# Patient Record
Sex: Female | Born: 1945 | Race: Black or African American | Hispanic: No | Marital: Single | Smoking: Never smoker
Health system: Southern US, Community
[De-identification: ages and names within clinical notes are randomized; demographics above are authoritative.]

## PROBLEM LIST (undated history)

## (undated) DIAGNOSIS — D649 Anemia, unspecified: Secondary | ICD-10-CM

## (undated) DIAGNOSIS — E785 Hyperlipidemia, unspecified: Secondary | ICD-10-CM

## (undated) DIAGNOSIS — G5600 Carpal tunnel syndrome, unspecified upper limb: Secondary | ICD-10-CM

## (undated) DIAGNOSIS — K3184 Gastroparesis: Secondary | ICD-10-CM

## (undated) DIAGNOSIS — N189 Chronic kidney disease, unspecified: Secondary | ICD-10-CM

## (undated) DIAGNOSIS — K8020 Calculus of gallbladder without cholecystitis without obstruction: Secondary | ICD-10-CM

## (undated) DIAGNOSIS — I10 Essential (primary) hypertension: Secondary | ICD-10-CM

## (undated) DIAGNOSIS — I739 Peripheral vascular disease, unspecified: Secondary | ICD-10-CM

## (undated) DIAGNOSIS — M719 Bursopathy, unspecified: Secondary | ICD-10-CM

## (undated) DIAGNOSIS — K56609 Unspecified intestinal obstruction, unspecified as to partial versus complete obstruction: Secondary | ICD-10-CM

## (undated) DIAGNOSIS — M6740 Ganglion, unspecified site: Secondary | ICD-10-CM

## (undated) DIAGNOSIS — I2510 Atherosclerotic heart disease of native coronary artery without angina pectoris: Secondary | ICD-10-CM

## (undated) DIAGNOSIS — F419 Anxiety disorder, unspecified: Secondary | ICD-10-CM

## (undated) DIAGNOSIS — K219 Gastro-esophageal reflux disease without esophagitis: Secondary | ICD-10-CM

## (undated) DIAGNOSIS — G4730 Sleep apnea, unspecified: Secondary | ICD-10-CM

## (undated) DIAGNOSIS — E039 Hypothyroidism, unspecified: Secondary | ICD-10-CM

## (undated) DIAGNOSIS — K222 Esophageal obstruction: Secondary | ICD-10-CM

## (undated) DIAGNOSIS — M1990 Unspecified osteoarthritis, unspecified site: Secondary | ICD-10-CM

## (undated) HISTORY — PX: COLON RESECTION: SHX5231

## (undated) HISTORY — DX: Atherosclerotic heart disease of native coronary artery without angina pectoris: I25.10

## (undated) HISTORY — PX: UPPER GASTROINTESTINAL ENDOSCOPY: SHX188

## (undated) HISTORY — DX: Hyperlipidemia, unspecified: E78.5

## (undated) HISTORY — DX: Hypothyroidism, unspecified: E03.9

## (undated) HISTORY — PX: DILATION AND CURETTAGE OF UTERUS: SHX78

## (undated) HISTORY — PX: COLON SURGERY: SHX602

## (undated) HISTORY — DX: Chronic kidney disease, unspecified: N18.9

## (undated) HISTORY — DX: Essential (primary) hypertension: I10

## (undated) HISTORY — DX: Morbid (severe) obesity due to excess calories: E66.01

## (undated) HISTORY — DX: Bursopathy, unspecified: M71.9

## (undated) HISTORY — DX: Gastroparesis: K31.84

## (undated) HISTORY — DX: Gastro-esophageal reflux disease without esophagitis: K21.9

## (undated) HISTORY — DX: Calculus of gallbladder without cholecystitis without obstruction: K80.20

## (undated) HISTORY — DX: Anemia, unspecified: D64.9

## (undated) HISTORY — DX: Unspecified intestinal obstruction, unspecified as to partial versus complete obstruction: K56.609

## (undated) HISTORY — DX: Ganglion, unspecified site: M67.40

## (undated) HISTORY — PX: NISSEN FUNDOPLICATION: SHX2091

## (undated) HISTORY — PX: ABDOMINAL HYSTERECTOMY: SHX81

## (undated) HISTORY — DX: Esophageal obstruction: K22.2

## (undated) HISTORY — DX: Unspecified osteoarthritis, unspecified site: M19.90

## (undated) HISTORY — PX: HAND SURGERY: SHX662

## (undated) HISTORY — DX: Carpal tunnel syndrome, unspecified upper limb: G56.00

## (undated) HISTORY — DX: Peripheral vascular disease, unspecified: I73.9

## (undated) HISTORY — DX: Anxiety disorder, unspecified: F41.9

## (undated) HISTORY — DX: Sleep apnea, unspecified: G47.30

## (undated) HISTORY — PX: GASTROSTOMY W/ FEEDING TUBE: SUR642

## (undated) HISTORY — PX: OTHER SURGICAL HISTORY: SHX169

---

## 1995-05-02 DIAGNOSIS — K56609 Unspecified intestinal obstruction, unspecified as to partial versus complete obstruction: Secondary | ICD-10-CM

## 1995-05-02 HISTORY — DX: Unspecified intestinal obstruction, unspecified as to partial versus complete obstruction: K56.609

## 2002-01-07 ENCOUNTER — Ambulatory Visit (HOSPITAL_COMMUNITY): Admission: RE | Admit: 2002-01-07 | Discharge: 2002-01-07 | Payer: Self-pay | Admitting: Internal Medicine

## 2003-05-02 HISTORY — PX: COLON RESECTION: SHX5231

## 2010-12-13 DIAGNOSIS — R079 Chest pain, unspecified: Secondary | ICD-10-CM

## 2010-12-16 ENCOUNTER — Inpatient Hospital Stay (HOSPITAL_COMMUNITY)
Admission: AD | Admit: 2010-12-16 | Discharge: 2010-12-22 | DRG: 287 | Disposition: A | Discharge status: Home / Self Care | Payer: Medicare PPO | Source: Other Acute Inpatient Hospital | Attending: Cardiovascular Disease | Admitting: Cardiovascular Disease

## 2010-12-16 DIAGNOSIS — E039 Hypothyroidism, unspecified: Secondary | ICD-10-CM | POA: Diagnosis present

## 2010-12-16 DIAGNOSIS — Z794 Long term (current) use of insulin: Secondary | ICD-10-CM

## 2010-12-16 DIAGNOSIS — D649 Anemia, unspecified: Secondary | ICD-10-CM | POA: Diagnosis present

## 2010-12-16 DIAGNOSIS — R0789 Other chest pain: Principal | ICD-10-CM | POA: Diagnosis present

## 2010-12-16 DIAGNOSIS — E669 Obesity, unspecified: Secondary | ICD-10-CM | POA: Diagnosis present

## 2010-12-16 DIAGNOSIS — R9430 Abnormal result of cardiovascular function study, unspecified: Secondary | ICD-10-CM

## 2010-12-16 DIAGNOSIS — I2510 Atherosclerotic heart disease of native coronary artery without angina pectoris: Secondary | ICD-10-CM | POA: Diagnosis present

## 2010-12-16 DIAGNOSIS — Z933 Colostomy status: Secondary | ICD-10-CM

## 2010-12-16 DIAGNOSIS — E119 Type 2 diabetes mellitus without complications: Secondary | ICD-10-CM | POA: Diagnosis present

## 2010-12-16 DIAGNOSIS — E785 Hyperlipidemia, unspecified: Secondary | ICD-10-CM | POA: Diagnosis present

## 2010-12-16 DIAGNOSIS — R079 Chest pain, unspecified: Secondary | ICD-10-CM

## 2010-12-16 DIAGNOSIS — K259 Gastric ulcer, unspecified as acute or chronic, without hemorrhage or perforation: Secondary | ICD-10-CM | POA: Diagnosis present

## 2010-12-16 DIAGNOSIS — Z7982 Long term (current) use of aspirin: Secondary | ICD-10-CM

## 2010-12-16 DIAGNOSIS — Z79899 Other long term (current) drug therapy: Secondary | ICD-10-CM

## 2010-12-16 DIAGNOSIS — I10 Essential (primary) hypertension: Secondary | ICD-10-CM | POA: Diagnosis present

## 2010-12-16 LAB — CARDIAC PANEL(CRET KIN+CKTOT+MB+TROPI)
Relative Index: INVALID (ref 0.0–2.5)
Total CK: 78 U/L (ref 7–177)
Troponin I: <0.3 ng/mL (ref <0.30)

## 2010-12-16 LAB — GLUCOSE, CAPILLARY: Glucose-Capillary: 218 mg/dL — ABNORMAL HIGH (ref 70–99)

## 2010-12-17 DIAGNOSIS — I200 Unstable angina: Secondary | ICD-10-CM

## 2010-12-17 LAB — TSH: TSH: 0.17 u[IU]/mL — ABNORMAL LOW (ref 0.350–4.500)

## 2010-12-17 LAB — GLUCOSE, CAPILLARY: Glucose-Capillary: 158 mg/dL — ABNORMAL HIGH (ref 70–99)

## 2010-12-18 LAB — CBC
HCT: 25.5 % — ABNORMAL LOW (ref 36.0–46.0)
MCHC: 32.2 g/dL (ref 30.0–36.0)
Platelets: 183 10*3/uL (ref 150–400)
RDW: 13.6 % (ref 11.5–15.5)
WBC: 8.9 10*3/uL (ref 4.0–10.5)

## 2010-12-18 LAB — IRON AND TIBC
Iron: 64 ug/dL (ref 42–135)
Saturation Ratios: 15 % — ABNORMAL LOW (ref 20–55)
TIBC: 413 ug/dL (ref 250–470)

## 2010-12-18 LAB — PROTIME-INR
INR: 0.98 (ref 0.00–1.49)
Prothrombin Time: 13.2 s (ref 11.6–15.2)

## 2010-12-18 LAB — BASIC METABOLIC PANEL
CO2: 27 mEq/L (ref 19–32)
Calcium: 9.9 mg/dL (ref 8.4–10.5)
Creatinine, Ser: 1.16 mg/dL — ABNORMAL HIGH (ref 0.50–1.10)
GFR calc non Af Amer: 47 mL/min — ABNORMAL LOW (ref >60)
Sodium: 141 mEq/L (ref 135–145)

## 2010-12-18 LAB — VITAMIN B12: Vitamin B-12: 219 pg/mL (ref 211–911)

## 2010-12-18 LAB — GLUCOSE, CAPILLARY
Glucose-Capillary: 175 mg/dL — ABNORMAL HIGH (ref 70–99)
Glucose-Capillary: 135 mg/dL — ABNORMAL HIGH (ref 70–99)

## 2010-12-18 LAB — OCCULT BLOOD X 1 CARD TO LAB, STOOL: Fecal Occult Bld: POSITIVE

## 2010-12-19 DIAGNOSIS — I2510 Atherosclerotic heart disease of native coronary artery without angina pectoris: Secondary | ICD-10-CM

## 2010-12-19 DIAGNOSIS — R195 Other fecal abnormalities: Secondary | ICD-10-CM

## 2010-12-19 DIAGNOSIS — D509 Iron deficiency anemia, unspecified: Secondary | ICD-10-CM

## 2010-12-19 DIAGNOSIS — R1012 Left upper quadrant pain: Secondary | ICD-10-CM

## 2010-12-19 LAB — CBC
Hemoglobin: 8 g/dL — ABNORMAL LOW (ref 12.0–15.0)
MCH: 28.7 pg (ref 26.0–34.0)
MCHC: 31 g/dL (ref 30.0–36.0)
MCV: 92.5 fL (ref 78.0–100.0)
Platelets: 189 10*3/uL (ref 150–400)
RBC: 2.79 MIL/uL — ABNORMAL LOW (ref 3.87–5.11)

## 2010-12-19 LAB — GLUCOSE, CAPILLARY
Glucose-Capillary: 105 mg/dL — ABNORMAL HIGH (ref 70–99)
Glucose-Capillary: 109 mg/dL — ABNORMAL HIGH (ref 70–99)

## 2010-12-19 LAB — TYPE AND SCREEN: ABO/RH(D): O POS

## 2010-12-19 LAB — ABO/RH: ABO/RH(D): O POS

## 2010-12-20 DIAGNOSIS — K250 Acute gastric ulcer with hemorrhage: Secondary | ICD-10-CM

## 2010-12-20 LAB — GLUCOSE, CAPILLARY
Glucose-Capillary: 150 mg/dL — ABNORMAL HIGH (ref 70–99)
Glucose-Capillary: 155 mg/dL — ABNORMAL HIGH (ref 70–99)
Glucose-Capillary: 175 mg/dL — ABNORMAL HIGH (ref 70–99)
Glucose-Capillary: 143 mg/dL — ABNORMAL HIGH (ref 70–99)
Glucose-Capillary: 176 mg/dL — ABNORMAL HIGH (ref 70–99)

## 2010-12-20 LAB — CBC
MCH: 29.5 pg (ref 26.0–34.0)
MCHC: 32 g/dL (ref 30.0–36.0)
Platelets: 195 10*3/uL (ref 150–400)

## 2010-12-20 LAB — BASIC METABOLIC PANEL
CO2: 27 mEq/L (ref 19–32)
Calcium: 9.5 mg/dL (ref 8.4–10.5)
Chloride: 107 mEq/L (ref 96–112)
Potassium: 3.8 mEq/L (ref 3.5–5.1)
Sodium: 142 mEq/L (ref 135–145)

## 2010-12-20 NOTE — Cardiovascular Report (Signed)
  NAMEILSE, BILLMAN NO.:  [id number]  MEDICAL RECORD NO.:  [id number]  LOCATION:                                 FACILITY:  PHYSICIAN:  Rollene Rotunda, MD, FACCDATE OF BIRTH:  Jan 15, 1946  DATE OF PROCEDURE: DATE OF DISCHARGE:                           CARDIAC CATHETERIZATION   PRIMARY:  Kirstie Peri, MD  CARDIOLOGIST:  Lorine Bears, MD  REASON FOR PRESENTATION:  Evaluate the patient with chest discomfort. She did have an stress perfusion study that suggested possible anterior ischemia versus breast attenuation.  PROCEDURAL NOTE:  Left heart catheterization was performed via the right radial artery, the artery was cannulated using the wall puncture.  A 5- French arterial sheath was inserted via the Seldinger technique. Preformed Judkins pigtail catheter was utilized.  The patient tolerated the procedure and left the lab in stable condition.  RESULTS:  Hemodynamics:  LV 144/10, AO 141/64.  Coronaries:  Left main was normal.  The LAD had 60% proximal stenosis. There was apical diffuse disease.  First diagonal was moderate sized and normal.  The circumflex in the AV groove had mid to distal luminal irregularities.  The distal vessel then had high-grade disease following a second obtuse marginal.  First obtuse marginal was large and normal. Second obtuse marginal had ostial 25% stenosis.  The right coronary artery is a very large dominant vessel.  There are proximal 50% stenosis and luminal irregularities.  The PDA was large and normal.  Left ventriculogram:  Left ventriculogram was obtained in the RAO projection.  The EF was approximately 60% with normal wall motion.  CONCLUSION:  Moderate three-vessel coronary artery disease.  Normal left ventricular function.  PLAN:  The patient's pain is somewhat atypical.  There has been no objective evidence of ischemia.  Stress perfusion study suggested possible ischemia, but could not exclude attenuation.  She  is quite anemic with a drop in hemoglobin.  Guaiac of colostomy contents has been positive.  There were iron studies that are only minimally low. I will get GI to evaluate her as it is likely the pain could be GI etiology.  She is going to need aggressive risk reduction of her coronary artery disease and then we can reconsider reevaluation particularly of her LAD lesion in the future.     Rollene Rotunda, MD, [facility]     JH/MEDQ  D:  12/19/2010  T:  12/19/2010  Job:  [id number]  cc:   Kirstie Peri, MD Lorine Bears, MD  Electronically Signed by Rollene Rotunda MD [facility] on 12/20/2010 02:23:40 PM

## 2010-12-21 ENCOUNTER — Encounter: Payer: Self-pay | Admitting: Internal Medicine

## 2010-12-21 LAB — CBC
MCH: 29.2 pg (ref 26.0–34.0)
MCHC: 31.6 g/dL (ref 30.0–36.0)
MCV: 92.4 fL (ref 78.0–100.0)
Platelets: 236 10*3/uL (ref 150–400)
RBC: 2.91 MIL/uL — ABNORMAL LOW (ref 3.87–5.11)

## 2010-12-21 LAB — BASIC METABOLIC PANEL
BUN: 22 mg/dL (ref 6–23)
CO2: 27 mEq/L (ref 19–32)
Chloride: 103 mEq/L (ref 96–112)
Creatinine, Ser: 1.46 mg/dL — ABNORMAL HIGH (ref 0.50–1.10)
GFR calc Af Amer: 44 mL/min — ABNORMAL LOW (ref >60)

## 2010-12-21 LAB — GLUCOSE, CAPILLARY

## 2010-12-22 DIAGNOSIS — R079 Chest pain, unspecified: Secondary | ICD-10-CM

## 2010-12-22 LAB — CBC
HCT: 25.1 % — ABNORMAL LOW (ref 36.0–46.0)
Hemoglobin: 8 g/dL — ABNORMAL LOW (ref 12.0–15.0)
WBC: 7.4 10*3/uL (ref 4.0–10.5)

## 2010-12-22 LAB — GLUCOSE, CAPILLARY: Glucose-Capillary: 174 mg/dL — ABNORMAL HIGH (ref 70–99)

## 2011-01-11 ENCOUNTER — Other Ambulatory Visit: Payer: Self-pay | Admitting: *Deleted

## 2011-01-13 ENCOUNTER — Telehealth: Payer: Self-pay | Admitting: Internal Medicine

## 2011-01-24 ENCOUNTER — Ambulatory Visit (INDEPENDENT_AMBULATORY_CARE_PROVIDER_SITE_OTHER): Payer: Medicare PPO | Admitting: Internal Medicine

## 2011-01-24 ENCOUNTER — Encounter: Payer: Self-pay | Admitting: Internal Medicine

## 2011-01-24 VITALS — BP 124/62 | HR 60 | Ht 67.0 in | Wt 270.2 lb

## 2011-01-24 DIAGNOSIS — E119 Type 2 diabetes mellitus without complications: Secondary | ICD-10-CM

## 2011-01-24 DIAGNOSIS — D649 Anemia, unspecified: Secondary | ICD-10-CM

## 2011-01-24 DIAGNOSIS — K253 Acute gastric ulcer without hemorrhage or perforation: Secondary | ICD-10-CM

## 2011-01-24 NOTE — Progress Notes (Signed)
HISTORY OF PRESENT ILLNESS:  Jenna Diaz is a 65 y.o. female with multiple significant medical problems including morbid obesity, coronary artery disease, renal insufficiency, hyperlipidemia, hypothyroidism, hypertension, diabetes mellitus, and anemia. She presents today for post hospital followup. She was hospitalized between 12/16/2010 and 12/22/2010 regarding multifactorial chest pain. See the discharge summary. She is status post total abdominal colectomy elsewhere for unclear reasons. During her hospital stay, she complained of left upper quadrant abdominal pain. In addition to anemia, she had Hemoccult-positive stool. Ileoscopy was unremarkable. Upper endoscopy revealed a large antral ulcer (2 cm). Testing for Helicobacter pylori was negative. Her risk factor was chronic NSAID use. She was treated with pantoprazole 40 mg twice a day and avoidance of all NSAIDs, except baby aspirin for cardiac protection purposes. She has been compliant. Her abdominal pain has resolved. No active GI complaints. Multiple non-GI complaints. She saw her family position the other day. Review of outside laboratories from September 7 5 the patient to be anemic with a hemoglobin of 8.9. She was placed on iron supplement.  REVIEW OF SYSTEMS:  All non-GI ROS negative except for fatigue, exertional shortness of breath, sinus and allergy trouble, arthritis, back pain, headaches, itching, voice change, urinary frequency, ankle edema.  Past Medical History  Diagnosis Date  . Diabetes mellitus   . Essential hypertension   . Hypothyroidism   . Anemia   . GERD (gastroesophageal reflux disease)   . Hyperlipemia   . Potassium deficiency   . Bowel obstruction 1997  . Osteoarthritis   . Chronic kidney disease     questionable  . Gastric ulcer   . CAD (coronary artery disease)     Past Surgical History  Procedure Date  . Colon resection   . Abdominal hysterectomy   . Hand surgery     cyst on left hand  . Nissen  fundoplication     questionable - pt unsure of surgical name  . Gastrostomy w/ feeding tube     Social History Jenna Diaz  reports that she has never smoked. She has never used smokeless tobacco. She reports that she does not drink alcohol or use illicit drugs.  family history includes Diabetes in her sister; Esophageal cancer in her father; Heart disease in her brother, maternal grandmother, mother, and paternal grandmother; and Ovarian cancer in her sister.  No Known Allergies     PHYSICAL EXAMINATION:  Vital signs: BP 124/62  Pulse 60  Ht 5\' 7"  (1.702 m)  Wt 270 lb 3.2 oz (122.562 kg)  BMI 42.32 kg/m2 General: Obese, Well-developed, well-nourished, no acute distress HEENT: Sclerae are anicteric, conjunctiva pink. Oral mucosa intact Lungs: Clear Heart: Regular Abdomen: soft, obese, nontender, nondistended, no obvious ascites, no peritoneal signs, normal bowel sounds. No organomegaly. Ostomy unremarkable Extremities: No edema Psychiatric: alert and oriented x3. Cooperative    ASSESSMENT:  #1. Large gastric ulcer. Abdominal pain resolved on twice a day PPI. Still on low dose aspirin but no additional NSAIDs. H. pylori testing negative #2. Status post total abdominal colectomy, elsewhere. Reasons unclear. #3. Multiple significant medical problems including coronary artery disease, diabetes, and obesity #4. Anemia with associated symptoms   PLAN:  #1. Continue twice a day PPI. Nexium samples provided until she can pick up her recent pantoprazole prescription #2. Continue to avoid all NSAIDs except for baby aspirin #3. Schedule upper endoscopy to assess for ulcer healing in about 4 weeks. The patient is high-risk given her body habitus and comorbidities.The nature of the procedure, as  well as the risks, benefits, and alternatives were carefully and thoroughly reviewed with the patient. Ample time for discussion and questions allowed. The patient understood, was  satisfied, and agreed to proceed.  #4. Hold diabetic medications the morning of the exam until resuming by mouth intake #5. Sedation with propofol and CRNA supervision given her body habitus and comorbidities. She understands #6. Continue to work with your PCP on non-GI complaints #7. If anemia persists despite iron, consider hematology opinion. I will leave this to the patient's PCP, Dr. Sherryll Burger

## 2011-01-24 NOTE — Patient Instructions (Addendum)
EGD [facility] Propoful 02/21/11  9:30 am arrive at 8:30 am Hold Insulin the morning of procedure. Nexium samples given in office but continue Protonix when prescription is available.

## 2011-01-27 ENCOUNTER — Encounter: Payer: Self-pay | Admitting: Cardiovascular Disease

## 2011-01-27 ENCOUNTER — Ambulatory Visit (INDEPENDENT_AMBULATORY_CARE_PROVIDER_SITE_OTHER): Payer: Medicare PPO | Admitting: Physician Assistant

## 2011-01-27 VITALS — BP 144/76 | HR 56 | Ht 67.0 in | Wt 270.0 lb

## 2011-01-27 DIAGNOSIS — E039 Hypothyroidism, unspecified: Secondary | ICD-10-CM | POA: Insufficient documentation

## 2011-01-27 DIAGNOSIS — E785 Hyperlipidemia, unspecified: Secondary | ICD-10-CM | POA: Insufficient documentation

## 2011-01-27 DIAGNOSIS — I10 Essential (primary) hypertension: Secondary | ICD-10-CM | POA: Insufficient documentation

## 2011-01-27 DIAGNOSIS — E119 Type 2 diabetes mellitus without complications: Secondary | ICD-10-CM

## 2011-01-27 DIAGNOSIS — D649 Anemia, unspecified: Secondary | ICD-10-CM | POA: Insufficient documentation

## 2011-01-27 DIAGNOSIS — Z79899 Other long term (current) drug therapy: Secondary | ICD-10-CM

## 2011-01-27 DIAGNOSIS — I2510 Atherosclerotic heart disease of native coronary artery without angina pectoris: Secondary | ICD-10-CM

## 2011-01-27 DIAGNOSIS — E1159 Type 2 diabetes mellitus with other circulatory complications: Secondary | ICD-10-CM | POA: Insufficient documentation

## 2011-01-27 MED ORDER — AMLODIPINE BESYLATE 5 MG PO TABS: 5.0000 mg | ORAL_TABLET | Freq: Every day | ORAL | Status: DC

## 2011-01-27 MED ORDER — NITROGLYCERIN 0.4 MG SL SUBL: 0.4000 mg | SUBLINGUAL_TABLET | SUBLINGUAL | Status: DC | PRN

## 2011-01-27 NOTE — Assessment & Plan Note (Addendum)
Patient is still experiencing exertional angina, but less often than with recent presentation. Therefore, will add amlodipine 5 mg daily for anti-anginal, and antihypertensive, benefit. Will also provide prn nitroglycerin. Will schedule early return visit with Dr. Kirke Corin within a few weeks, with plans to repeat coronary angiography within the next few months, this time utilizing FFR for reassessment of LAD lesion. This is in reference to noted anterior wall ischemia on nuclear imaging.  Patient seen and examined in conjunction with Dr. Kirke Corin.

## 2011-01-27 NOTE — Assessment & Plan Note (Signed)
Followup with Dr. Yancey Flemings, as scheduled, including recommended repeat EGD for noted gastric ulcer. Recent followup CBC notable for hemoglobin 8.9, down from 9.9. Patient notes no BR BPR, but occasional dark stools. She is tolerating low-dose aspirin, which was approved by the GI team.

## 2011-01-27 NOTE — Patient Instructions (Signed)
Follow up as scheduled. Start Norvasc (amlodipine) 5 mg daily. Use Nitroglycerin as needed/as directed for chest pain. Your physician recommends that you go to the [facility] for lab work: FLP/LFT/TSH. Do not eat or drink after midnight. If the results of your test are normal or stable, you will receive a letter. If they are abnormal, the nurse will contact you by phone.

## 2011-01-27 NOTE — Assessment & Plan Note (Signed)
We'll order fasting lipid profile for reassessment of lipid status. Patient is currently on a statin. Recommended aggressive management with target LDL 70 or less, if feasible.

## 2011-01-27 NOTE — Assessment & Plan Note (Signed)
Patient had a TSH level of 0.17 at [facility]. Will repeat this level, then forward result to Dr. Sherryll Burger, for ongoing monitoring and management.

## 2011-01-27 NOTE — Progress Notes (Signed)
HPI: Patient presents for post hospital followup, and to establish here in our [place] clinic.  She was initially seen in consultation here at [facility], by Dr Kirke Corin, following presentation with CP. She R/O for MI, but had had a recent OP Cardiolite, reviewed by Dr Diona Browner, suggestive of anterior ischemia with NL LVF. She was referred for a catheterization, which yielded moderate 3v CAD; EF 60%, NL wall motion. Specifically, there was a 60% pLAD lesion, and a 50% pRCA stenosis.  She also was found to have guaic positive anemia, and was referred to GI. They proceeded with EGD/Colonoscopy, revealing an antral ulcer, but no overt bleeding. She was cleared to be treated with ASA 81 mg daily. Hemoglobin 8.0, at [facility], with f/u 8.9, here at [facility], per Dr Sherryll Burger.  Clinically, she continues to have exertional CP, but less often and less severe. She also continues to have "dark" stools, but no definite melena. She denies BRBPR. She also continues to experience DOE, which is not new, but no worsening of her chronic LE edema.  No Known Allergies  Current Outpatient Prescriptions on File Prior to Visit  Medication Sig Dispense Refill  . aspirin EC 81 MG tablet Take 81 mg by mouth daily.        . fluticasone-salmeterol (ADVAIR HFA) 115-21 MCG/ACT inhaler Inhale 2 puffs into the lungs 2 (two) times daily.        . furosemide (LASIX) 40 MG tablet Take by mouth. Take 1/2 tablet by mouth once daily as needed       . gabapentin (NEURONTIN) 100 MG capsule Take 100 mg by mouth 3 (three) times daily.        . insulin aspart (NOVOLOG FLEXPEN) 100 UNIT/ML injection Inject 12 Units into the skin 2 (two) times daily.        . insulin glargine (LANTUS) 100 UNIT/ML injection Inject into the skin at bedtime. Take 45 units every morning and 40 units every evening       . iron polysaccharides (NU-IRON) 150 MG capsule Take 150 mg by mouth daily.        Marland Kitchen levothyroxine (SYNTHROID, LEVOTHROID) 150 MCG tablet Take 150 mcg by mouth  daily.        Marland Kitchen lisinopril (PRINIVIL,ZESTRIL) 20 MG tablet Take 20 mg by mouth daily.        . pantoprazole (PROTONIX) 40 MG tablet Take 40 mg by mouth 2 (two) times daily.       . potassium chloride (KLOR-CON) 10 MEQ CR tablet Take 10 mEq by mouth daily.        . pravastatin (PRAVACHOL) 20 MG tablet Take 20 mg by mouth daily.        . traMADol (ULTRAM) 50 MG tablet Take 50 mg by mouth 3 (three) times daily.          Past Medical History  Diagnosis Date  . Diabetes mellitus   . Essential hypertension   . Hypothyroidism   . Anemia   . GERD (gastroesophageal reflux disease)   . Hyperlipemia   . Potassium deficiency   . Bowel obstruction 1997  . Osteoarthritis   . Chronic kidney disease     questionable  . Gastric ulcer   . CAD (coronary artery disease)     Past Surgical History  Procedure Date  . Colon resection   . Abdominal hysterectomy   . Hand surgery     cyst on left hand  . Nissen fundoplication     questionable - pt unsure  of surgical name  . Gastrostomy w/ feeding tube     History   Social History  . Marital Status: Single    Spouse Name: N/A    Number of Children: 3  . Years of Education: N/A   Occupational History  . disability    Social History Main Topics  . Smoking status: Never Smoker   . Smokeless tobacco: Never Used  . Alcohol Use: No  . Drug Use: No  . Sexually Active: Not on file   Other Topics Concern  . Not on file   Social History Narrative  . No narrative on file    Family History  Problem Relation Age of Onset  . Heart disease Mother     MI  . Heart disease Maternal Grandmother   . Heart disease Paternal Grandmother   . Ovarian cancer Sister   . Diabetes Sister     x3  . Heart disease Brother   . Esophageal cancer Father     ROS: Negative for orthopnea, PND, palpitations, presyncope/syncope, claudication, reflux, hematuria, hematochezia, or melena. Remaining systems reviewed, and are negative.   PHYSICAL EXAM:  BP  144/76  Pulse 56  Ht 5\' 7"  (1.702 m)  Wt 270 lb (122.471 kg)  BMI 42.29 kg/m2  GENERAL: well-nourished, well-developed; NAD HEENT: NCAT, PERRLA, EOMI; sclera clear; no xanthelasma NECK: palpable bilateral carotid pulses, no bruits; no JVD; no TM LUNGS: CTA bilaterally CARDIAC: RRR (S1, S2); no significant murmurs; no rubs or gallops ABDOMEN: soft, non-tender; intact BS EXTREMETIES: intact distal pulses; no significant peripheral edema SKIN: warm/dry; no obvious rash/lesions MUSCULOSKELETAL: no joint deformity NEURO: no focal deficit; NL affect   EKG:    ASSESSMENT & PLAN:

## 2011-01-27 NOTE — Assessment & Plan Note (Signed)
Will add amlodipine 5 mg daily.

## 2011-02-09 NOTE — Discharge Summary (Signed)
Jenna Diaz, Jenna Diaz NO.:  [id number]  MEDICAL RECORD NO.:  [id number]  LOCATION:  3712                         FACILITY:  [facility]  PHYSICIAN:  Rollene Rotunda, MD, FACCDATE OF BIRTH:  [birth year]  DATE OF ADMISSION:  12/16/2010 DATE OF DISCHARGE:  12/22/2010                              DISCHARGE SUMMARY   PRIMARY CARDIOLOGIST:  Lorine Bears, MD  PRIMARY CARE DOCTOR:  Kirstie Peri, MD.  CONSULTING GASTROENTEROLOGIST:  Wilhemina Bonito. Marina Goodell, MD  DISCHARGE DIAGNOSIS:  Chest pain - multifactorial.  SECONDARY DIAGNOSES: 1. Gastric ulcer found on EGD this admission. 2. Moderate multivessel and small vessel coronary artery disease - by     catheterization this admission. 3. Normocytic anemia, guaiac-positive stool. 4. Hypertension. 5. Hyperlipidemia. 6. Type 2 diabetes mellitus. 7. Obesity. 8. Hypothyroidism. 9. History of small-bowel obstruction status post colectomy and     colostomy. 10.Status post hysterectomy.  ALLERGIES:  No known drug allergies.  PROCEDURES: 1. Left heart rate catheterization performed on December 19, 2010,     revealing a: Left main:  Normal LAD 60% proximal with diffuse apical disease. Left circumflex:  Mild irregularities proximally and 91% stenosis more distally.  At which point, it was a small vessel. Right coronary artery was large and dominant with a proximal 50% stenosis.  PDA was normal.  EF was 60%. 1. EGD performed on December 20, 2010, revealing a 2 cm diameter deep     clean-based ulcer in the antrum, but otherwise normal stomach.  CLO     biopsy was negative. 2. Colonoscopy performed on December 20, 2010, showing normal stoma with     no residual colon.  The terminal ileum appeared normal x 25 cm.  HISTORY OF PRESENT ILLNESS:  A 65 year old female with the above problem list who was admitted to [facility] on December 16, 2010, secondary to complaints of chest and abdominal pain.  The patient had negative cardiac enzymes  and underwent stress testing which showed partially reversible anterior wall defect with normal EF.  She was transferred to [facility] for evaluation.  HOSPITAL COURSE:  The patient underwent diagnostic catheterization August 20 showing moderate diffuse multivessel disease without specific culprit for intervention.  She had normal LV function and medical therapy was recommended.  This patient had been anemic with hemoglobin and hematocrits remain in the 8 and 25 range with guaiac-positive stool, Gastroenterology consult was obtained and decision was made to pursue EGD and colonoscopy.  The patient underwent EGD and colonoscopy on August 21 revealing a 2 cm diameter deep clean-based ulcer found in the antrum of the stomach.  CLO biopsy was taken and has returned negative.  The patient has been placed on b.i.d. PPI and also has been warned against using NSAIDs.  It was felt safe to use aspirin 81 mg daily.  The patient's colonoscopy showed no evidence of bleeding as outlined above.  The patient has had no recurrence of chest pain.  Following a colonoscopy, she did have some abdominal discomfort, however, this is resolving and her anemia has been stable.  She will be discharged home today in good condition.  DISCHARGE LABS:  Hemoglobin 8.0, hematocrit 25.1, WBC 7.4 , platelets 220, INR 0.98.  Sodium 136, potassium 4.2, chloride 103, CO2 27, BUN 22, creatinine 1.46, glucose 176.  TSH 0.170.  Iron 64, TIBC 413, B12 219, folate 10.2, ferritin 19.  Fecal occult blood was positive.  CLO screen was urease negative.  DISPOSITION:  The patient will be discharged home today in good condition.  FOLLOWUP PLANS AND APPOINTMENTS:  The patient will follow up with Dr. Yancey Flemings on September 25 at 10:15 a.m. The patient will follow up with Dr. Kirke Corin in our unit office on September 28 at 2:30 p.m.  She will follow up with Dr. Sherryll Burger as previously scheduled and will have a CBC checked in 1  week.  DISCHARGE MEDICATIONS: 1. Acetaminophen 500 mg 1-2 tablets q.6 h p.r.n. 2. Protonix 40 mg b.i.d. 3. Aspirin 81 mg daily. 4. Lantus 40 units b.i.d. 5. Levothyroxine 150 mcg daily. 6. Lisinopril 20 mg daily. 7. Neurontin 100 mg t.i.d. 8. NovoLog 70/30 12 units b.i.d. 9. Potassium chloride 10 mEq daily. 10.Pravachol 20 mg daily.  OUTSTANDING LABS AND STUDIES:  Follow up CPT in 1 week.  DURATION DISCHARGE ENCOUNTER:  45 minutes including physician time.     Nicolasa Ducking, ANP   ______________________________ Rollene Rotunda, MD, [facility]    CB/MEDQ  D:  12/22/2010  T:  12/22/2010  Job:  [id number]  cc:   Wilhemina Bonito. Marina Goodell, MD Kirstie Peri, MD  Electronically Signed by Nicolasa Ducking ANP on 12/22/2010 03:04:57 PM Electronically Signed by Rollene Rotunda MD [facility] on 02/09/2011 01:29:36 PM

## 2011-02-17 ENCOUNTER — Telehealth: Payer: Self-pay | Admitting: *Deleted

## 2011-02-17 MED ORDER — PRAVASTATIN SODIUM 40 MG PO TABS: 40.0000 mg | ORAL_TABLET | Freq: Every evening | ORAL | Status: DC

## 2011-02-17 NOTE — Telephone Encounter (Signed)
Pt notified of results and verbalized understanding  

## 2011-02-17 NOTE — Telephone Encounter (Signed)
Notes Recorded by Gene Serpe, PA on 01/31/2011 at 11:24 AM LDL 99. Increase Pravachol to 40 daily, then fasting lipid panel/LFTs in 12 weeks

## 2011-02-21 ENCOUNTER — Other Ambulatory Visit: Payer: Self-pay | Admitting: Internal Medicine

## 2011-02-21 ENCOUNTER — Ambulatory Visit (AMBULATORY_SURGERY_CENTER): Payer: Medicare PPO | Admitting: Internal Medicine

## 2011-02-21 ENCOUNTER — Encounter: Payer: Self-pay | Admitting: Internal Medicine

## 2011-02-21 VITALS — BP 142/75 | HR 60 | Temp 96.4°F | Resp 23 | Ht 67.0 in | Wt 270.0 lb

## 2011-02-21 DIAGNOSIS — K259 Gastric ulcer, unspecified as acute or chronic, without hemorrhage or perforation: Secondary | ICD-10-CM

## 2011-02-21 DIAGNOSIS — K253 Acute gastric ulcer without hemorrhage or perforation: Secondary | ICD-10-CM

## 2011-02-21 DIAGNOSIS — K2940 Chronic atrophic gastritis without bleeding: Secondary | ICD-10-CM

## 2011-02-21 MED ORDER — SODIUM CHLORIDE 0.9 % IV SOLN: 500.0000 mL | INTRAVENOUS | Status: DC

## 2011-02-21 NOTE — Progress Notes (Signed)
Patient blood glucose recheck 126. Alert oriented. Skin warm and dry. No signs or symptoms of hypoglycemia.

## 2011-02-22 ENCOUNTER — Telehealth: Payer: Self-pay | Admitting: *Deleted

## 2011-02-22 NOTE — Telephone Encounter (Signed)
No answer, no identifier on answering machine, no message left for the patient.

## 2011-02-27 ENCOUNTER — Ambulatory Visit (INDEPENDENT_AMBULATORY_CARE_PROVIDER_SITE_OTHER): Payer: Medicare PPO | Admitting: Cardiovascular Disease

## 2011-02-27 ENCOUNTER — Encounter: Payer: Self-pay | Admitting: Cardiovascular Disease

## 2011-02-27 VITALS — BP 117/70 | HR 69 | Resp 18 | Ht 67.0 in | Wt 267.0 lb

## 2011-02-27 DIAGNOSIS — I2510 Atherosclerotic heart disease of native coronary artery without angina pectoris: Secondary | ICD-10-CM

## 2011-02-27 NOTE — Patient Instructions (Signed)
Follow up as scheduled. Your physician recommends that you continue on your current medications as directed. Please refer to the Current Medication list given to you today. 

## 2011-03-02 ENCOUNTER — Encounter: Payer: Self-pay | Admitting: Cardiovascular Disease

## 2011-03-02 NOTE — Assessment & Plan Note (Signed)
The patient continues to have chest pain suggestive of mild angina in spite of aggressive medical therapy. Unfortunately, her gastric ulcer has not resolved completely. She is going to have a followup EGD in December. Once her ulcer is healed, my plan is to proceed with FFR guided revascularization of both right coronary artery and left anterior descending artery. In the meanwhile, I will continue the same cardiac medications.

## 2011-03-02 NOTE — Progress Notes (Signed)
HPI  This is a 65 year old female who is here today for followup visit. She has known history of coronary artery disease with chest pain which is currently being treated medically due to gastric ulcer. She had a recent EGD which showed that the ulcer is healing but has not resolved completely. She is going to have a followup EGD in 2 months. She continues to have mild exertional chest pain which has not worsened. She has been taking her medications regularly. Her anemia improved from before. She also has known history of gallstones on ultrasound. She denies any discomfort in the right upper quadrant.  No Known Allergies   Current Outpatient Prescriptions on File Prior to Visit  Medication Sig Dispense Refill  . amLODipine (NORVASC) 5 MG tablet Take 1 tablet (5 mg total) by mouth daily.  30 tablet  6  . aspirin EC 81 MG tablet Take 81 mg by mouth daily.        . fluticasone-salmeterol (ADVAIR HFA) 115-21 MCG/ACT inhaler Inhale 2 puffs into the lungs 2 (two) times daily.        . furosemide (LASIX) 40 MG tablet Take by mouth. Take 1/2 tablet by mouth once daily as needed       . gabapentin (NEURONTIN) 100 MG capsule Take 100 mg by mouth 3 (three) times daily.        . insulin aspart (NOVOLOG FLEXPEN) 100 UNIT/ML injection Inject 12 Units into the skin 2 (two) times daily.        . insulin glargine (LANTUS) 100 UNIT/ML injection Inject into the skin at bedtime. Take 45 units every morning and 40 units every evening       . iron polysaccharides (NU-IRON) 150 MG capsule Take 150 mg by mouth daily.        Marland Kitchen levothyroxine (SYNTHROID, LEVOTHROID) 150 MCG tablet Take 150 mcg by mouth daily.        Marland Kitchen lisinopril (PRINIVIL,ZESTRIL) 20 MG tablet Take 20 mg by mouth daily.        . nitroGLYCERIN (NITROSTAT) 0.4 MG SL tablet Place 1 tablet (0.4 mg total) under the tongue every 5 (five) minutes as needed for chest pain.  25 tablet  3  . pantoprazole (PROTONIX) 40 MG tablet Take 40 mg by mouth 2 (two) times  daily.       . potassium chloride (KLOR-CON) 10 MEQ CR tablet Take 20 mEq by mouth daily.       . pravastatin (PRAVACHOL) 40 MG tablet Take 1 tablet (40 mg total) by mouth every evening.  30 tablet  6  . traMADol (ULTRAM) 50 MG tablet Take 50 mg by mouth 3 (three) times daily.           Past Medical History  Diagnosis Date  . Diabetes mellitus   . Essential hypertension   . Hypothyroidism   . Anemia   . GERD (gastroesophageal reflux disease)   . Hyperlipemia   . Potassium deficiency   . Bowel obstruction 1997  . Osteoarthritis   . Chronic kidney disease     questionable  . Gastric ulcer   . CAD (coronary artery disease)   . Blood transfusion      Past Surgical History  Procedure Date  . Colon resection   . Abdominal hysterectomy   . Hand surgery     cyst on left hand  . Nissen fundoplication     questionable - pt unsure of surgical name  . Gastrostomy w/ feeding tube   .  Cardiac catheterization 11/2010    moderate 2 vessel CAD. LAD 60%, RCA 50%. Diffuse obstructive disease in distal LCX.      Family History  Problem Relation Age of Onset  . Heart disease Mother     MI  . Heart disease Maternal Grandmother   . Heart disease Paternal Grandmother   . Ovarian cancer Sister   . Diabetes Sister     x3  . Heart disease Brother   . Esophageal cancer Father      History   Social History  . Marital Status: Single    Spouse Name: N/A    Number of Children: 3  . Years of Education: N/A   Occupational History  . disability    Social History Main Topics  . Smoking status: Never Smoker   . Smokeless tobacco: Never Used  . Alcohol Use: No  . Drug Use: No  . Sexually Active: Not on file   Other Topics Concern  . Not on file   Social History Narrative  . No narrative on file     PHYSICAL EXAM   BP 117/70  Pulse 69  Resp 18  Ht 5\' 7"  (1.702 m)  Wt 267 lb (121.11 kg)  BMI 41.82 kg/m2  Constitutional: She is oriented to person, place, and time. She  appears well-developed and well-nourished. No distress.  HENT: No nasal discharge.  Head: Normocephalic and atraumatic.  Eyes: Pupils are equal, round, and reactive to light. Right eye exhibits no discharge. Left eye exhibits no discharge.  Neck: Normal range of motion. Neck supple. No JVD present. No thyromegaly present.  Cardiovascular: Normal rate, regular rhythm, normal heart sounds and intact distal pulses. Exam reveals no gallop and no friction rub.  No murmur heard.  Pulmonary/Chest: Effort normal and breath sounds normal. No stridor. No respiratory distress. She has no wheezes. She has no rales. She exhibits no tenderness.  Abdominal: Soft. Bowel sounds are normal. She exhibits no distension. There is no tenderness. There is no rebound and no guarding.  Musculoskeletal: Normal range of motion. She exhibits no edema and no tenderness.  Neurological: She is alert and oriented to person, place, and time. Coordination normal.  Skin: Skin is warm and dry. No rash noted. She is not diaphoretic. No erythema. No pallor.  Psychiatric: She has a normal mood and affect. Her behavior is normal. Judgment and thought content normal.      ASSESSMENT AND PLAN

## 2011-03-06 ENCOUNTER — Telehealth: Payer: Self-pay | Admitting: Cardiovascular Disease

## 2011-03-06 NOTE — Telephone Encounter (Signed)
Dr. Sherryll Burger called requesting to speak with Dr. Kirke Corin in reference to [facility]. Dr. Sherryll Burger is wanting to find out if Mrs. Peifer has Pulmonary Hypertension. Please call his office at ext. [phone].

## 2011-03-06 NOTE — Telephone Encounter (Signed)
Dr. Sherryll Burger is concerned about dyspnea. PFTs showed decreased diffusion capacity. The patient is not known to have pulmonary hypertension but has not had an echo or right heart cath as far as I can tell. Dr Sherryll Burger will order an echo and CT chest without contrast to look for interstitial lung disease.

## 2011-04-11 ENCOUNTER — Ambulatory Visit (INDEPENDENT_AMBULATORY_CARE_PROVIDER_SITE_OTHER): Payer: Medicare PPO | Admitting: Pulmonary Disease

## 2011-04-11 ENCOUNTER — Other Ambulatory Visit (INDEPENDENT_AMBULATORY_CARE_PROVIDER_SITE_OTHER): Payer: Medicare PPO

## 2011-04-11 ENCOUNTER — Encounter: Payer: Self-pay | Admitting: Pulmonary Disease

## 2011-04-11 VITALS — BP 130/78 | HR 64 | Temp 98.1°F | Ht 67.0 in | Wt 260.4 lb

## 2011-04-11 DIAGNOSIS — Z23 Encounter for immunization: Secondary | ICD-10-CM

## 2011-04-11 DIAGNOSIS — R0600 Dyspnea, unspecified: Secondary | ICD-10-CM

## 2011-04-11 DIAGNOSIS — R0989 Other specified symptoms and signs involving the circulatory and respiratory systems: Secondary | ICD-10-CM

## 2011-04-11 DIAGNOSIS — R0609 Other forms of dyspnea: Secondary | ICD-10-CM

## 2011-04-11 LAB — CBC
HCT: 29.2 % — ABNORMAL LOW (ref 36.0–46.0)
MCV: 79.1 fl (ref 78.0–100.0)
RBC: 3.7 Mil/uL — ABNORMAL LOW (ref 3.87–5.11)
RDW: 19.4 % — ABNORMAL HIGH (ref 11.5–14.6)

## 2011-04-11 NOTE — Patient Instructions (Signed)
Lab test today>>will call with results Follow up in 2 months with chest xray

## 2011-04-11 NOTE — Progress Notes (Deleted)
  Subjective:    Patient ID: Jenna Diaz, female    DOB: 10-May-1945, 65 y.o.   MRN: [id number]  HPI    Review of Systems  Constitutional: Negative.   HENT: Positive for congestion and sore throat.   Eyes: Negative.   Respiratory: Positive for cough and shortness of breath.   Cardiovascular: Positive for chest pain.  Gastrointestinal: Positive for abdominal pain.  Genitourinary: Negative.   Musculoskeletal: Positive for back pain, joint swelling and arthralgias.  Skin: Negative.   Neurological: Positive for headaches.  Hematological: Negative.   Psychiatric/Behavioral: Negative.        Objective:   Physical Exam        Assessment & Plan:

## 2011-04-11 NOTE — Assessment & Plan Note (Addendum)
Likely multifactorial.  She has history of anemia secondary to stomach ulcer.  Will repeat her CBC to determine if she remains anemic.  She has chest imaging findings as detailed above likely related to infection.  She was treated with antibiotics, and has clinical improvement.  She likely developed reactive airway disease after her respiratory infection.  She can continue advair and nebulizer therapy as needed for now.  Will re-assess at her next visit whether she needs further assessment for asthma, and continuing on maintenance therapy for asthma.  However, her recent PFT was not suggestive of obstructive lung disease.  She did have decrease in diffusion capacity on PFT, but this could be related to her anemia and obesity.  She will also need repeat chest xray at next visit, and then determine if/when she needs repeat CT chest.  She has history of coronary artery disease, and this is being monitored by cardiology.  She is obesity, and likely has a component of deconditioning.  There is nothing in her history of exam to suggest thrombo-embolic disease.  If her symptoms persist, she may also need cardio-pulmonary stress testing.

## 2011-04-11 NOTE — Progress Notes (Signed)
Chief Complaint  Patient presents with  . Pulmonary consult    Pt c/o increased sob with exertion x 4 months. Occasional dry cough and wheezing.    History of Present Illness: Jenna Diaz is a 65 y.o. female for evaluation of dyspnea.  She noticed a problem with her breathing start about 4 months ago.  Since then she was found to be anemic due to a stomach ulcer.  She also had evaluation with cardiology including a stress test and told she has coronary artery disease.  She did not have any trouble with her breathing prior to this.  She was treated for a bronchitis in November with cipro and was given a nebulizer also.  She uses her nebulizer twice per day, and feels this helps moisturize her throat.  She is not sure it helps with her breathing.  She has occasional cough, but usually no sputum.  She was wheezing when she had her bronchitis, but this has since resolved.  She has an advair inhaler that she uses as needed if she is traveling, and can't take her nebulizer.  She denies sinus congestion, gland swelling, joint swelling, or skin rash.  She as not had recent fever.  She denies any prior history of asthma or allergies.  She never smoked cigarettes.  She is not aware of having pneumonia or tuberculosis before.  There is no history of thrombo-embolic disease.  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  Past Medical History  Diagnosis Date  . Diabetes mellitus   . Essential hypertension   . Hypothyroidism   . Anemia   . GERD (gastroesophageal reflux disease)   . Hyperlipemia   . Potassium deficiency   . Bowel obstruction 1997  . Osteoarthritis   . Chronic kidney disease     questionable  . Gastric ulcer   . CAD (coronary artery disease)   . Blood transfusion     Past Surgical History  Procedure Date  . Colon resection   . Abdominal hysterectomy   . Hand surgery     cyst on left hand  . Nissen  fundoplication     questionable - pt unsure of surgical name  . Gastrostomy w/ feeding tube   . Cardiac catheterization 11/2010    moderate 2 vessel CAD. LAD 60%, RCA 50%. Diffuse obstructive disease in distal LCX.     Current Outpatient Prescriptions on File Prior to Visit  Medication Sig Dispense Refill  . amLODipine (NORVASC) 5 MG tablet Take 1 tablet (5 mg total) by mouth daily.  30 tablet  6  . aspirin EC 81 MG tablet Take 81 mg by mouth daily.        . fluticasone-salmeterol (ADVAIR HFA) 115-21 MCG/ACT inhaler Inhale 2 puffs into the lungs 2 (two) times daily.        . furosemide (LASIX) 40 MG tablet Take by mouth. Take 1/2 tablet by mouth once daily as needed       . gabapentin (NEURONTIN) 100 MG capsule Take 100 mg by mouth 3 (three) times daily.        . insulin aspart (NOVOLOG FLEXPEN) 100 UNIT/ML injection Inject 12 Units into the skin 2 (two) times daily.        . insulin glargine (LANTUS) 100 UNIT/ML injection Inject into the skin at bedtime. Take 45 units every morning and 40 units every evening       . iron polysaccharides (  NU-IRON) 150 MG capsule Take 150 mg by mouth daily.        Marland Kitchen levothyroxine (SYNTHROID, LEVOTHROID) 150 MCG tablet Take 150 mcg by mouth daily.        Marland Kitchen lisinopril (PRINIVIL,ZESTRIL) 20 MG tablet Take 20 mg by mouth daily.        . nitroGLYCERIN (NITROSTAT) 0.4 MG SL tablet Place 1 tablet (0.4 mg total) under the tongue every 5 (five) minutes as needed for chest pain.  25 tablet  3  . pantoprazole (PROTONIX) 40 MG tablet Take 40 mg by mouth 2 (two) times daily.       . potassium chloride (KLOR-CON) 10 MEQ CR tablet Take 20 mEq by mouth daily.       . pravastatin (PRAVACHOL) 40 MG tablet Take 1 tablet (40 mg total) by mouth every evening.  30 tablet  6  . traMADol (ULTRAM) 50 MG tablet Take 50 mg by mouth 3 (three) times daily.          No Known Allergies  family history includes Diabetes in her sister; Esophageal cancer in her father; Heart disease in her  brother, maternal grandmother, mother, and paternal grandmother; and Ovarian cancer in her sister.   reports that she has never smoked. She has never used smokeless tobacco. She reports that she does not drink alcohol or use illicit drugs.  Review of Systems  Constitutional: Negative.   HENT: Positive for congestion and sore throat.   Eyes: Negative.   Respiratory: Positive for cough and shortness of breath.   Cardiovascular: Positive for chest pain.  Gastrointestinal: Positive for abdominal pain.  Genitourinary: Negative.   Musculoskeletal: Positive for back pain, and arthralgias.  Skin: Negative.   Neurological: Positive for headaches.  Hematological: Negative.   Psychiatric/Behavioral: Negative.     Blood pressure 130/78, pulse 64, temperature 98.1 F (36.7 C), temperature source Oral, height 5\' 7"  (1.702 m), weight 260 lb 6.4 oz (118.117 kg), SpO2 100.00%. Body mass index is 40.78 kg/(m^2).  Physical Exam:  General - Obese HEENT - PERRLA, EOMI, no sinus tenderness, no oral exudate, no LAN, no thyromegaly Cardiac - s1s2 regular, no murmur Chest - no wheeze/rales/dullness Abdomen - soft, colostomy RLQ, no organomegaly Extremities - 1+ edema, no cyanosis/clubbing Neurologic - normal strength, CN intact Skin - no rashes Psychiatric - normal mood, behavior  Lab Results  Component Value Date   WBC 7.4 12/22/2010   HGB 8.0* 12/22/2010   HCT 25.1* 12/22/2010   MCV 92.3 12/22/2010   PLT 220 12/22/2010   Lab Results  Component Value Date   CREATININE 1.46* 12/21/2010   BUN 22 12/21/2010   NA 136 12/21/2010   K 4.2 12/21/2010   CL 103 12/21/2010   CO2 27 12/21/2010   CXR 12/15/10>>borderline cardiomegaly.  CT chest 03/16/11>>RML GGO, LLL GGO, ascending thoracic aorta 4.4 cm, mild CM  PFT 01/26/11>>FEV1 2.35(101%), FEV1% 86, TLC 2.41(111%), DLCO 33%, no BD   Assessment/Plan:  No problem-specific assessment & plan notes found for this encounter.   Outpatient Encounter  Prescriptions as of 04/11/2011  Medication Sig Dispense Refill  . amLODipine (NORVASC) 5 MG tablet Take 1 tablet (5 mg total) by mouth daily.  30 tablet  6  . aspirin EC 81 MG tablet Take 81 mg by mouth daily.        . fluticasone-salmeterol (ADVAIR HFA) 115-21 MCG/ACT inhaler Inhale 2 puffs into the lungs 2 (two) times daily.        . furosemide (LASIX) 40 MG  tablet Take by mouth. Take 1/2 tablet by mouth once daily as needed       . gabapentin (NEURONTIN) 100 MG capsule Take 100 mg by mouth 3 (three) times daily.        . insulin aspart (NOVOLOG FLEXPEN) 100 UNIT/ML injection Inject 12 Units into the skin 2 (two) times daily.        . insulin glargine (LANTUS) 100 UNIT/ML injection Inject into the skin at bedtime. Take 45 units every morning and 40 units every evening       . iron polysaccharides (NU-IRON) 150 MG capsule Take 150 mg by mouth daily.        Marland Kitchen levothyroxine (SYNTHROID, LEVOTHROID) 150 MCG tablet Take 150 mcg by mouth daily.        Marland Kitchen lisinopril (PRINIVIL,ZESTRIL) 20 MG tablet Take 20 mg by mouth daily.        . nitroGLYCERIN (NITROSTAT) 0.4 MG SL tablet Place 1 tablet (0.4 mg total) under the tongue every 5 (five) minutes as needed for chest pain.  25 tablet  3  . pantoprazole (PROTONIX) 40 MG tablet Take 40 mg by mouth 2 (two) times daily.       . potassium chloride (KLOR-CON) 10 MEQ CR tablet Take 20 mEq by mouth daily.       . pravastatin (PRAVACHOL) 40 MG tablet Take 1 tablet (40 mg total) by mouth every evening.  30 tablet  6  . traMADol (ULTRAM) 50 MG tablet Take 50 mg by mouth 3 (three) times daily.          Annebelle Bostic Pager:  [phone] 04/11/2011, 3:10 PM

## 2011-04-12 ENCOUNTER — Telehealth: Payer: Self-pay | Admitting: Pulmonary Disease

## 2011-04-13 ENCOUNTER — Encounter: Payer: Self-pay | Admitting: Pulmonary Disease

## 2011-04-17 ENCOUNTER — Ambulatory Visit (AMBULATORY_SURGERY_CENTER): Payer: Medicare PPO | Admitting: *Deleted

## 2011-04-17 VITALS — Ht 67.0 in | Wt 263.9 lb

## 2011-04-17 DIAGNOSIS — K253 Acute gastric ulcer without hemorrhage or perforation: Secondary | ICD-10-CM

## 2011-04-19 ENCOUNTER — Telehealth: Payer: Self-pay | Admitting: Pulmonary Disease

## 2011-04-19 NOTE — Telephone Encounter (Signed)
Will have my nurse inform patient that anemia has improved.  Will discuss further at next visit, but no change to current treatment plan.  Lab Results  Component Value Date   WBC 9.2 04/11/2011   HGB 9.6* 04/11/2011   HCT 29.2* 04/11/2011   MCV 79.1 04/11/2011   PLT 234.0 04/11/2011

## 2011-04-19 NOTE — Telephone Encounter (Signed)
ATC NA WCB unable to leave VM 

## 2011-04-20 ENCOUNTER — Encounter: Payer: Self-pay | Admitting: Internal Medicine

## 2011-04-20 ENCOUNTER — Ambulatory Visit (AMBULATORY_SURGERY_CENTER): Payer: Medicare PPO | Admitting: Internal Medicine

## 2011-04-20 VITALS — BP 122/66 | HR 53 | Temp 97.8°F | Resp 16 | Ht 67.0 in | Wt 263.0 lb

## 2011-04-20 DIAGNOSIS — K253 Acute gastric ulcer without hemorrhage or perforation: Secondary | ICD-10-CM

## 2011-04-20 DIAGNOSIS — K259 Gastric ulcer, unspecified as acute or chronic, without hemorrhage or perforation: Secondary | ICD-10-CM

## 2011-04-20 LAB — GLUCOSE, CAPILLARY
Glucose-Capillary: 81 mg/dL (ref 70–99)
Glucose-Capillary: 149 mg/dL — ABNORMAL HIGH (ref 70–99)

## 2011-04-20 MED ORDER — SODIUM CHLORIDE 0.9 % IV SOLN: 500.0000 mL | INTRAVENOUS | Status: DC

## 2011-04-20 MED ORDER — DEXTROSE 5 % IV SOLN: INTRAVENOUS | Status: DC

## 2011-04-20 NOTE — Progress Notes (Signed)
Patient did not experience any of the following events: a burn prior to discharge; a fall within the facility; wrong site/side/patient/procedure/implant event; or a hospital transfer or hospital admission upon discharge from the facility. (G8907) Patient did not have preoperative order for IV antibiotic SSI prophylaxis. (G8918)  

## 2011-04-20 NOTE — Op Note (Signed)
[facility] [address] [place], [place]  [address]  ENDOSCOPY PROCEDURE REPORT  PATIENT:  Jenna Diaz, Jenna Diaz  MR#:  [id number] BIRTHDATE:  10-Jan-1946, 65 yrs. old  GENDER:  female  ENDOSCOPIST:  Wilhemina Bonito. Eda Keys, MD Referred by:  .Direct  PROCEDURE DATE:  04/20/2011 PROCEDURE:  EGD with biopsy, 43239 ASA CLASS:  Class II INDICATIONS:  follow-up of gastric ulcer - last exam 02-21-11  MEDICATIONS:   MAC sedation, administered by CRNA, propofol (Diprivan) 120 mg IV TOPICAL ANESTHETIC:  none  DESCRIPTION OF PROCEDURE:   After the risks benefits and alternatives of the procedure were thoroughly explained, informed consent was obtained.  The LB [id number] endoscope was introduced through the mouth and advanced to the second portion of the duodenum, without limitations.  The instrument was slowly withdrawn as the mucosa was fully examined. <<PROCEDUREIMAGES>>  An persisting but improved 4mm x 8mm ulcer was found in the antrum. Bx taken.  Otherwise the examination was normal. Retroflexed views revealed no abnormalities.    The scope was then withdrawn from the patient and the procedure completed.  COMPLICATIONS:  None  ENDOSCOPIC IMPRESSION: 1) Ulcer in the antrum, improving 2) Otherwise normal examination 3) Not clear if left sided pain is related to ulcer  RECOMMENDATIONS: 1) Continue to Avoid NSAIDS 2) Continue Pantoprazole for ulcer healing 3) Repeat endoscopy in 8 weeks  ______________________________ Wilhemina Bonito. Eda Keys, MD  CC:  Kirstie Peri, MD; The Patient  n. eSIGNED:   Wilhemina Bonito. Eda Keys at 04/20/2011 02:24 PM  [name], [phone]

## 2011-04-20 NOTE — Patient Instructions (Signed)
Discharge instructions given with verbal understanding. Repeat endoscopy in 8 weeks call office to schedule and previsit too. Resume previous medications. Continue  To avoid NSAIDS.

## 2011-04-20 NOTE — Progress Notes (Signed)
Pt's accucheck 81, D5 hung when IV started

## 2011-04-21 ENCOUNTER — Encounter: Payer: Medicare PPO | Admitting: Internal Medicine

## 2011-04-21 ENCOUNTER — Telehealth: Payer: Self-pay | Admitting: *Deleted

## 2011-04-21 NOTE — Telephone Encounter (Signed)

## 2011-04-24 NOTE — Telephone Encounter (Signed)
I spoke with patient about results and she verbalized understanding and had no questions 

## 2011-04-27 ENCOUNTER — Encounter: Payer: Self-pay | Admitting: Cardiovascular Disease

## 2011-04-27 ENCOUNTER — Ambulatory Visit (INDEPENDENT_AMBULATORY_CARE_PROVIDER_SITE_OTHER): Payer: Medicare PPO | Admitting: Cardiovascular Disease

## 2011-04-27 DIAGNOSIS — D649 Anemia, unspecified: Secondary | ICD-10-CM

## 2011-04-27 DIAGNOSIS — I10 Essential (primary) hypertension: Secondary | ICD-10-CM

## 2011-04-27 DIAGNOSIS — R079 Chest pain, unspecified: Secondary | ICD-10-CM | POA: Insufficient documentation

## 2011-04-27 NOTE — Assessment & Plan Note (Signed)
This seems to be stable. Recent EGD showed that the gastric ulcer was improving.

## 2011-04-27 NOTE — Assessment & Plan Note (Addendum)
The patient is currently having chest pain at rest associated with significant dyspnea. Her ECG showed sinus bradycardia with a heart rate of 50 beats per minute. No significant ST or T wave changes. I recommend hospital admission to rule out myocardial infarction. She will be admitted to [facility] telemetry with serial cardiac enzymes. The patient has known history of coronary artery disease based on cardiac catheterization in August of this year. She had borderline significant stenosis in the left anterior descending artery. She will most likely require repeat cardiac catheterization with pressure wire interrogation of the LAD as well as RCA. Given that associated dyspnea, she would also need a right heart catheterization to evaluate for possible pulmonary hypertension and to look for other etiology for her significant dyspnea. I discussed the case with Dr. Sherryll Burger her primary care physician.

## 2011-04-27 NOTE — Progress Notes (Signed)
Addended by: Arlyss Gandy on: 04/27/2011 03:28 PM   Modules accepted: Orders

## 2011-04-27 NOTE — Progress Notes (Signed)
HPI  This is a 65 year old female who is here today for a followup visit. She has a known history of coronary artery disease. Cardiac catheterization in August of this year showed a 60% LAD stenosis and 50% RCA stenosis. The patient had anemia at that time that was due to gastric ulcer which was not malignant. She had a recent endoscopy which showed that the ulcer was healing but did not resolve. Her anemia improved. She is basically feeling much worse than her last followup. She is now having dyspnea with minimal activities associated with chest discomfort and tightness. This is now to the point where she is really not able to perform her activities of daily living. She was seen by pulmonary but has not finished the workup yet. While I was talking with the patient in the exam room today, she started having dyspnea and chest discomfort. This is associated with dizziness.  No Known Allergies   Current Outpatient Prescriptions on File Prior to Visit  Medication Sig Dispense Refill  . amLODipine (NORVASC) 5 MG tablet Take 1 tablet (5 mg total) by mouth daily.  30 tablet  6  . aspirin EC 81 MG tablet Take 81 mg by mouth daily.        . fluticasone-salmeterol (ADVAIR HFA) 115-21 MCG/ACT inhaler Inhale 2 puffs into the lungs 2 (two) times daily.        . furosemide (LASIX) 40 MG tablet Take by mouth. Take 1/2 tablet by mouth once daily as needed       . gabapentin (NEURONTIN) 100 MG capsule Take 100 mg by mouth 3 (three) times daily.        . insulin aspart (NOVOLOG FLEXPEN) 100 UNIT/ML injection Inject 12 Units into the skin 2 (two) times daily.        . insulin glargine (LANTUS) 100 UNIT/ML injection Inject into the skin at bedtime. Take 45 units every morning and 40 units every evening       . iron polysaccharides (NU-IRON) 150 MG capsule Take 150 mg by mouth daily.        Marland Kitchen levothyroxine (SYNTHROID, LEVOTHROID) 150 MCG tablet Take 150 mcg by mouth daily.        Marland Kitchen lisinopril (PRINIVIL,ZESTRIL) 20 MG  tablet Take 20 mg by mouth daily.        . nitroGLYCERIN (NITROSTAT) 0.4 MG SL tablet Place 1 tablet (0.4 mg total) under the tongue every 5 (five) minutes as needed for chest pain.  25 tablet  3  . pantoprazole (PROTONIX) 40 MG tablet Take 40 mg by mouth 2 (two) times daily.       . potassium chloride (KLOR-CON) 10 MEQ CR tablet Take 20 mEq by mouth daily.       . pravastatin (PRAVACHOL) 40 MG tablet Take 1 tablet (40 mg total) by mouth every evening.  30 tablet  6  . traMADol (ULTRAM) 50 MG tablet Take 50 mg by mouth 3 (three) times daily.         Current Facility-Administered Medications on File Prior to Visit  Medication Dose Route Frequency Provider Last Rate Last Dose  . DISCONTD: 0.9 %  sodium chloride infusion  500 mL Intravenous Continuous Yancey Flemings, MD      . DISCONTD: dextrose 5 % solution   Intravenous Continuous Yancey Flemings, MD         Past Medical History  Diagnosis Date  . Diabetes mellitus   . Essential hypertension   . Hypothyroidism   . Anemia   .  GERD (gastroesophageal reflux disease)   . Hyperlipemia   . Potassium deficiency   . Bowel obstruction 1997  . Osteoarthritis   . Chronic kidney disease     questionable  . Gastric ulcer   . CAD (coronary artery disease)   . Blood transfusion      Past Surgical History  Procedure Date  . Colon resection   . Abdominal hysterectomy   . Hand surgery     cyst on left hand  . Nissen fundoplication     questionable - pt unsure of surgical name  . Gastrostomy w/ feeding tube   . Cardiac catheterization 11/2010    moderate 2 vessel CAD. LAD 60%, RCA 50%. Diffuse obstructive disease in distal LCX.      Family History  Problem Relation Age of Onset  . Heart disease Mother     MI  . Heart disease Maternal Grandmother   . Heart disease Paternal Grandmother   . Ovarian cancer Sister   . Diabetes Sister     x3  . Heart disease Brother   . Esophageal cancer Father   . Colon cancer Neg Hx   . Stomach cancer Neg  Hx      History   Social History  . Marital Status: Single    Spouse Name: N/A    Number of Children: 3  . Years of Education: N/A   Occupational History  . disability    Social History Main Topics  . Smoking status: Never Smoker   . Smokeless tobacco: Never Used  . Alcohol Use: No  . Drug Use: No  . Sexually Active: Not on file   Other Topics Concern  . Not on file   Social History Narrative  . No narrative on file     ROS Constitutional: Negative for fever, chills, diaphoresis. HENT: Negative for hearing loss, nosebleeds, congestion, sore throat, facial swelling, drooling, trouble swallowing, neck pain, voice change, sinus pressure and tinnitus.  Eyes: Negative for photophobia, pain, discharge and visual disturbance.  Respiratory: Negative for apnea, cough. Cardiovascular: Negative for  leg swelling.  Gastrointestinal: Negative for nausea, vomiting, abdominal pain, diarrhea, constipation, blood in stool and abdominal distention.  Genitourinary: Negative for dysuria, urgency, frequency, hematuria and decreased urine volume.  Musculoskeletal: Negative for myalgias, back pain, joint swelling, arthralgias and gait problem.  Skin: Negative for color change, pallor, rash and wound.  Neurological: Negative for dizziness, tremors, seizures, syncope, speech difficulty, light-headedness, numbness and headaches.  Psychiatric/Behavioral: Negative for suicidal ideas, hallucinations, behavioral problems and agitation. The patient is not nervous/anxious.     PHYSICAL EXAM   BP 144/79  Pulse 51  Ht 5\' 7"  (1.702 m)  Wt 267 lb 12.8 oz (121.473 kg)  BMI 41.94 kg/m2  SpO2 99%  Constitutional: She is oriented to person, place, and time. She appears well-developed and well-nourished. mild distress.  HENT: No nasal discharge.  Head: Normocephalic and atraumatic.  Eyes: Pupils are equal, round, and reactive to light. Right eye exhibits no discharge. Left eye exhibits no discharge.   Neck: Normal range of motion. Neck supple. No JVD present. No thyromegaly present.  Cardiovascular: Normal rate, regular rhythm, normal heart sounds and intact distal pulses. Exam reveals no gallop and no friction rub.  No murmur heard.  Pulmonary/Chest: Effort normal and breath sounds normal. No stridor. No respiratory distress. She has no wheezes. She has no rales. She exhibits no tenderness.  Abdominal: Soft. Bowel sounds are normal. She exhibits no distension. There is no tenderness.  There is no rebound and no guarding.  Musculoskeletal: Normal range of motion. She exhibits no edema and no tenderness.  Neurological: She is alert and oriented to person, place, and time. Coordination normal.  Skin: Skin is warm and dry. No rash noted. She is not diaphoretic. No erythema. No pallor.  Psychiatric: She has a normal mood and affect. Her behavior is normal. Judgment and thought content normal.     EKG:   ASSESSMENT AND PLAN

## 2011-04-27 NOTE — Assessment & Plan Note (Signed)
Her blood pressure is mildly elevated. It will be monitored.

## 2011-04-28 ENCOUNTER — Encounter (HOSPITAL_COMMUNITY)
Admission: AD | Disposition: A | Discharge status: Home / Self Care | Payer: Self-pay | Source: Other Acute Inpatient Hospital | Attending: Cardiovascular Disease

## 2011-04-28 ENCOUNTER — Other Ambulatory Visit: Payer: Self-pay

## 2011-04-28 ENCOUNTER — Encounter (HOSPITAL_COMMUNITY): Payer: Self-pay | Admitting: Nurse Practitioner

## 2011-04-28 ENCOUNTER — Ambulatory Visit (HOSPITAL_COMMUNITY): Admit: 2011-04-28 | Payer: Self-pay | Admitting: Cardiology

## 2011-04-28 ENCOUNTER — Inpatient Hospital Stay (HOSPITAL_COMMUNITY)
Admission: AD | Admit: 2011-04-28 | Discharge: 2011-04-29 | DRG: 247 | Disposition: A | Discharge status: Home / Self Care | Payer: Medicare PPO | Source: Other Acute Inpatient Hospital | Attending: Cardiovascular Disease | Admitting: Cardiovascular Disease

## 2011-04-28 DIAGNOSIS — E039 Hypothyroidism, unspecified: Secondary | ICD-10-CM | POA: Insufficient documentation

## 2011-04-28 DIAGNOSIS — I10 Essential (primary) hypertension: Secondary | ICD-10-CM | POA: Insufficient documentation

## 2011-04-28 DIAGNOSIS — K219 Gastro-esophageal reflux disease without esophagitis: Secondary | ICD-10-CM | POA: Diagnosis present

## 2011-04-28 DIAGNOSIS — I200 Unstable angina: Secondary | ICD-10-CM

## 2011-04-28 DIAGNOSIS — E1159 Type 2 diabetes mellitus with other circulatory complications: Secondary | ICD-10-CM | POA: Insufficient documentation

## 2011-04-28 DIAGNOSIS — E785 Hyperlipidemia, unspecified: Secondary | ICD-10-CM | POA: Insufficient documentation

## 2011-04-28 DIAGNOSIS — Z794 Long term (current) use of insulin: Secondary | ICD-10-CM

## 2011-04-28 DIAGNOSIS — K279 Peptic ulcer, site unspecified, unspecified as acute or chronic, without hemorrhage or perforation: Secondary | ICD-10-CM | POA: Diagnosis present

## 2011-04-28 DIAGNOSIS — M1990 Unspecified osteoarthritis, unspecified site: Secondary | ICD-10-CM | POA: Diagnosis present

## 2011-04-28 DIAGNOSIS — I2510 Atherosclerotic heart disease of native coronary artery without angina pectoris: Secondary | ICD-10-CM

## 2011-04-28 DIAGNOSIS — Z7982 Long term (current) use of aspirin: Secondary | ICD-10-CM

## 2011-04-28 DIAGNOSIS — Z6841 Body Mass Index (BMI) 40.0 and over, adult: Secondary | ICD-10-CM

## 2011-04-28 DIAGNOSIS — D649 Anemia, unspecified: Secondary | ICD-10-CM | POA: Insufficient documentation

## 2011-04-28 DIAGNOSIS — E119 Type 2 diabetes mellitus without complications: Secondary | ICD-10-CM | POA: Diagnosis present

## 2011-04-28 DIAGNOSIS — Z79899 Other long term (current) drug therapy: Secondary | ICD-10-CM

## 2011-04-28 HISTORY — PX: LEFT AND RIGHT HEART CATHETERIZATION WITH CORONARY ANGIOGRAM: SHX5449

## 2011-04-28 LAB — POCT I-STAT 3, ART BLOOD GAS (G3+)
Acid-Base Excess: 2 mmol/L (ref 0.0–2.0)
Bicarbonate: 27.3 mEq/L — ABNORMAL HIGH (ref 20.0–24.0)
O2 Saturation: 96 %
TCO2: 29 mmol/L (ref 0–100)
pO2, Arterial: 88 mmHg (ref 80.0–100.0)

## 2011-04-28 LAB — GLUCOSE, CAPILLARY: Glucose-Capillary: 136 mg/dL — ABNORMAL HIGH (ref 70–99)

## 2011-04-28 SURGERY — LEFT AND RIGHT HEART CATHETERIZATION WITH CORONARY ANGIOGRAM

## 2011-04-28 MED ORDER — INSULIN ASPART 100 UNIT/ML ~~LOC~~ SOLN
0.0000 [IU] | Freq: Three times a day (TID) | SUBCUTANEOUS | Status: DC
  Administered 2011-04-29: 2 [IU] via SUBCUTANEOUS
  Filled 2011-04-28: qty 3

## 2011-04-28 MED ORDER — ADENOSINE (DIAGNOSTIC) 3 MG/ML IV SOLN
20.0000 mL | Freq: Once | INTRAVENOUS | Status: DC
  Filled 2011-04-28: qty 20

## 2011-04-28 MED ORDER — AMLODIPINE BESYLATE 5 MG PO TABS
5.0000 mg | ORAL_TABLET | Freq: Every day | ORAL | Status: DC
  Administered 2011-04-28 – 2011-04-29 (×2): 5 mg via ORAL
  Filled 2011-04-28 (×2): qty 1

## 2011-04-28 MED ORDER — SODIUM CHLORIDE 0.9 % IJ SOLN: 3.0000 mL | INTRAMUSCULAR | Status: DC | PRN

## 2011-04-28 MED ORDER — ONDANSETRON HCL 4 MG/2ML IJ SOLN: 4.0000 mg | Freq: Four times a day (QID) | INTRAMUSCULAR | Status: DC | PRN

## 2011-04-28 MED ORDER — NITROGLYCERIN 0.4 MG SL SUBL: 0.4000 mg | SUBLINGUAL_TABLET | SUBLINGUAL | Status: DC | PRN

## 2011-04-28 MED ORDER — ACETAMINOPHEN 325 MG PO TABS: 650.0000 mg | ORAL_TABLET | ORAL | Status: DC | PRN

## 2011-04-28 MED ORDER — SODIUM CHLORIDE 0.9 % IV SOLN: 250.0000 mL | INTRAVENOUS | Status: DC | PRN

## 2011-04-28 MED ORDER — HEPARIN SODIUM (PORCINE) 1000 UNIT/ML IJ SOLN
INTRAMUSCULAR | Status: AC
  Filled 2011-04-28: qty 1

## 2011-04-28 MED ORDER — TICAGRELOR 90 MG PO TABS
ORAL_TABLET | ORAL | Status: AC
  Administered 2011-04-28: 90 mg via ORAL
  Filled 2011-04-28: qty 2

## 2011-04-28 MED ORDER — SODIUM CHLORIDE 0.9 % IJ SOLN: 3.0000 mL | Freq: Two times a day (BID) | INTRAMUSCULAR | Status: DC

## 2011-04-28 MED ORDER — HEPARIN (PORCINE) IN NACL 2-0.9 UNIT/ML-% IJ SOLN
INTRAMUSCULAR | Status: AC
  Filled 2011-04-28: qty 2000

## 2011-04-28 MED ORDER — LEVOTHYROXINE SODIUM 150 MCG PO TABS
150.0000 ug | ORAL_TABLET | Freq: Every day | ORAL | Status: DC
  Administered 2011-04-29: 150 ug via ORAL
  Filled 2011-04-28 (×2): qty 1

## 2011-04-28 MED ORDER — ALPRAZOLAM 0.25 MG PO TABS: 0.2500 mg | ORAL_TABLET | Freq: Two times a day (BID) | ORAL | Status: DC | PRN

## 2011-04-28 MED ORDER — PANTOPRAZOLE SODIUM 40 MG PO TBEC
40.0000 mg | DELAYED_RELEASE_TABLET | Freq: Two times a day (BID) | ORAL | Status: DC
  Administered 2011-04-28 – 2011-04-29 (×3): 40 mg via ORAL
  Filled 2011-04-28 (×3): qty 1

## 2011-04-28 MED ORDER — NITROGLYCERIN 0.2 MG/ML ON CALL CATH LAB
INTRAVENOUS | Status: AC
  Filled 2011-04-28: qty 1

## 2011-04-28 MED ORDER — GABAPENTIN 100 MG PO CAPS
100.0000 mg | ORAL_CAPSULE | Freq: Three times a day (TID) | ORAL | Status: DC
  Administered 2011-04-28 – 2011-04-29 (×3): 100 mg via ORAL
  Filled 2011-04-28 (×6): qty 1

## 2011-04-28 MED ORDER — DIAZEPAM 5 MG PO TABS: 5.0000 mg | ORAL_TABLET | ORAL | Status: DC | PRN

## 2011-04-28 MED ORDER — SODIUM CHLORIDE 0.9 % IV SOLN
INTRAVENOUS | Status: DC
  Administered 2011-04-28 (×2): via INTRAVENOUS

## 2011-04-28 MED ORDER — TRAMADOL HCL 50 MG PO TABS
50.0000 mg | ORAL_TABLET | Freq: Three times a day (TID) | ORAL | Status: DC
  Administered 2011-04-28 – 2011-04-29 (×3): 50 mg via ORAL
  Filled 2011-04-28 (×5): qty 1

## 2011-04-28 MED ORDER — FENTANYL CITRATE 0.05 MG/ML IJ SOLN
INTRAMUSCULAR | Status: AC
  Filled 2011-04-28: qty 2

## 2011-04-28 MED ORDER — LIDOCAINE HCL (PF) 1 % IJ SOLN
INTRAMUSCULAR | Status: AC
  Filled 2011-04-28: qty 30

## 2011-04-28 MED ORDER — MIDAZOLAM HCL 2 MG/2ML IJ SOLN
INTRAMUSCULAR | Status: AC
  Filled 2011-04-28: qty 2

## 2011-04-28 MED ORDER — FLUTICASONE-SALMETEROL 250-50 MCG/DOSE IN AEPB
1.0000 | INHALATION_SPRAY | Freq: Two times a day (BID) | RESPIRATORY_TRACT | Status: DC
  Administered 2011-04-28 – 2011-04-29 (×3): 1 via RESPIRATORY_TRACT
  Filled 2011-04-28 (×2): qty 14

## 2011-04-28 MED ORDER — POLYSACCHARIDE IRON 150 MG PO CAPS
150.0000 mg | ORAL_CAPSULE | Freq: Every day | ORAL | Status: DC
  Administered 2011-04-28 – 2011-04-29 (×2): 150 mg via ORAL
  Filled 2011-04-28 (×2): qty 1

## 2011-04-28 MED ORDER — MORPHINE SULFATE 2 MG/ML IJ SOLN
2.0000 mg | INTRAMUSCULAR | Status: DC | PRN
  Administered 2011-04-28: 2 mg via INTRAVENOUS
  Filled 2011-04-28: qty 1

## 2011-04-28 MED ORDER — INSULIN GLARGINE 100 UNIT/ML ~~LOC~~ SOLN
40.0000 [IU] | Freq: Every day | SUBCUTANEOUS | Status: DC
  Administered 2011-04-28: 40 [IU] via SUBCUTANEOUS
  Filled 2011-04-28 (×2): qty 3

## 2011-04-28 MED ORDER — ASPIRIN EC 81 MG PO TBEC
81.0000 mg | DELAYED_RELEASE_TABLET | Freq: Every day | ORAL | Status: DC
  Administered 2011-04-28 – 2011-04-29 (×2): 81 mg via ORAL
  Filled 2011-04-28 (×2): qty 1

## 2011-04-28 MED ORDER — LISINOPRIL 20 MG PO TABS
20.0000 mg | ORAL_TABLET | Freq: Every day | ORAL | Status: DC
  Administered 2011-04-28 – 2011-04-29 (×2): 20 mg via ORAL
  Filled 2011-04-28 (×2): qty 1

## 2011-04-28 MED ORDER — SIMVASTATIN 40 MG PO TABS
40.0000 mg | ORAL_TABLET | Freq: Every day | ORAL | Status: DC
  Administered 2011-04-28: 40 mg via ORAL
  Filled 2011-04-28 (×3): qty 1

## 2011-04-28 MED ORDER — TICAGRELOR 90 MG PO TABS
90.0000 mg | ORAL_TABLET | Freq: Two times a day (BID) | ORAL | Status: DC
  Administered 2011-04-28 – 2011-04-29 (×2): 90 mg via ORAL
  Filled 2011-04-28 (×3): qty 1

## 2011-04-28 MED ORDER — ZOLPIDEM TARTRATE 5 MG PO TABS: 10.0000 mg | ORAL_TABLET | Freq: Every evening | ORAL | Status: DC | PRN

## 2011-04-28 MED ORDER — FLUTICASONE-SALMETEROL 115-21 MCG/ACT IN AERO: 2.0000 | INHALATION_SPRAY | Freq: Two times a day (BID) | RESPIRATORY_TRACT | Status: DC

## 2011-04-28 MED ORDER — INSULIN ASPART 100 UNIT/ML ~~LOC~~ SOLN
12.0000 [IU] | Freq: Two times a day (BID) | SUBCUTANEOUS | Status: DC
  Administered 2011-04-29: 12 [IU] via SUBCUTANEOUS
  Filled 2011-04-28 (×2): qty 3

## 2011-04-28 MED ORDER — SODIUM CHLORIDE 0.9 % IV SOLN: 1.0000 mL/kg/h | INTRAVENOUS | Status: AC

## 2011-04-28 NOTE — Procedures (Signed)
Cardiac Catheterization Procedure Note  Name: Jenna Diaz MRN: [id number] DOB: 17-Mar-1946  Procedure: Left Heart Cath, Selective Coronary Angiography, FFR of the LAD, FFR of the RCA, and Stenting of the mid-LAD  Indication: Unstable angina, resting dyspnea.   Diagnostic Procedure Details: The right groin was prepped, draped, and anesthetized with 1% lidocaine. Using the modified Seldinger technique, a 5 French sheath was introduced into the right femoral artery. A 7 Fr sheath was introduced into the right femoral vein. Both vessels were accessed with a front-wall puncture. The right heart cath was done with a Swan-Ganz catheter following usual protocol. Standard Judkins catheters were used for selective coronary angiography. Left ventriculography was deferred because of renal insufficiency. Catheter exchanges were performed over a wire.  The diagnostic procedure was well-tolerated without immediate complications.  PROCEDURAL FINDINGS Hemodynamics: RA: 3 RV: 24/3 PA: 20/5 mean 11 PCWP: 6 AO: 116/63 LV: 120/3  O2 sats: PA 65, Ao 96  Fick CO 7 l/m, CI 3 l/m/square meter  Coronary angiography: Coronary dominance: right  Left mainstem: Patent with no obstructive disease  Left anterior descending (LAD): Reaches LV apex. 70% mid-stenosis, otherwise patent vessel. Diagonal branches moderate caliber and without significant disease.  Left circumflex (LCx): Patent throughout with 2 large OM's without significant disease. Distal LCx severely diseased into a tiny PL branch.  Right coronary artery (RCA): Eccentric 50% proximal stenosis present. Dominant vessel with no other areas of significant disease.  PCI Procedure Note:  Following the diagnostic procedure, the decision was made to proceed with pressure wire analysis of the LAD and RCA. The sheath was upsized to a 6 [place]. Weight-based heparin was given for anticoagulation. Once a therapeutic ACT was achieved, a 6 [place] XB-LAD guide  catheter was inserted.  A primewire was used to cross the lesion. IV adenosine was administered. The FFR was 0.79 and the wire pullback showed this gradient to be entirely across a focal portion of the mid-LAD.  The lesion was primarily stented with a 2.75x26mm Promus Element drug-eluting stent.  The stent was postdilated with a 3.0 noncompliant balloon.  Following PCI, there was 0% residual stenosis and TIMI-3 flow. Final angiography confirmed an excellent result. Attention was then turned to the RCA. A JR-4 guide was used. The primewire was advanced across the lesion after zeroing at the end of the guide catheter. Adenosine was again administered and the FFR was 0.93. Femoral hemostasis will be achieved with manual hemostasis as the patient has a high femoral bifurcation.  The patient tolerated the PCI procedure well. There were no immediate procedural complications.  The patient was transferred to the post catheterization recovery area for further monitoring.  Final Conclusions:   1. Severe mid-LAD stenosis with successful PCI under FFR guidance 2. Negative FFR of the RCA  3. Patent left circumflex  Recommendations: ASA/[place] greater than 12 months without interruption.  Tonny Bollman 04/28/2011, 5:03 PM

## 2011-04-28 NOTE — H&P (View-Only) (Signed)
HPI  This is a 65-year-old female who is here today for a followup visit. She has a known history of coronary artery disease. Cardiac catheterization in August of this year showed a 60% LAD stenosis and 50% RCA stenosis. The patient had anemia at that time that was due to gastric ulcer which was not malignant. She had a recent endoscopy which showed that the ulcer was healing but did not resolve. Her anemia improved. She is basically feeling much worse than her last followup. She is now having dyspnea with minimal activities associated with chest discomfort and tightness. This is now to the point where she is really not able to perform her activities of daily living. She was seen by pulmonary but has not finished the workup yet. While I was talking with the patient in the exam room today, she started having dyspnea and chest discomfort. This is associated with dizziness.  No Known Allergies   Current Outpatient Prescriptions on File Prior to Visit  Medication Sig Dispense Refill  . amLODipine (NORVASC) 5 MG tablet Take 1 tablet (5 mg total) by mouth daily.  30 tablet  6  . aspirin EC 81 MG tablet Take 81 mg by mouth daily.        . fluticasone-salmeterol (ADVAIR HFA) 115-21 MCG/ACT inhaler Inhale 2 puffs into the lungs 2 (two) times daily.        . furosemide (LASIX) 40 MG tablet Take by mouth. Take 1/2 tablet by mouth once daily as needed       . gabapentin (NEURONTIN) 100 MG capsule Take 100 mg by mouth 3 (three) times daily.        . insulin aspart (NOVOLOG FLEXPEN) 100 UNIT/ML injection Inject 12 Units into the skin 2 (two) times daily.        . insulin glargine (LANTUS) 100 UNIT/ML injection Inject into the skin at bedtime. Take 45 units every morning and 40 units every evening       . iron polysaccharides (NU-IRON) 150 MG capsule Take 150 mg by mouth daily.        . levothyroxine (SYNTHROID, LEVOTHROID) 150 MCG tablet Take 150 mcg by mouth daily.        . lisinopril (PRINIVIL,ZESTRIL) 20 MG  tablet Take 20 mg by mouth daily.        . nitroGLYCERIN (NITROSTAT) 0.4 MG SL tablet Place 1 tablet (0.4 mg total) under the tongue every 5 (five) minutes as needed for chest pain.  25 tablet  3  . pantoprazole (PROTONIX) 40 MG tablet Take 40 mg by mouth 2 (two) times daily.       . potassium chloride (KLOR-CON) 10 MEQ CR tablet Take 20 mEq by mouth daily.       . pravastatin (PRAVACHOL) 40 MG tablet Take 1 tablet (40 mg total) by mouth every evening.  30 tablet  6  . traMADol (ULTRAM) 50 MG tablet Take 50 mg by mouth 3 (three) times daily.         Current Facility-Administered Medications on File Prior to Visit  Medication Dose Route Frequency Provider Last Rate Last Dose  . DISCONTD: 0.9 %  sodium chloride infusion  500 mL Intravenous Continuous John Perry, MD      . DISCONTD: dextrose 5 % solution   Intravenous Continuous John Perry, MD         Past Medical History  Diagnosis Date  . Diabetes mellitus   . Essential hypertension   . Hypothyroidism   . Anemia   .   GERD (gastroesophageal reflux disease)   . Hyperlipemia   . Potassium deficiency   . Bowel obstruction 1997  . Osteoarthritis   . Chronic kidney disease     questionable  . Gastric ulcer   . CAD (coronary artery disease)   . Blood transfusion      Past Surgical History  Procedure Date  . Colon resection   . Abdominal hysterectomy   . Hand surgery     cyst on left hand  . Nissen fundoplication     questionable - pt unsure of surgical name  . Gastrostomy w/ feeding tube   . Cardiac catheterization 11/2010    moderate 2 vessel CAD. LAD 60%, RCA 50%. Diffuse obstructive disease in distal LCX.      Family History  Problem Relation Age of Onset  . Heart disease Mother     MI  . Heart disease Maternal Grandmother   . Heart disease Paternal Grandmother   . Ovarian cancer Sister   . Diabetes Sister     x3  . Heart disease Brother   . Esophageal cancer Father   . Colon cancer Neg Hx   . Stomach cancer Neg  Hx      History   Social History  . Marital Status: Single    Spouse Name: N/A    Number of Children: 3  . Years of Education: N/A   Occupational History  . disability    Social History Main Topics  . Smoking status: Never Smoker   . Smokeless tobacco: Never Used  . Alcohol Use: No  . Drug Use: No  . Sexually Active: Not on file   Other Topics Concern  . Not on file   Social History Narrative  . No narrative on file     ROS Constitutional: Negative for fever, chills, diaphoresis. HENT: Negative for hearing loss, nosebleeds, congestion, sore throat, facial swelling, drooling, trouble swallowing, neck pain, voice change, sinus pressure and tinnitus.  Eyes: Negative for photophobia, pain, discharge and visual disturbance.  Respiratory: Negative for apnea, cough. Cardiovascular: Negative for  leg swelling.  Gastrointestinal: Negative for nausea, vomiting, abdominal pain, diarrhea, constipation, blood in stool and abdominal distention.  Genitourinary: Negative for dysuria, urgency, frequency, hematuria and decreased urine volume.  Musculoskeletal: Negative for myalgias, back pain, joint swelling, arthralgias and gait problem.  Skin: Negative for color change, pallor, rash and wound.  Neurological: Negative for dizziness, tremors, seizures, syncope, speech difficulty, light-headedness, numbness and headaches.  Psychiatric/Behavioral: Negative for suicidal ideas, hallucinations, behavioral problems and agitation. The patient is not nervous/anxious.     PHYSICAL EXAM   BP 144/79  Pulse 51  Ht 5' 7" (1.702 m)  Wt 267 lb 12.8 oz (121.473 kg)  BMI 41.94 kg/m2  SpO2 99%  Constitutional: She is oriented to person, place, and time. She appears well-developed and well-nourished. mild distress.  HENT: No nasal discharge.  Head: Normocephalic and atraumatic.  Eyes: Pupils are equal, round, and reactive to light. Right eye exhibits no discharge. Left eye exhibits no discharge.   Neck: Normal range of motion. Neck supple. No JVD present. No thyromegaly present.  Cardiovascular: Normal rate, regular rhythm, normal heart sounds and intact distal pulses. Exam reveals no gallop and no friction rub.  No murmur heard.  Pulmonary/Chest: Effort normal and breath sounds normal. No stridor. No respiratory distress. She has no wheezes. She has no rales. She exhibits no tenderness.  Abdominal: Soft. Bowel sounds are normal. She exhibits no distension. There is no tenderness.   There is no rebound and no guarding.  Musculoskeletal: Normal range of motion. She exhibits no edema and no tenderness.  Neurological: She is alert and oriented to person, place, and time. Coordination normal.  Skin: Skin is warm and dry. No rash noted. She is not diaphoretic. No erythema. No pallor.  Psychiatric: She has a normal mood and affect. Her behavior is normal. Judgment and thought content normal.     EKG:   ASSESSMENT AND PLAN   

## 2011-04-28 NOTE — Interval H&P Note (Signed)
History and Physical Interval Note:  04/28/2011 4:07 PM  Jenna Diaz  has presented today for surgery, with the diagnosis of chest pain  The various methods of treatment have been discussed with the patient and family. After consideration of risks, benefits and other options for treatment, the patient has consented to  Procedure(s): LEFT AND RIGHT HEART CATHETERIZATION WITH CORONARY ANGIOGRAM as a surgical intervention .  The patients' history has been reviewed, patient examined, no change in status, stable for surgery.  I have reviewed the patients' chart and labs.  Questions were answered to the patient's satisfaction.     Tonny Bollman

## 2011-04-29 ENCOUNTER — Other Ambulatory Visit: Payer: Self-pay

## 2011-04-29 ENCOUNTER — Encounter (HOSPITAL_COMMUNITY): Payer: Self-pay | Admitting: *Deleted

## 2011-04-29 DIAGNOSIS — I200 Unstable angina: Secondary | ICD-10-CM

## 2011-04-29 LAB — GLUCOSE, CAPILLARY: Glucose-Capillary: 130 mg/dL — ABNORMAL HIGH (ref 70–99)

## 2011-04-29 LAB — CBC
HCT: 31 % — ABNORMAL LOW (ref 36.0–46.0)
Hemoglobin: 9.6 g/dL — ABNORMAL LOW (ref 12.0–15.0)
MCV: 81.8 fL (ref 78.0–100.0)
WBC: 8.3 10*3/uL (ref 4.0–10.5)

## 2011-04-29 LAB — BASIC METABOLIC PANEL
BUN: 14 mg/dL (ref 6–23)
Chloride: 105 mEq/L (ref 96–112)
Creatinine, Ser: 1.44 mg/dL — ABNORMAL HIGH (ref 0.50–1.10)
Glucose, Bld: 177 mg/dL — ABNORMAL HIGH (ref 70–99)
Potassium: 3.8 mEq/L (ref 3.5–5.1)

## 2011-04-29 MED ORDER — TICAGRELOR 90 MG PO TABS: 90.0000 mg | ORAL_TABLET | Freq: Two times a day (BID) | ORAL | Status: DC

## 2011-04-29 NOTE — Discharge Summary (Signed)
Discharge Summary   Patient ID: Jenna Diaz MRN: [id number], DOB/AGE: [birth year] 65 y.o. Admit date: 04/28/2011 D/C date:     04/29/2011    Primary Cardiologist:  Dr. Lorine Bears  Primary Electrophysiologist:  None   Reason for Admission:  Chest Pain  Primary Discharge Diagnoses:   1. Unstable angina - s/p PCI to LAD this admission  Secondary Discharge Diagnoses:  Past Medical History  Diagnosis Date  . Diabetes mellitus   . Essential hypertension   . Hypothyroidism   . Anemia   . GERD (gastroesophageal reflux disease)   . Hyperlipemia   . Potassium deficiency   . Bowel obstruction 1997  . Osteoarthritis   . Chronic kidney disease     questionable  . Gastric ulcer   . CAD (coronary artery disease)     A. 11/2010 - Cath: 60% LAD, 50% RCA   . Blood transfusion   . Angina   . Shortness of breath   . Recurrent upper respiratory infection (URI)   . Headache   . Colostomy care   . Pneumonia 11/2010  . Bronchitis 11/2010     Procedures Performed This Admission:  Cardiac catheterization 04/28/11: Left mainstem: Patent with no obstructive disease  Left anterior descending (LAD): Reaches LV apex. 70% mid-stenosis, otherwise patent vessel. Diagonal branches moderate caliber and without significant disease.  Left circumflex (LCx): Patent throughout with 2 large OM's without significant disease. Distal LCx severely diseased into a tiny PL branch.  Right coronary artery (RCA): Eccentric 50% proximal stenosis present. Dominant vessel with no other areas of significant disease.  PCI Procedure Note: LAD FFR was 0.79 and the lesion was primarily stented with a 2.75x50mm Promus Element drug-eluting stent.  RCA FFR was 0.93.   Final Conclusions:  1. Severe mid-LAD stenosis with successful PCI under FFR guidance  2. Negative FFR of the RCA  3. Patent left circumflex   Recommendations: ASA/Brinlinta greater than 12 months without interruption.   Hospital Course:  Jenna Diaz is a 65 y.o. female who presented for followup 12/27 with noticeable exertional dyspnea and chest discomfort. She was having symptoms at rest and was admitted to [facility]. She ruled out for myocardial infarction. She was transferred to [facility] for cardiac catheterization. As noted above, this demonstrated significant LAD stenosis which was treated with PCI. Her RCA stenosis was not felt to be hemodynamically significant. The patient would need to remain on dual antiplatelet therapy for 12 months. Of note, she had an upper GI bleed secondary to gastric ulcer in 8/12. Followup EGD 04/20/11 demonstrated improved gastric ulcer without complete resolution. Proton pump inhibitor therapy has been continued. She will need followup with Dr. Lorine Bears in 2 weeks. We will also need to check a CBC at that time to followup on her hemoglobin.   Discharge Vitals: Blood pressure 130/60, pulse 62, temperature 97.7 F (36.5 C), temperature source Oral, resp. rate 18, height 5\' 7"  (1.702 m), weight 259 lb 4.2 oz (117.6 kg), SpO2 100.00%.  Labs: Lab Results  Component Value Date   WBC 8.3 04/29/2011   HGB 9.6* 04/29/2011   HCT 31.0* 04/29/2011   MCV 81.8 04/29/2011   PLT 205 04/29/2011    Lab 04/29/11 0600  NA 141  K 3.8  CL 105  CO2 28  BUN 14  CREATININE 1.44*  CALCIUM 9.0  PROT --  BILITOT --  ALKPHOS --  ALT --  AST --  GLUCOSE 177*   No results found  for this basename: CKTOTAL:4,CKMB:4,TROPONINI:4 in the last 72 hours No results found for this basename: CHOL, HDL, LDLCALC, TRIG   No results found for this basename: DDIMER   Lab Results  Component Value Date   TSH 0.170* 12/17/2010    Diagnostic Studies/Procedures:  No results found.  Discharge Medications   Current Discharge Medication List    START taking these medications   Details  Ticagrelor (BRILINTA) 90 MG TABS tablet Take 1 tablet (90 mg total) by mouth 2 (two) times daily. Qty: 60 tablet,  Refills: 11      CONTINUE these medications which have NOT CHANGED   Details  amLODipine (NORVASC) 5 MG tablet Take 1 tablet (5 mg total) by mouth daily. Qty: 30 tablet, Refills: 6    aspirin EC 81 MG tablet Take 81 mg by mouth daily.      fluticasone-salmeterol (ADVAIR HFA) 115-21 MCG/ACT inhaler Inhale 2 puffs into the lungs 2 (two) times daily.      furosemide (LASIX) 40 MG tablet Take 20 mg by mouth daily as needed. For fluid retention     gabapentin (NEURONTIN) 100 MG capsule Take 100 mg by mouth 3 (three) times daily.      insulin aspart (NOVOLOG FLEXPEN) 100 UNIT/ML injection Inject 12 Units into the skin 2 (two) times daily.      insulin glargine (LANTUS) 100 UNIT/ML injection Inject into the skin at bedtime. Take 45 units every morning and 40 units every evening     iron polysaccharides (NU-IRON) 150 MG capsule Take 150 mg by mouth daily.      levothyroxine (SYNTHROID, LEVOTHROID) 150 MCG tablet Take 150 mcg by mouth daily.      lisinopril (PRINIVIL,ZESTRIL) 20 MG tablet Take 20 mg by mouth daily.      pantoprazole (PROTONIX) 40 MG tablet Take 40 mg by mouth 2 (two) times daily.     potassium chloride (KLOR-CON) 10 MEQ CR tablet Take 20 mEq by mouth daily.     pravastatin (PRAVACHOL) 40 MG tablet Take 1 tablet (40 mg total) by mouth every evening. Qty: 30 tablet, Refills: 6    traMADol (ULTRAM) 50 MG tablet Take 50 mg by mouth 3 (three) times daily.      nitroGLYCERIN (NITROSTAT) 0.4 MG SL tablet Place 1 tablet (0.4 mg total) under the tongue every 5 (five) minutes as needed for chest pain. Qty: 25 tablet, Refills: 3        Disposition   The patient will be discharged in stable condition to home. Discharge Orders    Future Appointments: Provider: Department: Dept Phone: Center:   06/13/2011 1:30 PM Coralyn Helling, MD [facility] [phone] None     Future Orders Please Complete By Expires   Diet - low sodium heart healthy      Increase activity slowly        Driving Restrictions      Comments:   No driving for 3 days.   Lifting restrictions      Comments:   No lifting for 2 weeks.   Sexual Activity Restrictions      Comments:   No sexual activity for 2 weeks.     Follow-up Information    Follow up with Lorine Bears, MD in 2 weeks. (or Gene Serp, PA-C; the office will call you)    Contact information:   [address] [place] [place] [address] [phone]       Follow up with [facility] in 2 weeks. (  Lab visit for:  CBC; office will call)    Contact information:   [address] [place] [address]            Duration of Discharge Encounter: Greater than 30 minutes including physician and PA time.  Signed, Tereso Newcomer, PA-C  10:49 AM 04/29/2011        [address] [place], [place]  [address] Phone: [phone] Fax:  [phone]

## 2011-04-29 NOTE — Progress Notes (Signed)
Paged physician on call for Jenna Diaz to ask if ok to give QHS Lantus 40 units with a CBG of 108.  Physician said that it was ok to give.

## 2011-04-29 NOTE — Progress Notes (Signed)
Patient ID: Jenna Diaz, female   DOB: February 25, 1946, 65 y.o.   MRN: [id number]    SUBJECTIVE: No chest pain.  She walked halls this morning and is not dyspneic.  This is an improvement.      Marland Kitchen amLODipine  5 mg Oral Daily  . aspirin EC  81 mg Oral Daily  . fentaNYL      . Fluticasone-Salmeterol  1 puff Inhalation BID  . gabapentin  100 mg Oral TID  . heparin      . heparin      . insulin aspart  0-15 Units Subcutaneous TID WC  . insulin aspart  12 Units Subcutaneous BID WC  . insulin glargine  40 Units Subcutaneous QHS  . levothyroxine  150 mcg Oral Daily  . lidocaine      . lisinopril  20 mg Oral Daily  . midazolam      . nitroGLYCERIN      . pantoprazole  40 mg Oral BID  . polysaccharide iron  150 mg Oral Daily  . simvastatin  40 mg Oral q1800  . sodium chloride  3 mL Intravenous Q12H  . Ticagrelor  90 mg Oral BID  . traMADol  50 mg Oral TID  . DISCONTD: adenosine (diagnostic)  20 mL Intravenous Once  . DISCONTD: adenosine (diagnostic)  20 mL Intravenous Once  . DISCONTD: fluticasone-salmeterol  2 puff Inhalation BID  . DISCONTD: sodium chloride  3 mL Intravenous Q12H  . DISCONTD: sodium chloride  3 mL Intravenous Q12H      Filed Vitals:   04/29/11 0437 04/29/11 0445 04/29/11 0802 04/29/11 0822  BP:  130/58  130/60  Pulse:  66  62  Temp:  97.5 F (36.4 C)  97.7 F (36.5 C)  TempSrc:  Oral  Oral  Resp:  17  18  Height:      Weight: 117.6 kg (259 lb 4.2 oz)     SpO2:  99% 100% 100%    Intake/Output Summary (Last 24 hours) at 04/29/11 0902 Last data filed at 04/29/11 0400  Gross per 24 hour  Intake   2032 ml  Output   1025 ml  Net   1007 ml    LABS: Basic Metabolic Panel:  Basename 04/29/11 0600  NA 141  K 3.8  CL 105  CO2 28  GLUCOSE 177*  BUN 14  CREATININE 1.44*  CALCIUM 9.0  MG --  PHOS --   Liver Function Tests: No results found for this basename: AST:2,ALT:2,ALKPHOS:2,BILITOT:2,PROT:2,ALBUMIN:2 in the last 72 hours No results found  for this basename: LIPASE:2,AMYLASE:2 in the last 72 hours CBC:  Basename 04/29/11 0600  WBC 8.3  NEUTROABS --  HGB 9.6*  HCT 31.0*  MCV 81.8  PLT 205   RADIOLOGY: No results found.  PHYSICAL EXAM General: NAD Neck: No JVD, no thyromegaly or thyroid nodule.  Lungs: Clear to auscultation bilaterally with normal respiratory effort. CV: Nondisplaced PMI.  Heart regular S1/S2, no S3/S4, no murmur.  No peripheral edema.  Right groin cath site with small ecchymosis. Abdomen: Soft, nontender, no hepatosplenomegaly, no distention.  Neurologic: Alert and oriented x 3.  Psych: Normal affect. Extremities: No clubbing or cyanosis.   ASSESSMENT AND PLAN:  65 yo with history of CAD and PUD has had exertional dyspnea.  Left heart cath showed what appeared to be moderate RCA and LAD lesions.  The LAD lesion was flow-limiting by FFR, so patient had Promus DES to the LAD yesterday.  She says that her  exertional dyspnea has resolved.  - Continue home cardiac meds including ASA 81 mg daily.  Brilinta 90 mg bid for at least a year will be added.  - Cardiac rehab at [facility].  - Followup with Dr. Kirke Corin or Gene in 2 wks at [facility] office.   Marca Ancona 04/29/2011 9:05 AM

## 2011-04-29 NOTE — Progress Notes (Signed)
CARE MANAGEMENT NOTE 04/29/2011  Patient:  Jenna Diaz, Jenna Diaz   Account Number:  [id number]  Date Initiated:  04/29/2011  Documentation initiated by:  [facility]  Subjective/Objective Assessment:   CAD, PUD, cardiac cath with Promus DES to the LAD     Action/Plan:   Anticipated DC Date:  04/29/2011   Anticipated DC Plan:  HOME/SELF CARE      DC Planning Services  CM consult  Medication Assistance      Choice offered to / List presented to:             Status of service:  Completed, signed off Medicare Important Message given?   (If response is "NO", the following Medicare IM given date fields will be blank) Date Medicare IM given:   Date Additional Medicare IM given:    Discharge Disposition:  HOME/SELF CARE  Per UR Regulation:    Comments:  04/30/2011 0944 Contacted pt and states she goes to [facility]. Contacted pharmacy and they do have the Brilinta in stock. Provided pt with  Brilinta savings card that will supply 30 days for free and assist with copay if she qualifies. Provided pt with patient information guide for Brilinta and toll free number to Brilinta access line for any questions and support for medication. Educated pt on the importance of reviewing side effects and warnings for medication. Also discuss with her physician or pharmacist any side effects and call MD if she experience any side effects from medication. Isidoro Donning RN CCM Case Mgmt phone [phone]

## 2011-04-29 NOTE — Progress Notes (Signed)
CARDIAC REHAB PHASE I   PRE:  Rate/Rhythm: SR 64  BP:  Supine:   Sitting: 130/60  Standing:    SaO2: 100 RA  MODE:  Ambulation: 100 ft   POST:  Rate/Rhythem: sr 64-67  BP:  Supine:   Sitting: 127/57  Standing:    SaO2: 100 ra Jenna Diaz  Ambulated 100 feet in hallway with one assist and use of cane.  Pt ambulates slowly and had some earlier complaints of dizziness, this resolved during the ambulation.  Pt to side of bed for breakfast.  Education completed at bedside.

## 2011-04-29 NOTE — Progress Notes (Signed)
>  175 ACT in cath lab holding area.  Checked again at 2000 and the level was 171.  This was adequate for sheath removal.

## 2011-04-29 NOTE — Progress Notes (Signed)
Site area: right groin  Site Prior to Removal:  Level 0  Pressure Applied For 25 MINUTES (artery) at 10 MINUTES for vein  Minutes Beginning at 2107 (artery) and 2130 (vein)  Manual:   yes  Patient Status During Pull:  VSS. Pt had a feeling of warmth; 100 mL bolus was given.  Post Pull Groin Site:  Level 1  Post Pull Instructions Given:  yes  Post Pull Pulses Present:  yes  Dressing Applied:  yes  Comments:  Stable.  Some bruising and a 2cm sized hematoma at site.  Marked and will continue to assess.

## 2011-05-01 ENCOUNTER — Telehealth: Payer: Self-pay | Admitting: *Deleted

## 2011-05-01 DIAGNOSIS — D649 Anemia, unspecified: Secondary | ICD-10-CM

## 2011-05-01 DIAGNOSIS — Z7902 Long term (current) use of antithrombotics/antiplatelets: Secondary | ICD-10-CM

## 2011-05-01 LAB — POCT I-STAT 3, VENOUS BLOOD GAS (G3P V)
pCO2, Ven: 46.2 mmHg (ref 45.0–50.0)
pH, Ven: 7.353 — ABNORMAL HIGH (ref 7.250–7.300)
pO2, Ven: 36 mmHg (ref 30.0–45.0)

## 2011-05-01 NOTE — Telephone Encounter (Signed)
Pt aware to go to the [facility] for labs a few days before appt w/Dr. Kirke Corin in January per voicemail left by Tereso Newcomer, PA-C.

## 2011-05-03 ENCOUNTER — Encounter (HOSPITAL_COMMUNITY): Payer: Self-pay

## 2011-05-04 LAB — POCT ACTIVATED CLOTTING TIME: Activated Clotting Time: 171 seconds

## 2011-05-18 ENCOUNTER — Ambulatory Visit (INDEPENDENT_AMBULATORY_CARE_PROVIDER_SITE_OTHER): Payer: Medicare PPO | Admitting: Cardiovascular Disease

## 2011-05-18 ENCOUNTER — Telehealth: Payer: Self-pay | Admitting: *Deleted

## 2011-05-18 ENCOUNTER — Encounter: Payer: Self-pay | Admitting: Cardiovascular Disease

## 2011-05-18 DIAGNOSIS — I2510 Atherosclerotic heart disease of native coronary artery without angina pectoris: Secondary | ICD-10-CM

## 2011-05-18 DIAGNOSIS — E785 Hyperlipidemia, unspecified: Secondary | ICD-10-CM

## 2011-05-18 DIAGNOSIS — I10 Essential (primary) hypertension: Secondary | ICD-10-CM

## 2011-05-18 NOTE — Patient Instructions (Signed)
Your physician wants you to follow-up in: 4 months. You will receive a reminder letter in the mail one-two months in advance. If you don't receive a letter, please call our office to schedule the follow-up appointment. Your physician recommends that you continue on your current medications as directed. Please refer to the Current Medication list given to you today. 

## 2011-05-18 NOTE — Assessment & Plan Note (Signed)
The patient had recent cardiac evaluation that included a right and left cardiac catheterization. Pressure wire interrogation of the LAD showed that the lesion was significant. She underwent an angioplasty and drug-eluting stent placement without complications. She reports significant improvement in symptoms. I recommend continuing dual antiplatelet therapy for one year basically until the end of December of 2013. She seems to be tolerating this fine and her hemoglobin has actually improved. Continue lifestyle changes. She is going to start cardiac rehabilitation next week which hopefully would further improve her dyspnea.

## 2011-05-18 NOTE — Assessment & Plan Note (Signed)
Her most recent lipid profile showed an LDL of 99. Since then, the dose of Pravachol was increased to 40 mg. She will need a followup lipid profile in the near future.

## 2011-05-18 NOTE — Assessment & Plan Note (Signed)
Her blood pressure is well controlled. Continue current medications. 

## 2011-05-18 NOTE — Progress Notes (Signed)
HPI  This is a 66 year old female who is here today for a followup visit. During her last office visit she had significant dyspnea. I admitted her from clinic to [facility] where she ruled out for myocardial infarction. She was then transferred to [facility] for repeat cardiac catheterization and pressure wire interrogation of the LAD and RCA. She had a right and left cardiac catheterization. There was normal filling pressures and no evidence of pulmonary hypertension. There was a 60-70% mid LAD stenosis which was significant by FFR. The stenosis in the proximal RCA was not significant. She underwent an angioplasty and drug-eluting stent placement to the LAD without complications. The patient has previous history of anemia and stomach ulcer which has been monitored by EGD. She is tolerating aspirin as well as Brilinta without any reported bleeding complications. She had CBC performed which actually showed improvement in her hemoglobin to 11.3. She reports significant improvement in her dyspnea and feels much better.  No Known Allergies   Current Outpatient Prescriptions on File Prior to Visit  Medication Sig Dispense Refill  . amLODipine (NORVASC) 5 MG tablet Take 1 tablet (5 mg total) by mouth daily.  30 tablet  6  . aspirin EC 81 MG tablet Take 81 mg by mouth daily.        . fluticasone-salmeterol (ADVAIR HFA) 115-21 MCG/ACT inhaler Inhale 2 puffs into the lungs 2 (two) times daily.        . furosemide (LASIX) 40 MG tablet Take 20 mg by mouth daily as needed. For fluid retention       . gabapentin (NEURONTIN) 100 MG capsule Take 100 mg by mouth 3 (three) times daily.        . insulin aspart (NOVOLOG FLEXPEN) 100 UNIT/ML injection Inject 12 Units into the skin 2 (two) times daily.        . insulin glargine (LANTUS) 100 UNIT/ML injection Inject into the skin at bedtime. Take 45 units every morning and 40 units every evening       . levothyroxine (SYNTHROID, LEVOTHROID) 150 MCG tablet  Take 150 mcg by mouth daily.        Marland Kitchen lisinopril (PRINIVIL,ZESTRIL) 20 MG tablet Take 20 mg by mouth daily.        . nitroGLYCERIN (NITROSTAT) 0.4 MG SL tablet Place 1 tablet (0.4 mg total) under the tongue every 5 (five) minutes as needed for chest pain.  25 tablet  3  . pantoprazole (PROTONIX) 40 MG tablet Take 40 mg by mouth 2 (two) times daily.       . potassium chloride (KLOR-CON) 10 MEQ CR tablet Take 20 mEq by mouth daily.       . pravastatin (PRAVACHOL) 40 MG tablet Take 1 tablet (40 mg total) by mouth every evening.  30 tablet  6  . Ticagrelor (BRILINTA) 90 MG TABS tablet Take 1 tablet (90 mg total) by mouth 2 (two) times daily.  60 tablet  11  . traMADol (ULTRAM) 50 MG tablet Take 50 mg by mouth 3 (three) times daily.           Past Medical History  Diagnosis Date  . Diabetes mellitus   . Essential hypertension   . Hypothyroidism   . Anemia   . GERD (gastroesophageal reflux disease)     PUD with UGI bleed in 8/12 dx by EGD  . Hyperlipemia   . Bowel obstruction 1997  . Osteoarthritis   . Chronic kidney disease     questionable  .  Gastric ulcer   . CAD (coronary artery disease)     A. 11/2010 - Cath: 60% LAD, 50% RCA ;  B. s/p Promus DES to LAD (by FFR 12/12);  RCA ok by FFR 12/12  . Shortness of breath      Past Surgical History  Procedure Date  . Colon resection 1990's  . Hand surgery     cyst on left hand  . Nissen fundoplication     questionable - pt unsure of surgical name  . Gastrostomy w/ feeding tube 1990's  . Colon surgery     COLONECTOMY WEARS A BAG  . Abdominal hysterectomy 1970's  . Dilation and curettage of uterus     "before hysterectomy"  . Cardiac catheterization 11/2010    moderate 2 vessel CAD. LAD 60%, RCA 50%. Diffuse obstructive disease in distal LCX.   Marland Kitchen Coronary angioplasty with stent placement 04/28/11    Mid LAD: Promus DES (FFR: 0.79). No significant pulmonary hypertension with normal filling pressures.      Family History  Problem  Relation Age of Onset  . Heart disease Mother     MI  . Heart disease Maternal Grandmother   . Heart disease Paternal Grandmother   . Ovarian cancer Sister   . Diabetes Sister     x3  . Heart disease Brother   . Esophageal cancer Father   . Colon cancer Neg Hx   . Stomach cancer Neg Hx      History   Social History  . Marital Status: Single    Spouse Name: N/A    Number of Children: 3  . Years of Education: N/A   Occupational History  . disability    Social History Main Topics  . Smoking status: Never Smoker   . Smokeless tobacco: Never Used  . Alcohol Use: No  . Drug Use: No  . Sexually Active: No   Other Topics Concern  . Not on file   Social History Narrative  . No narrative on file     PHYSICAL EXAM   BP 101/68  Pulse 65  Ht 5\' 7"  (1.702 m)  Wt 263 lb (119.296 kg)  BMI 41.19 kg/m2  Constitutional: She is oriented to person, place, and time. She appears well-developed and well-nourished. No distress.  HENT: No nasal discharge.  Head: Normocephalic and atraumatic.  Eyes: Pupils are equal, round, and reactive to light. Right eye exhibits no discharge. Left eye exhibits no discharge.  Neck: Normal range of motion. Neck supple. No JVD present. No thyromegaly present.  Cardiovascular: Normal rate, regular rhythm, normal heart sounds. Exam reveals no gallop and no friction rub.  No murmur heard.  Pulmonary/Chest: Effort normal and breath sounds normal. No stridor. No respiratory distress. She has no wheezes. She has no rales. She exhibits no tenderness.  Abdominal: Soft. Bowel sounds are normal. She exhibits no distension. There is no tenderness. There is no rebound and no guarding.  Musculoskeletal: Normal range of motion. She exhibits no edema and no tenderness.  Neurological: She is alert and oriented to person, place, and time. Coordination normal.  Skin: Skin is warm and dry. No rash noted. She is not diaphoretic. No erythema. No pallor.  Psychiatric:  She has a normal mood and affect. Her behavior is normal. Judgment and thought content normal.  Right groin: No evidence of hematoma or bruit.   ASSESSMENT AND PLAN

## 2011-05-18 NOTE — Telephone Encounter (Addendum)
Called pt to confirm that she is taking Brilanta.  Pt says she began taking this med the end of December 2012 and had stent placement.  PV and colonoscopy scheduled for 2/5 cancelled and office visit with Dr. Marina Goodell scheduled for 2/7.  Ezra Sites

## 2011-06-06 ENCOUNTER — Encounter: Payer: Medicare PPO | Admitting: Internal Medicine

## 2011-06-08 ENCOUNTER — Encounter: Payer: Self-pay | Admitting: Internal Medicine

## 2011-06-08 ENCOUNTER — Ambulatory Visit (INDEPENDENT_AMBULATORY_CARE_PROVIDER_SITE_OTHER): Payer: Medicare PPO | Admitting: Internal Medicine

## 2011-06-08 DIAGNOSIS — I2510 Atherosclerotic heart disease of native coronary artery without angina pectoris: Secondary | ICD-10-CM

## 2011-06-08 DIAGNOSIS — K259 Gastric ulcer, unspecified as acute or chronic, without hemorrhage or perforation: Secondary | ICD-10-CM

## 2011-06-08 DIAGNOSIS — E119 Type 2 diabetes mellitus without complications: Secondary | ICD-10-CM

## 2011-06-08 NOTE — Patient Instructions (Signed)
You have been scheduled for an Endoscopy, instructions have been provided. You will stay on your Brilinta per Dr Marina Goodell. You will hold your insulin the morning of your procedure.

## 2011-06-08 NOTE — Progress Notes (Signed)
HISTORY OF PRESENT ILLNESS:  Jenna Diaz is a 66 y.o. 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 I last saw the patient the December 20th 2012 for followup of her gastric ulcer. The ulcer was not healed. Biopsies were negative for malignancy. No evidence of H. Pylori. She was continued on pantoprazole twice a day and to avoid NSAIDs other than cardiac aspirin. She was having problems with shortness of breath and was subsequently hospitalized and underwent cardiac catheterization with coronary artery stent placement 04/29/2011. She is now on Brilinta in addition to aspirin. She tells me that she is feeling quite well. She is involved with cardiac rehabilitation. Her abdominal pains have improved significantly, but she still describes short-lived sharp pains in the upper abdomen. Shortness of breath resolved.  REVIEW OF SYSTEMS:  All non-GI ROS negative except for arthritis pain  Past Medical History  Diagnosis Date  . Diabetes mellitus   . Essential hypertension   . Hypothyroidism   . Anemia   . GERD (gastroesophageal reflux disease)     PUD with UGI bleed in 8/12 dx by EGD  . Hyperlipemia   . Bowel obstruction 1997  . Osteoarthritis   . Chronic kidney disease     questionable  . Gastric ulcer   . CAD (coronary artery disease)     A. 11/2010 - Cath: 60% LAD, 50% RCA ;  B. s/p Promus DES to LAD (by FFR 12/12);  RCA ok by FFR 12/12  . Shortness of breath     Past Surgical History  Procedure Date  . Colon resection 1990's  . Hand surgery     cyst on left hand  . Nissen fundoplication     questionable - pt unsure of surgical name  . Gastrostomy w/ feeding tube 1990's  . Colon surgery     COLONECTOMY WEARS A  BAG  . Abdominal hysterectomy 1970's  . Dilation and curettage of uterus     "before hysterectomy"  . Cardiac catheterization 11/2010    moderate 2 vessel CAD. LAD 60%, RCA 50%. Diffuse obstructive disease in distal LCX.   Marland Kitchen Coronary angioplasty with stent placement 04/28/11    Mid LAD: Promus DES (FFR: 0.79). No significant pulmonary hypertension with normal filling pressures.     Social History Jenna Diaz  reports that she has never smoked. She has never used smokeless tobacco. She reports that she does not drink alcohol or use illicit drugs.  family history includes Diabetes in her sister; Esophageal cancer in her father; Heart disease in her brother, maternal grandmother, mother, and paternal grandmother; and Ovarian cancer in her sister.  There is no history of Colon cancer and Stomach cancer.  No Known Allergies     PHYSICAL EXAMINATION: Vital signs: BP 130/78  Pulse 68  Ht 5\' 7"  (1.702 m)  Wt 266 lb (120.657 kg)  BMI 41.66 kg/m2  SpO2 98% General:obese, Well-developed, well-nourished, no acute distress HEENT: Sclerae are anicteric, conjunctiva pink. Oral mucosa intact Lungs: Clear Heart: Regular Abdomen: soft,obese, nontender, nondistended, no obvious ascites, no peritoneal signs, normal bowel sounds. No organomegaly. Extremities: No edema Psychiatric: alert and oriented x3. Cooperative    ASSESSMENT:  #1. NSAID-induced gastric ulcer. Last upper endoscopy 04/20/2011. Ulcer healing has not been established #2. Multiple significant medical problems, including coronary artery disease, obesity, and diabetes #3.  Now on Brilinta after recent coronary artery stent placement  PLAN:  #1. I discussed with the patient prospects a relook endoscopy to assure ulcer healing. She is very interested. She is high-risk, but stable without contraindication.The nature of the procedure, as well as the risks, benefits, and alternatives were carefully and thoroughly reviewed with  the patient. Ample time for discussion and questions allowed. The patient understood, was satisfied, and agreed to proceed. She will hold her diabetic medications the day of the procedure until resuming oral intake. #2. Continue twice a day PPI and avoidance of NSAIDs other than baby aspirin

## 2011-06-13 ENCOUNTER — Ambulatory Visit (INDEPENDENT_AMBULATORY_CARE_PROVIDER_SITE_OTHER): Payer: Medicare PPO | Admitting: Pulmonary Disease

## 2011-06-13 ENCOUNTER — Encounter: Payer: Self-pay | Admitting: Pulmonary Disease

## 2011-06-13 ENCOUNTER — Ambulatory Visit (INDEPENDENT_AMBULATORY_CARE_PROVIDER_SITE_OTHER)
Admission: RE | Admit: 2011-06-13 | Discharge: 2011-06-13 | Disposition: A | Discharge status: Home / Self Care | Payer: Medicare PPO | Source: Ambulatory Visit | Attending: Pulmonary Disease | Admitting: Pulmonary Disease

## 2011-06-13 VITALS — BP 112/72 | HR 63 | Temp 97.9°F | Ht 67.0 in | Wt 266.0 lb

## 2011-06-13 DIAGNOSIS — R0600 Dyspnea, unspecified: Secondary | ICD-10-CM

## 2011-06-13 DIAGNOSIS — R0989 Other specified symptoms and signs involving the circulatory and respiratory systems: Secondary | ICD-10-CM

## 2011-06-13 MED ORDER — ALBUTEROL SULFATE HFA 108 (90 BASE) MCG/ACT IN AERS: 2.0000 | INHALATION_SPRAY | Freq: Four times a day (QID) | RESPIRATORY_TRACT | Status: DC | PRN

## 2011-06-13 NOTE — Progress Notes (Signed)
Chief Complaint  Patient presents with  . Follow-up    W/ CXR. Pt states her breathing is getting better but still gets SOB. c/o occasional wheezing, chest tightness. denies any cough    History of Present Illness: Jenna Diaz is a 66 y.o. female with dyspnea.  She has been using her nebulizer, and this helps.  She uses this about 3 or 4 times per week.  She gets occasional cough with thick, clear sputum.  She also has a dry throat, and occasionally gets chest tightness.  Past Medical History  Diagnosis Date  . Diabetes mellitus   . Essential hypertension   . Hypothyroidism   . Anemia   . GERD (gastroesophageal reflux disease)     PUD with UGI bleed in 8/12 dx by EGD  . Hyperlipemia   . Bowel obstruction 1997  . Osteoarthritis   . Chronic kidney disease     questionable  . Gastric ulcer   . CAD (coronary artery disease)     A. 11/2010 - Cath: 60% LAD, 50% RCA ;  B. s/p Promus DES to LAD (by FFR 12/12);  RCA ok by FFR 12/12  . Shortness of breath     Past Surgical History  Procedure Date  . Colon resection 1990's  . Hand surgery     cyst on left hand  . Nissen fundoplication     questionable - pt unsure of surgical name  . Gastrostomy w/ feeding tube 1990's  . Colon surgery     COLONECTOMY WEARS A BAG  . Abdominal hysterectomy 1970's  . Dilation and curettage of uterus     "before hysterectomy"  . Cardiac catheterization 11/2010    moderate 2 vessel CAD. LAD 60%, RCA 50%. Diffuse obstructive disease in distal LCX.   Marland Kitchen Coronary angioplasty with stent placement 04/28/11    Mid LAD: Promus DES (FFR: 0.79). No significant pulmonary hypertension with normal filling pressures.     Blood pressure 112/72, pulse 63, temperature 97.9 F (36.6 C), temperature source Oral, height 5\' 7"  (1.702 m), weight 266 lb (120.657 kg), SpO2 97.00%. Body mass index is 41.66 kg/(m^2).  Physical Exam:  General - Obese HEENT - PERRLA, EOMI, no sinus tenderness, no oral exudate, no  LAN, no thyromegaly Cardiac - s1s2 regular, no murmur Chest - no wheeze/rales/dullness Abdomen - soft, colostomy RLQ, no organomegaly Extremities - 1+ edema, no cyanosis/clubbing Neurologic - normal strength, CN intact Skin - no rashes Psychiatric - normal mood, behavior  CT chest 03/16/11>>RML GGO, LLL GGO, ascending thoracic aorta 4.4 cm, mild CM  PFT 01/26/11>>FEV1 2.35(101%), FEV1% 86, TLC 2.41(111%), DLCO 33%, no BD  Dg Chest 2 View  06/13/2011  *RADIOLOGY REPORT*   Clinical Data: Chest pain and shortness of breath.  CHEST - 2 VIEW  Comparison: None.  Findings: Lungs are clear.  Heart size is normal.  No pneumothorax or effusion.   IMPRESSION: No acute disease.   Original Report Authenticated By: Bernadene Bell. Maricela Curet, M.D.    Assessment/Plan:  Outpatient Encounter Prescriptions as of 06/13/2011  Medication Sig Dispense Refill  . amLODipine (NORVASC) 5 MG tablet Take 1 tablet (5 mg total) by mouth daily.  30 tablet  6  . aspirin EC 81 MG tablet Take 81 mg by mouth daily.        . Ferrous Sulfate Dried (FEOSOL) 200 (65 FE) MG TABS Take 2 tablets by mouth every morning.      . fluticasone-salmeterol (ADVAIR HFA) 115-21 MCG/ACT inhaler Inhale 2 puffs  into the lungs 2 (two) times daily.        . furosemide (LASIX) 40 MG tablet Take 20 mg by mouth daily as needed. For fluid retention       . gabapentin (NEURONTIN) 100 MG capsule Take 100 mg by mouth 3 (three) times daily.        . insulin aspart (NOVOLOG FLEXPEN) 100 UNIT/ML injection Inject 12 Units into the skin 2 (two) times daily.        . insulin glargine (LANTUS) 100 UNIT/ML injection Inject into the skin at bedtime. Take 45 units every morning and 40 units every evening       . levothyroxine (SYNTHROID, LEVOTHROID) 150 MCG tablet Take 150 mcg by mouth daily.        Marland Kitchen lisinopril (PRINIVIL,ZESTRIL) 20 MG tablet Take 20 mg by mouth daily.        . nitroGLYCERIN (NITROSTAT) 0.4 MG SL tablet Place 1 tablet (0.4 mg total) under the  tongue every 5 (five) minutes as needed for chest pain.  25 tablet  3  . pantoprazole (PROTONIX) 40 MG tablet Take 40 mg by mouth 2 (two) times daily.       . potassium chloride (KLOR-CON) 10 MEQ CR tablet Take 20 mEq by mouth daily.       . pravastatin (PRAVACHOL) 40 MG tablet Take 1 tablet (40 mg total) by mouth every evening.  30 tablet  6  . Ticagrelor (BRILINTA) 90 MG TABS tablet Take 1 tablet (90 mg total) by mouth 2 (two) times daily.  60 tablet  11  . traMADol (ULTRAM) 50 MG tablet Take 50 mg by mouth 3 (three) times daily.          Lakeith Careaga Pager:  [phone] 06/13/2011, 2:26 PM

## 2011-06-13 NOTE — Assessment & Plan Note (Addendum)
She can continue albuterol as needed.  Will have her stop advair for now.  Advised her to discuss with cardiology about whether ACE inhibitor use could be contributing to some of her upper airway issues.

## 2011-06-13 NOTE — Patient Instructions (Signed)
Proair two puffs as needed for cough, wheezing, or chest congestion Follow up in 6 months

## 2011-06-15 ENCOUNTER — Encounter: Payer: Self-pay | Admitting: Internal Medicine

## 2011-06-15 ENCOUNTER — Ambulatory Visit (AMBULATORY_SURGERY_CENTER): Payer: Medicare PPO | Admitting: Internal Medicine

## 2011-06-15 ENCOUNTER — Other Ambulatory Visit: Payer: Self-pay | Admitting: *Deleted

## 2011-06-15 VITALS — BP 149/82 | HR 57 | Temp 97.0°F | Resp 15 | Ht 67.0 in | Wt 266.0 lb

## 2011-06-15 DIAGNOSIS — K2960 Other gastritis without bleeding: Secondary | ICD-10-CM

## 2011-06-15 DIAGNOSIS — E785 Hyperlipidemia, unspecified: Secondary | ICD-10-CM

## 2011-06-15 DIAGNOSIS — K259 Gastric ulcer, unspecified as acute or chronic, without hemorrhage or perforation: Secondary | ICD-10-CM

## 2011-06-15 DIAGNOSIS — Z79899 Other long term (current) drug therapy: Secondary | ICD-10-CM

## 2011-06-15 LAB — GLUCOSE, CAPILLARY
Glucose-Capillary: 137 mg/dL — ABNORMAL HIGH (ref 70–99)
Glucose-Capillary: 127 mg/dL — ABNORMAL HIGH (ref 70–99)

## 2011-06-15 MED ORDER — SODIUM CHLORIDE 0.9 % IV SOLN: 500.0000 mL | INTRAVENOUS | Status: DC

## 2011-06-15 NOTE — Op Note (Signed)
[facility] [address] [place], [place]  [address]  ENDOSCOPY PROCEDURE REPORT  PATIENT:  Jenna Diaz, Jenna Diaz  MR#:  [id number] BIRTHDATE:  10-23-1945, 65 yrs. old  GENDER:  female  ENDOSCOPIST:  Wilhemina Bonito. Eda Keys, MD Referred by:  Office  PROCEDURE DATE:  06/15/2011 PROCEDURE:  EGD with biopsy, [id number] ASA CLASS:  Class III INDICATIONS:  follow-up of gastric ulcer  (last EGD 04-20-11)  MEDICATIONS:   MAC sedation, administered by CRNA, propofol (Diprivan) 150 mg IV TOPICAL ANESTHETIC:  none  DESCRIPTION OF PROCEDURE:   After the risks benefits and alternatives of the procedure were thoroughly explained, informed consent was obtained.  The LB [id number] endoscope was introduced through the mouth and advanced to the second portion of the duodenum, without limitations.  The instrument was slowly withdrawn as the mucosa was fully examined. <<PROCEDUREIMAGES>>  A healing ulcer was noted in the antrum, with residual erythema and irregular mucosa. Bx taken.  Otherwise the examination of the esophagus, stomach, and duodenum  was normal.    Retroflexed views revealed no abnormalities.    The scope was then withdrawn from the patient and the procedure completed.  COMPLICATIONS:  None  ENDOSCOPIC IMPRESSION: 1) Ulcer, healed in the antrum 2) Otherwise normal examination  RECOMMENDATIONS: 1) Avoid NSAIDS (other than Baby asa for heart) 2) Continue PANTOPRAZOLE (PROTONIX) FOREVER 3) GI FOLLOW UP PRN  ______________________________ Wilhemina Bonito. Eda Keys, MD  CC:  Kirstie Peri, MD;  The Patient  n. eSIGNED:   Wilhemina Bonito. Eda Keys at 06/15/2011 09:25 AM  [name], [phone]

## 2011-06-15 NOTE — Patient Instructions (Signed)
Discharge instructions given with verbal understanding. Handouts on a soft diet given. Resume previous medications. 

## 2011-06-15 NOTE — Progress Notes (Signed)
Patient did not experience any of the following events: a burn prior to discharge; a fall within the facility; wrong site/side/patient/procedure/implant event; or a hospital transfer or hospital admission upon discharge from the facility. (G8907) Patient did not have preoperative order for IV antibiotic SSI prophylaxis. (G8918)  

## 2011-06-16 ENCOUNTER — Telehealth: Payer: Self-pay | Admitting: *Deleted

## 2011-06-16 NOTE — Telephone Encounter (Signed)
Attempted number given in admitting yesterday 5 times each resulting in busy signal. No message able to be left. ewm

## 2011-06-21 ENCOUNTER — Encounter: Payer: Self-pay | Admitting: Internal Medicine

## 2011-06-22 NOTE — Telephone Encounter (Signed)
completed

## 2011-06-27 ENCOUNTER — Encounter: Payer: Self-pay | Admitting: Internal Medicine

## 2011-06-27 ENCOUNTER — Ambulatory Visit (INDEPENDENT_AMBULATORY_CARE_PROVIDER_SITE_OTHER): Payer: Medicare PPO | Admitting: Internal Medicine

## 2011-06-27 DIAGNOSIS — K219 Gastro-esophageal reflux disease without esophagitis: Secondary | ICD-10-CM

## 2011-06-27 DIAGNOSIS — K259 Gastric ulcer, unspecified as acute or chronic, without hemorrhage or perforation: Secondary | ICD-10-CM

## 2011-06-27 NOTE — Patient Instructions (Signed)
No charge for today's visit. GI followup as needed. Return to her primary provider.

## 2011-06-27 NOTE — Progress Notes (Signed)
HISTORY OF PRESENT ILLNESS:  Jenna Diaz is a 66 y.o. female with multiple medical problems as listed below. I have been following the patient for abdominal pain and nonhealing gastric ulcer. Last underwent upper endoscopy 06/15/2011. At that time, the antral ulcer was found to be healed with some surrounding inflammation. This was biopsied and returned negative. She continues on twice a day PPI and avoidance of all NSAIDs except for cardioprotective aspirin. Continue the cardiac rehabilitation. No GI complaints. Today's office visit was made inadvertently by the staff. I have reviewed her endoscopy and biopsy findings. She is accompanied by her daughter. She does have several non-GI complaints as listed below. She has been referred to her primary provider regarding these.  REVIEW OF SYSTEMS:  All non-GI ROS negative except for itching hair, less frequent urination, chest wall pain on the left  Past Medical History  Diagnosis Date  . Diabetes mellitus   . Essential hypertension   . Hypothyroidism   . Anemia   . GERD (gastroesophageal reflux disease)     PUD with UGI bleed in 8/12 dx by EGD  . Hyperlipemia   . Bowel obstruction 1997  . Osteoarthritis   . Chronic kidney disease     questionable  . Gastric ulcer   . CAD (coronary artery disease)     A. 11/2010 - Cath: 60% LAD, 50% RCA ;  B. s/p Promus DES to LAD (by FFR 12/12);  RCA ok by FFR 12/12  . Shortness of breath     Past Surgical History  Procedure Date  . Colon resection 1990's  . Hand surgery     cyst on left hand  . Nissen fundoplication     questionable - pt unsure of surgical name  . Gastrostomy w/ feeding tube 1990's  . Colon surgery     COLONECTOMY WEARS A BAG  . Abdominal hysterectomy 1970's  . Dilation and curettage of uterus     "before hysterectomy"  . Cardiac catheterization 11/2010    moderate 2 vessel CAD. LAD 60%, RCA 50%. Diffuse obstructive disease in distal LCX.   Marland Kitchen Coronary angioplasty with  stent placement 04/28/11    Mid LAD: Promus DES (FFR: 0.79). No significant pulmonary hypertension with normal filling pressures.     Social History ALLEYAH TWOMBLY  reports that she has never smoked. She has never used smokeless tobacco. She reports that she does not drink alcohol or use illicit drugs.  family history includes Diabetes in her sister; Esophageal cancer in her father; Heart disease in her brother, maternal grandmother, mother, and paternal grandmother; and Ovarian cancer in her sister.  There is no history of Colon cancer and Stomach cancer.  No Known Allergies     PHYSICAL EXAMINATION: Vital signs: BP 118/70  Pulse 60  Ht 5\' 6"  (1.676 m)  Wt 264 lb (119.75 kg)  BMI 42.61 kg/m2 General: Well-developed, well-nourished, no acute distress HEENT: Sclerae are anicteric, conjunctiva pink. Oral mucosa intact Lungs: Clear Heart: Regular. Tender chest wall in the left that reproduces her intermittent discomfort Abdomen: soft, obese, nontender, nondistended, no obvious ascites, no peritoneal signs, normal bowel sounds. No organomegaly. Ostomy unremarkable. Extremities: No edema Psychiatric: alert and oriented x3. Cooperative    ASSESSMENT:  #1. NSAID-induced gastric ulcer. Followed to healing #2. GERD #3. History of prior total colectomy with ileostomy, elsewhere   PLAN:  #1. Continue twice a day PPI #2. Continue to avoid all NSAIDs except for cardioprotective aspirin #3. Reflux precautions with attention to  weight loss #4. See PCP regarding non-GI complaints #5. GI followup when necessary  ADDENDUM:  As this office appointment was inadvertently scheduled by our staff, she will not be charged for this encounter

## 2011-07-04 ENCOUNTER — Telehealth: Payer: Self-pay | Admitting: *Deleted

## 2011-07-04 MED ORDER — PRAVASTATIN SODIUM 80 MG PO TABS: 80.0000 mg | ORAL_TABLET | Freq: Every evening | ORAL | Status: DC

## 2011-07-04 NOTE — Telephone Encounter (Signed)
Left message to call back with female at residence.  

## 2011-07-04 NOTE — Telephone Encounter (Signed)
Pt notified of results and med change plan. Pt verbalized understanding.

## 2011-07-04 NOTE — Telephone Encounter (Signed)
Message copied by Arlyss Gandy on Tue Jul 04, 2011  9:40 AM ------      Message from: Rande Brunt      Created: Mon Jul 03, 2011  5:37 PM       No sig improvement in LDL, now at 31. Therefore, recommend increasing Pravachol to 80 mg, and repeat FLP in 12 weeks.

## 2011-09-26 ENCOUNTER — Telehealth: Payer: Self-pay | Admitting: *Deleted

## 2011-09-26 DIAGNOSIS — Z79899 Other long term (current) drug therapy: Secondary | ICD-10-CM

## 2011-09-26 DIAGNOSIS — E785 Hyperlipidemia, unspecified: Secondary | ICD-10-CM

## 2011-09-26 NOTE — Telephone Encounter (Signed)
Left message for patient to call office with sister Marton Redwood.

## 2011-09-26 NOTE — Telephone Encounter (Signed)
Patient returned call and left message on voicemail of nurse. Nurse returned call and phone just rang, unable to leave message.

## 2011-09-26 NOTE — Telephone Encounter (Signed)
Message copied by Eustace Moore on Tue Sep 26, 2011  1:57 PM ------      Message from: Arlyss Gandy      Created: Tue Jul 04, 2011  1:14 PM      Regarding: FLP/LFT       Pt due for FLP/LFT 12 WKS FROM 3/5---272.4, V58.69---ENTER ORDER AND MAIL TO PT WITH REMINDER.

## 2011-09-27 ENCOUNTER — Other Ambulatory Visit: Payer: Self-pay | Admitting: *Deleted

## 2011-09-27 DIAGNOSIS — E785 Hyperlipidemia, unspecified: Secondary | ICD-10-CM

## 2011-09-27 MED ORDER — AMLODIPINE BESYLATE 5 MG PO TABS: 5.0000 mg | ORAL_TABLET | Freq: Every day | ORAL | Status: DC

## 2011-09-27 NOTE — Telephone Encounter (Signed)
Patient informed and lab order faxed to [facility] lab. 

## 2011-10-02 ENCOUNTER — Other Ambulatory Visit: Payer: Self-pay | Admitting: Cardiology

## 2011-10-03 ENCOUNTER — Ambulatory Visit (INDEPENDENT_AMBULATORY_CARE_PROVIDER_SITE_OTHER): Payer: Medicare PPO | Admitting: Cardiology

## 2011-10-03 ENCOUNTER — Encounter: Payer: Self-pay | Admitting: Cardiology

## 2011-10-03 VITALS — BP 123/70 | HR 66 | Resp 18 | Ht 67.0 in | Wt 261.0 lb

## 2011-10-03 DIAGNOSIS — I2510 Atherosclerotic heart disease of native coronary artery without angina pectoris: Secondary | ICD-10-CM

## 2011-10-03 DIAGNOSIS — R609 Edema, unspecified: Secondary | ICD-10-CM | POA: Insufficient documentation

## 2011-10-03 MED ORDER — MEDICAL COMPRESSION STOCKINGS MISC: 1.0000 | Status: DC

## 2011-10-03 MED ORDER — LOSARTAN POTASSIUM 50 MG PO TABS: 50.0000 mg | ORAL_TABLET | Freq: Every day | ORAL | Status: DC

## 2011-10-03 NOTE — Progress Notes (Signed)
HPI This is a 66 year old female who is here today for a followup of CAD. She has had a recent history of dypsnea.  Right and left cardiac catheterization demonstrated normal filling pressures and no evidence of pulmonary hypertension. There was a 60-70% mid LAD stenosis which was significant by FFR. The stenosis in the proximal RCA was not significant. She underwent an angioplasty and drug-eluting stent placement to the LAD without complications.  Since she was last seen here she occasionally gets some fluttering in her chest. She says she is somewhat limited in her activities. She'll get a little short of breath trying to walk. She did complete cardiac rehabilitation. I reviewed previous records and note that she has seen Dr. Craige Cotta.  He is treating her for dyspnea.  The etiology of this is not clear. He did mention in his note that he wanted ACE inhibitors could be contributing to some complaints. She denies any PND or orthopnea. She's not having any chest pressure, neck or arm discomfort. She actually thinks she improved after stenting last December.  No Known Allergies  Current Outpatient Prescriptions  Medication Sig Dispense Refill  . albuterol (PROAIR HFA) 108 (90 BASE) MCG/ACT inhaler Inhale 2 puffs into the lungs every 6 (six) hours as needed for wheezing.  1 Inhaler  5  . amLODipine (NORVASC) 5 MG tablet Take 1 tablet (5 mg total) by mouth daily.  30 tablet  6  . aspirin EC 81 MG tablet Take 81 mg by mouth daily.        . Ferrous Sulfate Dried (FEOSOL) 200 (65 FE) MG TABS Take 2 tablets by mouth every morning.      . fluticasone-salmeterol (ADVAIR HFA) 115-21 MCG/ACT inhaler Inhale 2 puffs into the lungs 2 (two) times daily.        . furosemide (LASIX) 40 MG tablet Take 20 mg by mouth daily as needed. For fluid retention       . gabapentin (NEURONTIN) 100 MG capsule Take 100 mg by mouth 3 (three) times daily.        Marland Kitchen glipiZIDE (GLUCOTROL XL) 2.5 MG 24 hr tablet Take 2.5 mg by mouth  daily.      . insulin aspart (NOVOLOG FLEXPEN) 100 UNIT/ML injection Inject 12 Units into the skin 2 (two) times daily.        . insulin glargine (LANTUS) 100 UNIT/ML injection Inject into the skin at bedtime. Take 45 units every morning and 40 units every evening       . levothyroxine (SYNTHROID, LEVOTHROID) 150 MCG tablet Take 150 mcg by mouth daily.        Marland Kitchen lisinopril (PRINIVIL,ZESTRIL) 20 MG tablet Take 20 mg by mouth daily.        . nitroGLYCERIN (NITROSTAT) 0.4 MG SL tablet Place 1 tablet (0.4 mg total) under the tongue every 5 (five) minutes as needed for chest pain.  25 tablet  3  . pantoprazole (PROTONIX) 40 MG tablet Take 40 mg by mouth 2 (two) times daily.       . potassium chloride (KLOR-CON) 10 MEQ CR tablet Take 20 mEq by mouth daily.       . pravastatin (PRAVACHOL) 80 MG tablet Take 1 tablet (80 mg total) by mouth every evening.  30 tablet  6  . Ticagrelor (BRILINTA) 90 MG TABS tablet Take 1 tablet (90 mg total) by mouth 2 (two) times daily.  60 tablet  11  . traMADol (ULTRAM) 50 MG tablet Take 50 mg by mouth  3 (three) times daily.          Past Medical History  Diagnosis Date  . Diabetes mellitus   . Essential hypertension   . Hypothyroidism   . Anemia   . GERD (gastroesophageal reflux disease)     PUD with UGI bleed in 8/12 dx by EGD  . Hyperlipemia   . Bowel obstruction 1997  . Osteoarthritis   . Chronic kidney disease     questionable  . Gastric ulcer   . CAD (coronary artery disease)     A. 11/2010 - Cath: 60% LAD, 50% RCA ;  B. s/p Promus DES to LAD (by FFR 12/12);  RCA ok by FFR 12/12  . Shortness of breath     Past Surgical History  Procedure Date  . Colon resection 1990's  . Hand surgery     cyst on left hand  . Nissen fundoplication     questionable - pt unsure of surgical name  . Gastrostomy w/ feeding tube 1990's  . Colon surgery     COLONECTOMY WEARS A BAG  . Abdominal hysterectomy 1970's  . Dilation and curettage of uterus     "before  hysterectomy"  . Cardiac catheterization 11/2010    moderate 2 vessel CAD. LAD 60%, RCA 50%. Diffuse obstructive disease in distal LCX.   Marland Kitchen Coronary angioplasty with stent placement 04/28/11    Mid LAD: Promus DES (FFR: 0.79). No significant pulmonary hypertension with normal filling pressures.     ROS:  As stated in the HPI and negative for all other systems.  PHYSICAL EXAM BP 123/70  Pulse 66  Resp 18  Ht 5\' 7"  (1.702 m)  Wt 261 lb (118.389 kg)  BMI 40.88 kg/m2 GENERAL:  Well appearing HEENT:  Pupils equal round and reactive, fundi not visualized, oral mucosa unremarkable NECK:  No jugular venous distention, waveform within normal limits, carotid upstroke brisk and symmetric, no bruits, no thyromegaly LYMPHATICS:  No cervical, inguinal adenopathy LUNGS:  Clear to auscultation bilaterally BACK:  No CVA tenderness CHEST:  Unremarkable HEART:  PMI not displaced or sustained,S1 and S2 within normal limits, no S3, no S4, no clicks, no rubs, no murmurs ABD:  Flat, positive bowel sounds normal in frequency in pitch, no bruits, no rebound, no guarding, no midline pulsatile mass, no hepatomegaly, no splenomegaly, colostomy bag EXT:  2 plus pulses throughout, mild bilateral ankle edema, no cyanosis no clubbing SKIN:  No rashes no nodules NEURO:  Cranial nerves II through XII grossly intact, motor grossly intact throughout PSYCH:  Cognitively intact, oriented to person place and time  EKG:  Sinus rhythm, rate 65, axis within normal limits, intervals within normal limits, no acute ST-T wave changes.   ASSESSMENT AND PLAN

## 2011-10-03 NOTE — Assessment & Plan Note (Signed)
The blood pressure is at target. No change in medications is indicated. We will continue with therapeutic lifestyle changes (TLC).  

## 2011-10-03 NOTE — Assessment & Plan Note (Signed)
Her LDL was 93 with an HDL of 69. This is a reasonable level and ratio and she will continue on the meds as listed.

## 2011-10-03 NOTE — Assessment & Plan Note (Signed)
The patient has no new sypmtoms.  No further cardiovascular testing is indicated.  We will continue with aggressive risk reduction and meds as listed.  

## 2011-10-03 NOTE — Patient Instructions (Addendum)
Your physician recommends that you schedule a follow-up appointment in: 3 months. You will receive an appointment at the check out desk.  Your physician has recommended you make the following change in your medication: stop lisinopril and start losartan 50 mg daily. Your new prescriptions have been sent to your pharmacy.   Please start wearing compression stockings. The prescription has been sent to your pharmacy.

## 2011-10-03 NOTE — Progress Notes (Signed)
Lab results reviewed by Dr. Antoine Poche and patient informed that labs are okay per MD.

## 2011-10-03 NOTE — Assessment & Plan Note (Signed)
The etiology of this is not clear.  I will try to switch to an ARB from her ACE inhibitor to see if this helps at the suggestion of pulmonary. However, I suspect this is multifactorial and some degree related to deconditioning and obesity.

## 2011-10-03 NOTE — Assessment & Plan Note (Signed)
I will order compression stockings.  We discussed salt and fluid restriction.

## 2011-12-19 ENCOUNTER — Encounter: Payer: Self-pay | Admitting: Pulmonary Disease

## 2011-12-19 ENCOUNTER — Ambulatory Visit (INDEPENDENT_AMBULATORY_CARE_PROVIDER_SITE_OTHER): Payer: Medicare PPO | Admitting: Pulmonary Disease

## 2011-12-19 VITALS — BP 130/70 | HR 88 | Temp 97.7°F | Ht 66.0 in | Wt 267.2 lb

## 2011-12-19 DIAGNOSIS — R0600 Dyspnea, unspecified: Secondary | ICD-10-CM

## 2011-12-19 DIAGNOSIS — R0683 Snoring: Secondary | ICD-10-CM

## 2011-12-19 DIAGNOSIS — G4731 Primary central sleep apnea: Secondary | ICD-10-CM | POA: Insufficient documentation

## 2011-12-19 DIAGNOSIS — R0989 Other specified symptoms and signs involving the circulatory and respiratory systems: Secondary | ICD-10-CM

## 2011-12-19 MED ORDER — ALBUTEROL SULFATE HFA 108 (90 BASE) MCG/ACT IN AERS: 2.0000 | INHALATION_SPRAY | Freq: Four times a day (QID) | RESPIRATORY_TRACT | Status: DC | PRN

## 2011-12-19 NOTE — Assessment & Plan Note (Signed)
She reports snoring, apnea, and daytime sleepiness with sleep disruption.  Much of her breathing problems occur at night, and may be related to sleep apnea.  She does have cardiac disease.  I have explained how sleep apnea can affect the patient's health.  Driving precautions and importance of weight loss were discussed.  Treatment options for sleep apnea were reviewed.  To further assess will arrange for in lab sleep study.

## 2011-12-19 NOTE — Assessment & Plan Note (Signed)
It is not clear what is triggering this.    She may have a component of asthma.  Advised her that advair is not a rescue medicine, and likely not providing benefit from the way she is using it.  Will have her stop advair.  She can continue proair two puffs as needed for now.  She has cardiac disease, and had recent evaluation with cardiology.  She was felt to be stable with her cardiac status.  She is obese, and deconditioning is likely a contributor to her dyspnea.

## 2011-12-19 NOTE — Patient Instructions (Signed)
Will schedule sleep test and call with results Stop advair Continue proair two puffs up to four times per day as needed for cough, wheeze, or chest congestion Follow up in 3 months

## 2011-12-19 NOTE — Progress Notes (Signed)
Chief Complaint  Patient presents with  . Follow-up    She still feels SOB sometimes even at rest, dry cough. denies any wheezing, chest tx    History of Present Illness: Jenna Diaz is a 66 y.o. female with dyspnea.  She still has trouble with her breathing.  This seems to happen more at night or when she is sitting down.    When she lays flat on her back she feels like she gets smothered.  She snores, and wakes up feeling like she can't breath.  She has to sit up, and then can catch her breath.  She also gets episodes of chest congestion and cough during the day.  She uses her albuterol 3 or 4 times per week, and this helps.  She will sometimes use her advair instead if her albuterol is not handy.  It takes about 5  To 10 minutes before she gets relief after using her inhaler.  She was seen by cardiology in June.  She was changed to ARB from ACE, but is not sure this helped much.   Past Medical History  Diagnosis Date  . Diabetes mellitus   . Essential hypertension   . Hypothyroidism   . Anemia   . GERD (gastroesophageal reflux disease)     PUD with UGI bleed in 8/12 dx by EGD  . Hyperlipemia   . Bowel obstruction 1997  . Osteoarthritis   . Chronic kidney disease     questionable  . Gastric ulcer   . CAD (coronary artery disease)     A. 11/2010 - Cath: 60% LAD, 50% RCA ;  B. s/p Promus DES to LAD (by FFR 12/12);  RCA ok by FFR 12/12  . Shortness of breath     Past Surgical History  Procedure Date  . Colon resection 1990's  . Hand surgery     cyst on left hand  . Nissen fundoplication     questionable - pt unsure of surgical name  . Gastrostomy w/ feeding tube 1990's  . Colon surgery     COLONECTOMY WEARS A BAG  . Abdominal hysterectomy 1970's  . Dilation and curettage of uterus     "before hysterectomy"  . Cardiac catheterization 11/2010    moderate 2 vessel CAD. LAD 60%, RCA 50%. Diffuse obstructive disease in distal LCX.   Marland Kitchen Coronary angioplasty with  stent placement 04/28/11    Mid LAD: Promus DES (FFR: 0.79). No significant pulmonary hypertension with normal filling pressures.     Blood pressure 130/70, pulse 88, temperature 97.7 F (36.5 C), temperature source Oral, height 5\' 6"  (1.676 m), weight 267 lb 3.2 oz (121.201 kg), SpO2 98.00%.  Body mass index is 43.13 kg/(m^2). Wt Readings from Last 3 Encounters:  12/19/11 267 lb 3.2 oz (121.201 kg)  10/03/11 261 lb (118.389 kg)  06/27/11 264 lb (119.75 kg)     Physical Exam:  General - Obese HEENT - PERRLA, EOMI, no sinus tenderness, no oral exudate, no LAN, no thyromegaly, MP 3, 2+ tonsils Cardiac - s1s2 regular, no murmur Chest - no wheeze/rales/dullness Abdomen - soft, colostomy RLQ, no organomegaly Extremities - 1+ edema, no cyanosis/clubbing Neurologic - normal strength, CN intact Skin - no rashes Psychiatric - normal mood, behavior  CT chest 03/16/11>>RML GGO, LLL GGO, ascending thoracic aorta 4.4 cm, mild CM  PFT 01/26/11>>FEV1 2.35(101%), FEV1% 86, TLC 2.41(111%), DLCO 33%, no BD  Dg Chest 2 View  06/13/2011  *RADIOLOGY REPORT*   Clinical Data: Chest pain and  shortness of breath.  CHEST - 2 VIEW  Comparison: None.  Findings: Lungs are clear.  Heart size is normal.  No pneumothorax or effusion.   IMPRESSION: No acute disease.   Original Report Authenticated By: Bernadene Bell. Maricela Curet, M.D.    Assessment/Plan:  Outpatient Encounter Prescriptions as of 12/19/2011  Medication Sig Dispense Refill  . albuterol (PROAIR HFA) 108 (90 BASE) MCG/ACT inhaler Inhale 2 puffs into the lungs every 6 (six) hours as needed for wheezing.  1 Inhaler  5  . amLODipine (NORVASC) 5 MG tablet Take 1 tablet (5 mg total) by mouth daily.  30 tablet  6  . aspirin EC 81 MG tablet Take 81 mg by mouth daily.        . citalopram (CELEXA) 20 MG tablet As needed      . Elastic Bandages & Supports (MEDICAL COMPRESSION STOCKINGS) MISC 1 each by Does not apply route as directed. Bilateral compression  hose [id number]  1 each  0  . Ferrous Sulfate Dried (FEOSOL) 200 (65 FE) MG TABS Take 2 tablets by mouth every morning.      . fluticasone-salmeterol (ADVAIR HFA) 115-21 MCG/ACT inhaler Inhale 2 puffs into the lungs 2 (two) times daily.        . furosemide (LASIX) 40 MG tablet Take 20 mg by mouth daily as needed. For fluid retention       . gabapentin (NEURONTIN) 100 MG capsule Take 100 mg by mouth 3 (three) times daily.        Marland Kitchen glipiZIDE (GLUCOTROL XL) 2.5 MG 24 hr tablet Take 2.5 mg by mouth daily.      . insulin aspart (NOVOLOG FLEXPEN) 100 UNIT/ML injection Inject 12 Units into the skin 2 (two) times daily.        . insulin glargine (LANTUS) 100 UNIT/ML injection Inject into the skin at bedtime. Take 45 units every morning and 40 units every evening       . levothyroxine (SYNTHROID, LEVOTHROID) 150 MCG tablet Take 150 mcg by mouth daily.        Marland Kitchen losartan (COZAAR) 50 MG tablet Take 1 tablet (50 mg total) by mouth daily.  30 tablet  6  . nitroGLYCERIN (NITROSTAT) 0.4 MG SL tablet Place 1 tablet (0.4 mg total) under the tongue every 5 (five) minutes as needed for chest pain.  25 tablet  3  . pantoprazole (PROTONIX) 40 MG tablet Take 40 mg by mouth 2 (two) times daily.       . potassium chloride (K-DUR,KLOR-CON) 10 MEQ tablet Once a day      . potassium chloride (KLOR-CON) 10 MEQ CR tablet Take 20 mEq by mouth daily.       . pravastatin (PRAVACHOL) 80 MG tablet Take 1 tablet (80 mg total) by mouth every evening.  30 tablet  6  . Ticagrelor (BRILINTA) 90 MG TABS tablet Take 1 tablet (90 mg total) by mouth 2 (two) times daily.  60 tablet  11  . traMADol (ULTRAM) 50 MG tablet Take 50 mg by mouth 3 (three) times daily.          Wadie Liew Pager:  [phone] 12/19/2011, 9:04 AM

## 2012-01-08 ENCOUNTER — Telehealth: Payer: Self-pay | Admitting: Pulmonary Disease

## 2012-01-08 ENCOUNTER — Encounter: Payer: Self-pay | Admitting: Cardiology

## 2012-01-08 ENCOUNTER — Ambulatory Visit (HOSPITAL_BASED_OUTPATIENT_CLINIC_OR_DEPARTMENT_OTHER): Payer: Medicare PPO | Attending: Pulmonary Disease | Admitting: Radiology

## 2012-01-08 ENCOUNTER — Ambulatory Visit (INDEPENDENT_AMBULATORY_CARE_PROVIDER_SITE_OTHER): Payer: Medicare PPO | Admitting: Cardiology

## 2012-01-08 VITALS — BP 112/65 | HR 52 | Ht 66.0 in | Wt 267.9 lb

## 2012-01-08 VITALS — Ht 67.0 in | Wt 267.0 lb

## 2012-01-08 DIAGNOSIS — G4730 Sleep apnea, unspecified: Secondary | ICD-10-CM | POA: Insufficient documentation

## 2012-01-08 DIAGNOSIS — G4733 Obstructive sleep apnea (adult) (pediatric): Secondary | ICD-10-CM

## 2012-01-08 DIAGNOSIS — I200 Unstable angina: Secondary | ICD-10-CM

## 2012-01-08 DIAGNOSIS — R0683 Snoring: Secondary | ICD-10-CM

## 2012-01-08 DIAGNOSIS — I2510 Atherosclerotic heart disease of native coronary artery without angina pectoris: Secondary | ICD-10-CM

## 2012-01-08 DIAGNOSIS — I10 Essential (primary) hypertension: Secondary | ICD-10-CM

## 2012-01-08 NOTE — Telephone Encounter (Signed)
I spoke with Tiffany and she wanted to know if pt sleep study could be scheduled closer to home. She stated pt sleep study was for tonight over at [facility]. She wanted to know how much they would be charged if they cancelled the study tonight. I advised her wasn't sure she would have to call over to [facility] to see. She was not sure if she was going to cancel this or not. She will call back if she needed anything further from [place]

## 2012-01-08 NOTE — Progress Notes (Signed)
HPI This is a 66 year old female who is here today for a followup of CAD. She has had a recent history of dypsnea.  Right and left cardiac catheterization demonstrated normal filling pressures and no evidence of pulmonary hypertension. There was a 60-70% mid LAD stenosis which was significant by FFR. The stenosis in the proximal RCA was not significant. She underwent an angioplasty and drug-eluting stent placement to the LAD without complications.  Since I last saw her she has continued to have fatigue and dyspnea. She is actually having a sleep study tonight.  She unfortunately doesn't sleep through the night because she has to empty her colostomy. She's had no new chest pressure, neck or arm discomfort. She's had no new palpitations, presyncope or syncope.  No Known Allergies  Current Outpatient Prescriptions  Medication Sig Dispense Refill  . acetaminophen (TYLENOL) 500 MG tablet Take 500 mg by mouth as needed.      Marland Kitchen albuterol (PROAIR HFA) 108 (90 BASE) MCG/ACT inhaler Inhale 2 puffs into the lungs every 6 (six) hours as needed for wheezing.  1 Inhaler  5  . amLODipine (NORVASC) 5 MG tablet Take 1 tablet (5 mg total) by mouth daily.  30 tablet  6  . aspirin EC 81 MG tablet Take 81 mg by mouth daily.        . citalopram (CELEXA) 20 MG tablet Take 20 mg by mouth as needed.       . Ferrous Sulfate Dried (FEOSOL) 200 (65 FE) MG TABS Take 2 tablets by mouth every morning.      . furosemide (LASIX) 40 MG tablet Take 20 mg by mouth daily as needed. For fluid retention       . gabapentin (NEURONTIN) 100 MG capsule Take 100 mg by mouth 3 (three) times daily.        Marland Kitchen glimepiride (AMARYL) 2 MG tablet Take 2 mg by mouth daily before breakfast.      . insulin aspart (NOVOLOG FLEXPEN) 100 UNIT/ML injection Inject 12 Units into the skin 2 (two) times daily.        . insulin glargine (LANTUS) 100 UNIT/ML injection Inject into the skin at bedtime. Take 45 units every morning and 40 units every evening        . levothyroxine (SYNTHROID, LEVOTHROID) 150 MCG tablet Take 150 mcg by mouth daily.        Marland Kitchen losartan (COZAAR) 50 MG tablet Take 1 tablet (50 mg total) by mouth daily.  30 tablet  6  . nitroGLYCERIN (NITROSTAT) 0.4 MG SL tablet Place 1 tablet (0.4 mg total) under the tongue every 5 (five) minutes as needed for chest pain.  25 tablet  3  . pantoprazole (PROTONIX) 40 MG tablet Take 40 mg by mouth 2 (two) times daily.       . potassium chloride (KLOR-CON) 10 MEQ CR tablet Take 10 mEq by mouth daily.       . pravastatin (PRAVACHOL) 80 MG tablet Take 1 tablet (80 mg total) by mouth every evening.  30 tablet  6  . Ticagrelor (BRILINTA) 90 MG TABS tablet Take 1 tablet (90 mg total) by mouth 2 (two) times daily.  60 tablet  11  . traMADol (ULTRAM) 50 MG tablet Take 50 mg by mouth 3 (three) times daily.          Past Medical History  Diagnosis Date  . Diabetes mellitus   . Essential hypertension   . Hypothyroidism   . Anemia   .  GERD (gastroesophageal reflux disease)     PUD with UGI bleed in 8/12 dx by EGD  . Hyperlipemia   . Bowel obstruction 1997  . Osteoarthritis   . Chronic kidney disease     questionable  . Gastric ulcer   . CAD (coronary artery disease)     A. 11/2010 - Cath: 60% LAD, 50% RCA ;  B. s/p Promus DES to LAD (by FFR 12/12);  RCA ok by FFR 12/12  . Shortness of breath     Past Surgical History  Procedure Date  . Colon resection 1990's  . Hand surgery     cyst on left hand  . Nissen fundoplication     questionable - pt unsure of surgical name  . Gastrostomy w/ feeding tube 1990's  . Colon surgery     COLONECTOMY WEARS A BAG  . Abdominal hysterectomy 1970's  . Dilation and curettage of uterus     "before hysterectomy"  . Cardiac catheterization 11/2010    moderate 2 vessel CAD. LAD 60%, RCA 50%. Diffuse obstructive disease in distal LCX.   Marland Kitchen Coronary angioplasty with stent placement 04/28/11    Mid LAD: Promus DES (FFR: 0.79). No significant pulmonary  hypertension with normal filling pressures.     ROS:  As stated in the HPI and negative for all other systems.  PHYSICAL EXAM BP 112/65  Pulse 52  Ht 5\' 6"  (1.676 m)  Wt 267 lb 14.4 oz (121.519 kg)  BMI 43.24 kg/m2  SpO2 100% GENERAL:  Well appearing NECK:  No jugular venous distention, waveform within normal limits, carotid upstroke brisk and symmetric, no bruits, no thyromegaly LUNGS:  Clear to auscultation bilaterally BACK:  No CVA tenderness CHEST:  Unremarkable HEART:  PMI not displaced or sustained,S1 and S2 within normal limits, no S3, no S4, no clicks, no rubs, no murmurs ABD:  Flat, positive bowel sounds normal in frequency in pitch, no bruits, no rebound, no guarding, no midline pulsatile mass, no hepatomegaly, no splenomegaly, colostomy bag EXT:  2 plus pulses throughout, mild bilateral ankle edema, no cyanosis no clubbing SKIN:  Bruising on her back.   ASSESSMENT AND PLAN  Dyspnea -  I suspect that this is multifactorial. No further cardiac workup is planned.  CAD (coronary artery disease) -  The patient has no new sypmtoms. No further cardiovascular testing is indicated. We will continue with aggressive risk reduction and meds as listed.   Hypertension -  The blood pressure is at target. No change in medications is indicated. We will continue with therapeutic lifestyle changes (TLC).   Edema -  She got compression stockings after the last visit and he seemed to have helped. She will continue with salt and fluid restriction.  Dyslipidemia -  Her LDL was 93 with an HDL of 69 with triglycerides of 162 in June. This is a reasonable level and ratio and she will continue on the meds as listed.

## 2012-01-08 NOTE — Telephone Encounter (Signed)
Pt's dauighter calling to see if the sleep study can be done at [facility] instead of [facility]. I told her we can get the PCC's to see when they can do the sleep study at [facility] and call her back. The daughter then changed her mind and said she would take the pt for the sleep study tonight at [facility] and nothing further is needed.

## 2012-01-08 NOTE — Patient Instructions (Addendum)

## 2012-01-17 DIAGNOSIS — G4733 Obstructive sleep apnea (adult) (pediatric): Secondary | ICD-10-CM

## 2012-01-17 NOTE — Procedures (Signed)
[name], Jenna Diaz NO.:  [id number]  MEDICAL RECORD NO.:  [id number]          PATIENT TYPE:  OUT  LOCATION:  SLEEP CENTER                 FACILITY:  [facility]  PHYSICIAN:  Coralyn Helling, MD        DATE OF BIRTH:  November 18, 1945  DATE OF STUDY:  01/08/2012                           NOCTURNAL POLYSOMNOGRAM  REFERRING PHYSICIAN:  Coralyn Helling, MD  INDICATION FOR STUDY:  [name] is a 66 year old female who has a history of diabetes, hypertension, and coronary artery disease.  She also has sleep disruption, snoring, and daytime sleepiness.  She is referred to the sleep lab for evaluation of hypersomnia with obstructive sleep apnea.  Height is 5 feet 7 inches, weight is 257 pounds, BMI is 42, neck size is 17 inches.  EPWORTH SLEEPINESS SCORE:  14.  MEDICATIONS:  Aspirin, amlodipine, levothyroxine, pantoprazole, gabapentin, glimepiride, Brilinta, losartan, Lantus, NovoLog, tramadol, pravastatin, iron, Lasix, and citalopram.  SLEEP ARCHITECTURE:  Total recording time was 380 minutes, total sleep time was 310 minutes.  Sleep efficiency was 81%.  Sleep latency was 15 minutes.  REM latency was 107 minutes.  The patient was observed in all stages of sleep and she slept predominantly in the nonsupine position.  RESPIRATORY DATA:  The average respiratory rate was 20.  Loud snoring was noted by the technician.  The overall apnea-hypopnea index was 32.5. There were 76 central apneic events, 18 mixed respiratory events, and the remainder of the events were obstructive in nature.  The REM apnea- hypopnea index was 16, the non-REM apnea-hypopnea index was 27.2.  OXYGEN DATA:  The baseline oxygenation was 98%.  The oxygen saturation nadir was 86%.  The patient had a total of 13.7 minutes with an oxygen saturation below 88%.  CARDIAC DATA:  The average heart rate is 55 and the rhythm strip showed sinus rhythm.  MOVEMENT-PARASOMNIA:  The periodic limb movement index was 0 and  the patient had 2 restroom trips.  IMPRESSIONS-RECOMMENDATIONS:  This study shows evidence for severe complex sleep apnea with an apnea-hypopnea index of 32.5 and oxygen saturation nadir of 86%.  She did have both obstructive and central respiratory events.  In addition to diet, exercise, and weight reduction, I would recommend that the patient return to the sleep lab for a CPAP titration study.     Coralyn Helling, MD Diplomat, American Board of Sleep Medicine    VS/MEDQ  D:  01/17/2012 07:44:21  T:  01/17/2012 23:46:00  Job:  [id number]

## 2012-01-18 ENCOUNTER — Telehealth: Payer: Self-pay | Admitting: Pulmonary Disease

## 2012-01-18 DIAGNOSIS — R0683 Snoring: Secondary | ICD-10-CM

## 2012-01-18 NOTE — Telephone Encounter (Signed)
PSG 01/08/12>>AHI 32.5, SpO2 86%.  Complex sleep apnea.  Will have my nurse schedule ROV to review results.

## 2012-01-19 NOTE — Telephone Encounter (Signed)
i spoke with pt and is scheduled to come in 01/30/12 at 9:30

## 2012-01-30 ENCOUNTER — Ambulatory Visit (INDEPENDENT_AMBULATORY_CARE_PROVIDER_SITE_OTHER): Payer: Medicare PPO | Admitting: Pulmonary Disease

## 2012-01-30 ENCOUNTER — Encounter: Payer: Self-pay | Admitting: Pulmonary Disease

## 2012-01-30 VITALS — BP 144/62 | HR 52 | Temp 98.1°F | Ht 67.0 in | Wt 264.6 lb

## 2012-01-30 DIAGNOSIS — G4731 Primary central sleep apnea: Secondary | ICD-10-CM

## 2012-01-30 DIAGNOSIS — G4730 Sleep apnea, unspecified: Secondary | ICD-10-CM

## 2012-01-30 DIAGNOSIS — R0609 Other forms of dyspnea: Secondary | ICD-10-CM

## 2012-01-30 DIAGNOSIS — R0600 Dyspnea, unspecified: Secondary | ICD-10-CM

## 2012-01-30 NOTE — Progress Notes (Signed)
Chief Complaint  Patient presents with  . Follow-up    discuss sleep study    History of Present Illness: Jenna Diaz is a 66 y.o. female never smoker with non-specific dyspnea, and complex sleep apnea.  She is here to review her sleep study from 01/08/12.   Tests: CT chest 03/16/11>>RML GGO, LLL GGO, ascending thoracic aorta 4.4 cm, mild CM  PFT 01/26/11>>FEV1 2.35(101%), FEV1% 86, TLC 2.41(111%), DLCO 33%, no BD CXR 06/13/11>>no acute disease PSG 01/08/12>>AHI 32.5, SpO2 86%. Complex sleep apnea.    Past Medical History  Diagnosis Date  . Diabetes mellitus   . Essential hypertension   . Hypothyroidism   . Anemia   . GERD (gastroesophageal reflux disease)     PUD with UGI bleed in 8/12 dx by EGD  . Hyperlipemia   . Bowel obstruction 1997  . Osteoarthritis   . Chronic kidney disease     questionable  . Gastric ulcer   . CAD (coronary artery disease)     A. 11/2010 - Cath: 60% LAD, 50% RCA ;  B. s/p Promus DES to LAD (by FFR 12/12);  RCA ok by FFR 12/12  . Shortness of breath     Past Surgical History  Procedure Date  . Colon resection 1990's  . Hand surgery     cyst on left hand  . Nissen fundoplication     questionable - pt unsure of surgical name  . Gastrostomy w/ feeding tube 1990's  . Colon surgery     COLONECTOMY WEARS A BAG  . Abdominal hysterectomy 1970's  . Dilation and curettage of uterus     "before hysterectomy"  . Cardiac catheterization 11/2010    moderate 2 vessel CAD. LAD 60%, RCA 50%. Diffuse obstructive disease in distal LCX.   Marland Kitchen Coronary angioplasty with stent placement 04/28/11    Mid LAD: Promus DES (FFR: 0.79). No significant pulmonary hypertension with normal filling pressures.     Blood pressure 144/62, pulse 52, temperature 98.1 F (36.7 C), temperature source Oral, height 5\' 7"  (1.702 m), weight 264 lb 9.6 oz (120.022 kg), SpO2 99.00%.  Body mass index is 41.44 kg/(m^2). Wt Readings from Last 3 Encounters:  01/30/12 264 lb  9.6 oz (120.022 kg)  01/08/12 267 lb (121.11 kg)  01/08/12 267 lb 14.4 oz (121.519 kg)    Physical Exam:  General - Obese HEENT - PERRLA, EOMI, no sinus tenderness, no oral exudate, no LAN, no thyromegaly, MP 3, 2+ tonsils Cardiac - s1s2 regular, no murmur Chest - no wheeze/rales/dullness Abdomen - soft, colostomy RLQ, no organomegaly Extremities - 1+ edema, no cyanosis/clubbing Neurologic - normal strength, CN intact Skin - no rashes Psychiatric - normal mood, behavior   Assessment/Plan:  Outpatient Encounter Prescriptions as of 01/30/2012  Medication Sig Dispense Refill  . acetaminophen (TYLENOL) 500 MG tablet Take 500 mg by mouth as needed.      Marland Kitchen albuterol (PROAIR HFA) 108 (90 BASE) MCG/ACT inhaler Inhale 2 puffs into the lungs every 6 (six) hours as needed for wheezing.  1 Inhaler  5  . amLODipine (NORVASC) 5 MG tablet Take 1 tablet (5 mg total) by mouth daily.  30 tablet  6  . aspirin EC 81 MG tablet Take 81 mg by mouth daily.        . citalopram (CELEXA) 20 MG tablet Take 20 mg by mouth as needed.       . Ferrous Sulfate Dried (FEOSOL) 200 (65 FE) MG TABS Take 2 tablets by mouth  every morning.      . furosemide (LASIX) 40 MG tablet Take 20 mg by mouth daily as needed. For fluid retention       . gabapentin (NEURONTIN) 300 MG capsule Take 300 mg by mouth 2 (two) times daily.      Marland Kitchen glimepiride (AMARYL) 2 MG tablet Take 2 mg by mouth daily before breakfast.      . insulin aspart (NOVOLOG FLEXPEN) 100 UNIT/ML injection Inject 12 Units into the skin 2 (two) times daily.        . insulin glargine (LANTUS) 100 UNIT/ML injection Inject into the skin at bedtime. Take 45 units every morning and 40 units every evening       . levothyroxine (SYNTHROID, LEVOTHROID) 150 MCG tablet Take 150 mcg by mouth daily.        Marland Kitchen losartan (COZAAR) 50 MG tablet Take 1 tablet (50 mg total) by mouth daily.  30 tablet  6  . nitroGLYCERIN (NITROSTAT) 0.4 MG SL tablet Place 1 tablet (0.4 mg total) under  the tongue every 5 (five) minutes as needed for chest pain.  25 tablet  3  . pantoprazole (PROTONIX) 40 MG tablet Take 40 mg by mouth 2 (two) times daily.       . potassium chloride (KLOR-CON) 10 MEQ CR tablet Take 10 mEq by mouth daily.       . pravastatin (PRAVACHOL) 80 MG tablet Take 1 tablet (80 mg total) by mouth every evening.  30 tablet  6  . Ticagrelor (BRILINTA) 90 MG TABS tablet Take 1 tablet (90 mg total) by mouth 2 (two) times daily.  60 tablet  11  . traMADol (ULTRAM) 50 MG tablet Take 50 mg by mouth 3 (three) times daily.        Marland Kitchen DISCONTD: gabapentin (NEURONTIN) 100 MG capsule Take 100 mg by mouth 3 (three) times daily.          Jenna Diaz Pager:  [phone] 01/30/2012, 10:27 AM

## 2012-01-30 NOTE — Assessment & Plan Note (Signed)
Most likely related to obesity and deconditioning.  She also has CAD and probable diastolic dysfx>>she is followed by cardiology.

## 2012-01-30 NOTE — Patient Instructions (Addendum)
Will arrange for CPAP titration study  Will call to schedule follow up after sleep study reviewed 

## 2012-01-30 NOTE — Assessment & Plan Note (Signed)
She has severe complex sleep apnea.  I have reviewed the sleep test results with the patient.  Explained how sleep apnea can affect the patient's health.  Driving precautions and importance of weight loss were discussed.  Treatment options for sleep apnea were reviewed.  Will arrange for in lab CPAP titration.

## 2012-03-01 ENCOUNTER — Ambulatory Visit (HOSPITAL_BASED_OUTPATIENT_CLINIC_OR_DEPARTMENT_OTHER): Payer: Medicare PPO | Attending: Pulmonary Disease | Admitting: Sleep Medicine

## 2012-03-01 VITALS — Ht 67.0 in | Wt 264.0 lb

## 2012-03-01 DIAGNOSIS — G4739 Other sleep apnea: Secondary | ICD-10-CM

## 2012-03-01 DIAGNOSIS — G4730 Sleep apnea, unspecified: Secondary | ICD-10-CM | POA: Insufficient documentation

## 2012-03-01 DIAGNOSIS — G4731 Primary central sleep apnea: Secondary | ICD-10-CM

## 2012-03-02 ENCOUNTER — Encounter (HOSPITAL_BASED_OUTPATIENT_CLINIC_OR_DEPARTMENT_OTHER): Payer: Self-pay | Admitting: Sleep Medicine

## 2012-03-13 DIAGNOSIS — Z01810 Encounter for preprocedural cardiovascular examination: Secondary | ICD-10-CM

## 2012-03-14 DIAGNOSIS — G4730 Sleep apnea, unspecified: Secondary | ICD-10-CM

## 2012-03-14 NOTE — Procedures (Signed)
Jenna Diaz, WASSER NO.:  [id number]  MEDICAL RECORD NO.:  [id number]          PATIENT TYPE:  OUT  LOCATION:  SLEEP CENTER                 FACILITY:  [facility]  PHYSICIAN:  Coralyn Helling, MD        DATE OF BIRTH:  [birth year]  DATE OF STUDY:  03/01/2012                           NOCTURNAL POLYSOMNOGRAM  REFERRING PHYSICIAN:  Coralyn Helling, MD  [facility]AC[facility].  REFERRING PHYSICIAN:  Coralyn Helling, MD  INDICATION:  [name] is a 66 year old female who was found to have severe complex sleep apnea during sleep study from January 08, 2012 with a apnea/hypopnea index of 32.5 and oxygen saturation nadir of 86%. She returned to the sleep lab for a CPAP titration study.  Height is 5 feet 7 inches, weight is 216 pounds.  BMI is 42, neck size 17 inches.  MEDICATIONS:  Gabapentin, NovoLog, Lantus, pravastatin, Brilinta, tramadol, Tylenol, albuterol, Norvasc, aspirin, Feosol, Amaryl, Synthroid, Protonix, and Klor-Con.  EPWORTH SLEEPINESS SCORE:  10.  SLEEP ARCHITECTURE:  Total recording time is 380.5 minutes.  Total sleep time was 216.5 minutes.  Sleep efficiency was 60%.  Sleep latency was 6 minutes.  REM latency is 128 minutes.  The patient was observed in all stages of sleep and she slept in both the supine and non-supine positions.  RESPIRATORY DATA:  The average respiratory rate was 13.  The patient was started on CPAP of 5 and increased to 16 cm of water.  It appeared that she had optimal control of her sleep-disordered breathing with CPAP at 12 cm water.  At this pressure setting, she was observed in REM sleep and supine sleep, and her apnea/hypopnea index was reduced to 3.9.  OXYGEN DATA:  The baseline oxygenation was 98%.  The oxygen saturation nadir was 90%.  The study was conducted without the use of supplemental oxygen.  CARDIAC DATA:  The average heart rate was 51 and the rhythm strip showed sinus rhythm with occasional  PVCs.  MOVEMENT/PARASOMNIA:  The periodic limb movement index was 0 and the patient had 1 restroom trip.  IMPRESSION:  This was a successful CPAP titration study.  The patient did well with CPAP set at 12 cm of water.  At this pressure setting, she was observed in REM sleep, supine sleep, and snoring was eliminated.  She was fitted with a Resmed Mirage Quattro medium size full face mask.  In addition to diet, exercise, and weight reduction, I would recommend that the patient be started on CPAP at 12 cm water and monitored for her clinical response.     Coralyn Helling, MD Diplomat, American Board of Sleep Medicine    VS/MEDQ  D:  03/14/2012 08:25:54  T:  03/14/2012 21:33:27  Job:  [id number]

## 2012-03-20 ENCOUNTER — Telehealth: Payer: Self-pay | Admitting: Pulmonary Disease

## 2012-03-20 DIAGNOSIS — G4731 Primary central sleep apnea: Secondary | ICD-10-CM

## 2012-03-20 NOTE — Telephone Encounter (Signed)
[id number] x1 for pt 

## 2012-03-20 NOTE — Telephone Encounter (Signed)
CPAP 03/01/12>>CPAP 12 cm H2O>>AHI 3.9, +R, +S.  Left message detailing plan to start CPAP 12 cm H2O.    Will have my nurse verify plan with pt, and schedule ROV 2 months after CPAP set up.

## 2012-03-21 ENCOUNTER — Ambulatory Visit: Payer: Medicare PPO | Admitting: Pulmonary Disease

## 2012-03-21 NOTE — Telephone Encounter (Signed)
Pt returned call.  Call her back @ [phone] Leanora Ivanoff

## 2012-03-21 NOTE — Telephone Encounter (Signed)
I spoke with pt and did receive VS message. She stated she will call once she is set up on cpap to schedule OV. appt for today has been cancelled

## 2012-05-03 ENCOUNTER — Telehealth: Payer: Self-pay | Admitting: Cardiology

## 2012-05-03 MED ORDER — TICAGRELOR 90 MG PO TABS: 90.0000 mg | ORAL_TABLET | Freq: Two times a day (BID) | ORAL | Status: DC

## 2012-05-03 MED ORDER — LOSARTAN POTASSIUM 50 MG PO TABS: 50.0000 mg | ORAL_TABLET | Freq: Every day | ORAL | Status: DC

## 2012-05-03 MED ORDER — AMLODIPINE BESYLATE 5 MG PO TABS: 5.0000 mg | ORAL_TABLET | Freq: Every day | ORAL | Status: DC

## 2012-05-03 NOTE — Telephone Encounter (Signed)
Spoke with patient and informed her that she should never stop a medication unless a doctor tells her to stop it. Patient verbalized understanding of plan.

## 2012-05-03 NOTE — Telephone Encounter (Signed)
BRILINTA   - patient would like to know if she is to continue taking this medication. It is time for her to have a refill

## 2012-05-13 NOTE — Telephone Encounter (Signed)
Move up her appt with me so that it occurs before she has to have her Brillinta refilled again. I will likely stop it. Thanks. ----- Message ----- From: Eustace Moore, LPN Sent: 05/06/1094 1:22 PM To: Rollene Rotunda, MD   05/13/12 @ 8:10 am Left message for patient to call office.

## 2012-05-13 NOTE — Telephone Encounter (Signed)
Patient informed and is willing to see MD at the [facility] office since there is no available appointments at [facility] office. Patient is scheduled @3 :45 pm on January 22,2014. Patient aware of appointment.

## 2012-05-22 ENCOUNTER — Ambulatory Visit (INDEPENDENT_AMBULATORY_CARE_PROVIDER_SITE_OTHER): Payer: Medicare Other | Admitting: Cardiology

## 2012-05-22 ENCOUNTER — Encounter: Payer: Self-pay | Admitting: Cardiology

## 2012-05-22 VITALS — BP 150/80 | HR 63 | Ht 68.0 in | Wt 271.0 lb

## 2012-05-22 DIAGNOSIS — I2510 Atherosclerotic heart disease of native coronary artery without angina pectoris: Secondary | ICD-10-CM

## 2012-05-22 MED ORDER — ASPIRIN EC 81 MG PO TBEC: 162.0000 mg | DELAYED_RELEASE_TABLET | Freq: Every day | ORAL | Status: DC

## 2012-05-22 NOTE — Patient Instructions (Addendum)
Stop Brilenta and start Asa 81 mg two a day. Continue all other medications as listed.  Follow up in 1 year with Dr Antoine Poche.  You will receive a letter in the mail 2 months before you are due.  Please call us when you receive this letter to schedule your follow up appointment.

## 2012-05-22 NOTE — Progress Notes (Signed)
HPI This is a 67 year old female who is here today for a followup of CAD. She has had a recent history of dypsnea.  Right and left cardiac catheterization demonstrated normal filling pressures and no evidence of pulmonary hypertension. There was a 60-70% mid LAD stenosis which was significant by FFR. The stenosis in the proximal RCA was not significant. She underwent an angioplasty and drug-eluting stent placement to the LAD without complications.  Since I last saw her she has had no new cardiovascular complaints.  She is now being treated for sleep apnea. She's using CPAP and she thinks it makes her feel a little but better though she is fatigued. She does not have any of the symptoms she had previously and is denying chest discomfort, neck or arm discomfort. She's not having any new palpitations, presyncope or syncope. She has had no PND or orthopnea.  She did call asking whether she could stop her Brilinta.   No Known Allergies  Current Outpatient Prescriptions  Medication Sig Dispense Refill  . acetaminophen (TYLENOL) 500 MG tablet Take 500 mg by mouth as needed.      Marland Kitchen albuterol (PROAIR HFA) 108 (90 BASE) MCG/ACT inhaler Inhale 2 puffs into the lungs every 6 (six) hours as needed for wheezing.  1 Inhaler  5  . amLODipine (NORVASC) 5 MG tablet Take 1 tablet (5 mg total) by mouth daily.  30 tablet  6  . aspirin EC 81 MG tablet Take 81 mg by mouth daily.        . citalopram (CELEXA) 20 MG tablet Take 20 mg by mouth as needed.       . Ferrous Sulfate Dried (FEOSOL) 200 (65 FE) MG TABS Take 2 tablets by mouth every morning.      . furosemide (LASIX) 40 MG tablet Take 20 mg by mouth daily as needed. For fluid retention       . gabapentin (NEURONTIN) 300 MG capsule Take 300 mg by mouth 2 (two) times daily.      Marland Kitchen glimepiride (AMARYL) 2 MG tablet Take 2 mg by mouth daily before breakfast.      . insulin glargine (LANTUS) 100 UNIT/ML injection Inject into the skin at bedtime. Take 45 units every  morning and 40 units every evening       . insulin lispro (HUMALOG) 100 UNIT/ML injection Inject 12 Units into the skin 2 (two) times daily.      Marland Kitchen levothyroxine (SYNTHROID, LEVOTHROID) 150 MCG tablet Take 150 mcg by mouth daily.        Marland Kitchen losartan (COZAAR) 50 MG tablet Take 1 tablet (50 mg total) by mouth daily.  30 tablet  6  . pantoprazole (PROTONIX) 40 MG tablet Take 40 mg by mouth 2 (two) times daily.       . potassium chloride (KLOR-CON) 10 MEQ CR tablet Take 10 mEq by mouth daily.       . pravastatin (PRAVACHOL) 80 MG tablet Take 1 tablet (80 mg total) by mouth every evening.  30 tablet  6  . Ticagrelor (BRILINTA) 90 MG TABS tablet Take 1 tablet (90 mg total) by mouth 2 (two) times daily.  60 tablet  3  . traMADol (ULTRAM) 50 MG tablet Take 50 mg by mouth 3 (three) times daily.        . nitroGLYCERIN (NITROSTAT) 0.4 MG SL tablet Place 1 tablet (0.4 mg total) under the tongue every 5 (five) minutes as needed for chest pain.  25 tablet  3  Past Medical History  Diagnosis Date  . Diabetes mellitus   . Essential hypertension   . Hypothyroidism   . Anemia   . GERD (gastroesophageal reflux disease)     PUD with UGI bleed in 8/12 dx by EGD  . Hyperlipemia   . Bowel obstruction 1997  . Osteoarthritis   . Chronic kidney disease     questionable  . Gastric ulcer   . CAD (coronary artery disease)     A. 11/2010 - Cath: 60% LAD, 50% RCA ;  B. s/p Promus DES to LAD (by FFR 12/12);  RCA ok by FFR 12/12  . Shortness of breath     Past Surgical History  Procedure Date  . Colon resection 1990's  . Hand surgery     cyst on left hand  . Nissen fundoplication     questionable - pt unsure of surgical name  . Gastrostomy w/ feeding tube 1990's  . Colon surgery     COLONECTOMY WEARS A BAG  . Abdominal hysterectomy 1970's  . Dilation and curettage of uterus     "before hysterectomy"  . Cardiac catheterization 11/2010    moderate 2 vessel CAD. LAD 60%, RCA 50%. Diffuse obstructive disease  in distal LCX.   Marland Kitchen Coronary angioplasty with stent placement 04/28/11    Mid LAD: Promus DES (FFR: 0.79). No significant pulmonary hypertension with normal filling pressures.     ROS:  As stated in the HPI and negative for all other systems.  PHYSICAL EXAM BP 150/80  Pulse 63  Ht 5\' 8"  (1.727 m)  Wt 271 lb (122.925 kg)  BMI 41.21 kg/m2 GENERAL:  Well appearing NECK:  No jugular venous distention, waveform within normal limits, carotid upstroke brisk and symmetric, no bruits, no thyromegaly LUNGS:  Clear to auscultation bilaterally BACK:  No CVA tenderness CHEST:  Unremarkable HEART:  PMI not displaced or sustained,S1 and S2 within normal limits, no S3, no S4, no clicks, no rubs, no murmurs ABD:  Flat, positive bowel sounds normal in frequency in pitch, no bruits, no rebound, no guarding, no midline pulsatile mass, no hepatomegaly, no splenomegaly, colostomy bag EXT:  2 plus pulses throughout, mild bilateral ankle edema, no cyanosis no clubbing SKIN:  Bruising on her back.   ASSESSMENT AND PLAN   CAD (coronary artery disease) -  The patient has no new sypmtoms. No further cardiovascular testing is indicated.  She can stop her Brilinta and I would like her to start taking at least 162 mg of aspirin. I did review the results of her previous catheterization.   Hypertension -  Her blood pressure is elevated today but it is not typically. We discussed weight loss to keep her blood pressure control. I reviewed previous readings. No change in therapy is indicated.   Edema -  She  continues to wear compression stockings. She will continue with salt and fluid restriction.  No change in therapy is indicated.   Dyslipidemia -  I think she's at a reasonable statin level according to current guidelines. She will continue on the meds as listed.   Obesity - I encouraged her to enroll in  [employer] .

## 2012-06-19 ENCOUNTER — Encounter: Payer: Self-pay | Admitting: Pulmonary Disease

## 2012-06-19 ENCOUNTER — Ambulatory Visit (INDEPENDENT_AMBULATORY_CARE_PROVIDER_SITE_OTHER): Payer: Medicare Other | Admitting: Pulmonary Disease

## 2012-06-19 VITALS — BP 134/84 | HR 81 | Temp 97.8°F | Ht 67.0 in | Wt 276.0 lb

## 2012-06-19 DIAGNOSIS — G4731 Primary central sleep apnea: Secondary | ICD-10-CM

## 2012-06-19 NOTE — Patient Instructions (Signed)
Will make pressure changes on CPAP machine Will call with report from CPAP machine Follow up in 6 months

## 2012-06-19 NOTE — Assessment & Plan Note (Signed)
She reports compliance with CPAP and improvement with therapy.  Reviewed proper fit of her mask.  Will change to auto CPAP and get download.

## 2012-06-19 NOTE — Progress Notes (Signed)
Chief Complaint  Patient presents with  . Follow-up    pt wears CPAP everynight x 4-5 hrs a night. Sometimes it does not feel like it is enough pressure. Still feels tired during the day    History of Present Illness: Jenna Diaz is a 67 y.o. female never smoker with complex sleep apnea.  She has been using CPAP.  This helps.  She still gets headaches, but not as often.  She doesn't feel like she gets enough pressure all the time.  She has a full face mask.  She has to tighten her mask to prevent leak.  Tests: CT chest 03/16/11>>RML GGO, LLL GGO, ascending thoracic aorta 4.4 cm, mild CM  PFT 01/26/11>>FEV1 2.35(101%), FEV1% 86, TLC 2.41(111%), DLCO 33%, no BD CXR 06/13/11>>no acute disease PSG 01/08/12>>AHI 32.5, SpO2 86%. Complex sleep apnea. CPAP 03/01/12>>CPAP 12 cm H2O>>AHI 3.9, +R, +S. 06/19/12 Change to auto CPAP 5 to 15 cm H2O  Past Medical History  Diagnosis Date  . Diabetes mellitus   . Essential hypertension   . Hypothyroidism   . Anemia   . GERD (gastroesophageal reflux disease)     PUD with UGI bleed in 8/12 dx by EGD  . Hyperlipemia   . Bowel obstruction 1997  . Osteoarthritis   . Chronic kidney disease     questionable  . Gastric ulcer   . CAD (coronary artery disease)     A. 11/2010 - Cath: 60% LAD, 50% RCA ;  B. s/p Promus DES to LAD (by FFR 12/12);  RCA ok by FFR 12/12  . Shortness of breath     Past Surgical History  Procedure Laterality Date  . Colon resection  1990's  . Hand surgery      cyst on left hand  . Nissen fundoplication      questionable - pt unsure of surgical name  . Gastrostomy w/ feeding tube  1990's  . Colon surgery      COLONECTOMY WEARS A BAG  . Abdominal hysterectomy  1970's  . Dilation and curettage of uterus      "before hysterectomy"   Current Outpatient Prescriptions on File Prior to Visit  Medication Sig Dispense Refill  . acetaminophen (TYLENOL) 500 MG tablet Take 500 mg by mouth as needed.      Marland Kitchen albuterol (PROAIR  HFA) 108 (90 BASE) MCG/ACT inhaler Inhale 2 puffs into the lungs every 6 (six) hours as needed for wheezing.  1 Inhaler  5  . amLODipine (NORVASC) 5 MG tablet Take 1 tablet (5 mg total) by mouth daily.  30 tablet  6  . citalopram (CELEXA) 20 MG tablet Take 20 mg by mouth as needed.       . Ferrous Sulfate Dried (FEOSOL) 200 (65 FE) MG TABS Take 2 tablets by mouth every morning.      . furosemide (LASIX) 40 MG tablet Take 20 mg by mouth daily as needed. For fluid retention       . gabapentin (NEURONTIN) 300 MG capsule Take 300 mg by mouth 2 (two) times daily.      Marland Kitchen glimepiride (AMARYL) 2 MG tablet Take 2 mg by mouth daily before breakfast.      . insulin glargine (LANTUS) 100 UNIT/ML injection Inject into the skin at bedtime. Take 45 units every morning and 40 units every evening       . insulin lispro (HUMALOG) 100 UNIT/ML injection Inject 12 Units into the skin 2 (two) times daily.      Marland Kitchen  levothyroxine (SYNTHROID, LEVOTHROID) 150 MCG tablet Take 150 mcg by mouth daily.        Marland Kitchen losartan (COZAAR) 50 MG tablet Take 1 tablet (50 mg total) by mouth daily.  30 tablet  6  . nitroGLYCERIN (NITROSTAT) 0.4 MG SL tablet Place 1 tablet (0.4 mg total) under the tongue every 5 (five) minutes as needed for chest pain.  25 tablet  3  . pantoprazole (PROTONIX) 40 MG tablet Take 40 mg by mouth 2 (two) times daily.       . potassium chloride (KLOR-CON) 10 MEQ CR tablet Take 10 mEq by mouth daily.       . pravastatin (PRAVACHOL) 80 MG tablet Take 1 tablet (80 mg total) by mouth every evening.  30 tablet  6  . traMADol (ULTRAM) 50 MG tablet Take 50 mg by mouth 3 (three) times daily.         No current facility-administered medications on file prior to visit.   No Known Allergies   Physical Exam:  Filed Vitals:   06/19/12 1443  BP: 134/84  Pulse: 81  Temp: 97.8 F (36.6 C)  TempSrc: Oral  Height: 5\' 7"  (1.702 m)  Weight: 276 lb (125.193 kg)  SpO2: 99%    Body mass index is 43.22 kg/(m^2).   Wt  Readings from Last 3 Encounters:  06/19/12 276 lb (125.193 kg)  05/22/12 271 lb (122.925 kg)  03/02/12 264 lb (119.75 kg)     General - Obese HEENT - no sinus tenderness, no oral exudate, no LAN, MP 3, 2+ tonsils Cardiac - s1s2 regular, no murmur Chest - no wheeze/rales/dullness Abdomen - soft, colostomy RLQ Extremities - no edema, no cyanosis/clubbing Neurologic - normal strength, CN intact Skin - no rashes Psychiatric - normal mood, behavior   Assessment/Plan: Coralyn Helling, MD [facility] Pulmonary/Critical Care 06/19/2012, 2:53 PM Pager:  [phone] After 3pm call: [phone]

## 2012-07-16 ENCOUNTER — Ambulatory Visit: Payer: Medicare PPO | Admitting: Cardiology

## 2012-12-04 ENCOUNTER — Other Ambulatory Visit: Payer: Self-pay | Admitting: Cardiology

## 2013-03-06 ENCOUNTER — Other Ambulatory Visit: Payer: Self-pay

## 2013-06-19 ENCOUNTER — Ambulatory Visit (INDEPENDENT_AMBULATORY_CARE_PROVIDER_SITE_OTHER): Payer: Medicare Other | Admitting: Pulmonary Disease

## 2013-06-19 ENCOUNTER — Encounter: Payer: Self-pay | Admitting: Pulmonary Disease

## 2013-06-19 VITALS — BP 120/82 | HR 60 | Ht 66.0 in | Wt 272.0 lb

## 2013-06-19 DIAGNOSIS — R0609 Other forms of dyspnea: Secondary | ICD-10-CM

## 2013-06-19 DIAGNOSIS — G4730 Sleep apnea, unspecified: Secondary | ICD-10-CM

## 2013-06-19 DIAGNOSIS — G4731 Primary central sleep apnea: Secondary | ICD-10-CM

## 2013-06-19 DIAGNOSIS — R0989 Other specified symptoms and signs involving the circulatory and respiratory systems: Secondary | ICD-10-CM

## 2013-06-19 DIAGNOSIS — R0600 Dyspnea, unspecified: Secondary | ICD-10-CM

## 2013-06-19 NOTE — Assessment & Plan Note (Addendum)
She has persistent dyspnea.  She had previously evaluation for this in 2013, and main concern was for deconditioning, CAD and possible diastolic dysfunction.    She reports that she has cardiology follow up for next month.  Will also arrange for repeat PFT's.  She had chest xray in January 2015 which was unrevealing.

## 2013-06-19 NOTE — Assessment & Plan Note (Signed)
Will arrange for her DME to assess her set up and get her a new mask.  She is to use auto CPAP >> will get copy of her download.

## 2013-06-19 NOTE — Progress Notes (Signed)
Chief Complaint  Patient presents with  . Sleep Apnea    Currently using CPAP machine. "Cap" is broke and will need this fixed. Pt doesn't know who her DME is, states that no one has been monitoring machine.    History of Present Illness: Jenna Diaz is a 68 y.o. female never smoker with complex sleep apnea and dyspnea.  Was in hospital for chest pain.  She had chest xray last month which was unrevealing.  She feels short of breath with exertion.  She had a cough when she was in hospital, but not since.  She denies wheezing or sputum.  She denies palpitations.  She has noticed swelling in her hands and ankles.  She denies skin rash.  Her CPAP mask broke several weeks ago.  As a result she has not been able to use CPAP.  She has noticed more trouble sleeping w/o CPAP, and feeling more tired during the day.  Tests: CT chest 03/16/11>>RML GGO, LLL GGO, ascending thoracic aorta 4.4 cm, mild CM  PFT 01/26/11>>FEV1 2.35(101%), FEV1% 86, TLC 2.41(111%), DLCO 33%, no BD CXR 06/13/11>>no acute disease PSG 01/08/12>>AHI 32.5, SpO2 86%. Complex sleep apnea. CPAP 03/01/12>>CPAP 12 cm H2O>>AHI 3.9, +R, +S. 06/19/12 Change to auto CPAP 5 to 15 cm H2O  She  has a past medical history of Diabetes mellitus; Essential hypertension; Hypothyroidism; Anemia; GERD (gastroesophageal reflux disease); Hyperlipemia; Bowel obstruction (1997); Osteoarthritis; Chronic kidney disease; Gastric ulcer; CAD (coronary artery disease); and Shortness of breath.  She  has past surgical history that includes Colon resection (1990's); Hand surgery; Nissen fundoplication; Gastrostomy w/ feeding tube (1990's); Colon surgery; Abdominal hysterectomy (1970's); and Dilation and curettage of uterus.   Current Outpatient Prescriptions on File Prior to Visit  Medication Sig Dispense Refill  . acetaminophen (TYLENOL) 500 MG tablet Take 500 mg by mouth as needed.      Marland Kitchen albuterol (PROAIR HFA) 108 (90 BASE) MCG/ACT inhaler Inhale 2  puffs into the lungs every 6 (six) hours as needed for wheezing.  1 Inhaler  5  . amLODipine (NORVASC) 5 MG tablet TAKE 1 TABLET BY MOUTH DAILY  30 tablet  6  . aspirin EC 81 MG tablet Take 81 mg by mouth daily.      . citalopram (CELEXA) 20 MG tablet Take 20 mg by mouth as needed.       . Ferrous Sulfate Dried (FEOSOL) 200 (65 FE) MG TABS Take 2 tablets by mouth every morning.      . furosemide (LASIX) 40 MG tablet Take 20 mg by mouth daily as needed. For fluid retention       . gabapentin (NEURONTIN) 300 MG capsule Take 300 mg by mouth 3 (three) times daily.       Marland Kitchen glimepiride (AMARYL) 2 MG tablet Take 2 mg by mouth daily before breakfast.      . insulin glargine (LANTUS) 100 UNIT/ML injection Inject into the skin at bedtime. Take 45 units every morning and 40 units every evening       . insulin lispro (HUMALOG) 100 UNIT/ML injection Inject 12 Units into the skin 2 (two) times daily.      Marland Kitchen levothyroxine (SYNTHROID, LEVOTHROID) 150 MCG tablet Take 150 mcg by mouth daily.        Marland Kitchen losartan (COZAAR) 50 MG tablet TAKE 1 TABLET BY MOUTH DAILY  30 tablet  6  . pantoprazole (PROTONIX) 40 MG tablet Take 40 mg by mouth 2 (two) times daily.       Marland Kitchen  potassium chloride (KLOR-CON) 10 MEQ CR tablet Take 10 mEq by mouth daily.       . traMADol (ULTRAM) 50 MG tablet Take 50 mg by mouth 3 (three) times daily.        . nitroGLYCERIN (NITROSTAT) 0.4 MG SL tablet Place 1 tablet (0.4 mg total) under the tongue every 5 (five) minutes as needed for chest pain.  25 tablet  3  . pravastatin (PRAVACHOL) 80 MG tablet Take 1 tablet (80 mg total) by mouth every evening.  30 tablet  6   No current facility-administered medications on file prior to visit.   No Known Allergies   Physical Exam:  General - Obese HEENT - no sinus tenderness, no oral exudate, no LAN, MP 3, 2+ tonsils Cardiac - s1s2 regular, no murmur Chest - no wheeze/rales/dullness Abdomen - soft, colostomy RLQ Extremities - no edema, no  cyanosis/clubbing Neurologic - normal strength, CN intact Skin - no rashes Psychiatric - normal mood, behavior   Assessment/Plan: Jenna HellingVineet Cornelious Bartolucci, MD [facility] 06/19/2013, 2:20 PM Pager:  [phone] After 3pm call: [phone]

## 2013-06-19 NOTE — Patient Instructions (Signed)
Will schedule breathing test (PFT) Will get report from CPAP machine Follow up in 6 weeks

## 2013-06-30 ENCOUNTER — Telehealth: Payer: Self-pay | Admitting: Pulmonary Disease

## 2013-06-30 NOTE — Telephone Encounter (Signed)
LMOM x 1 

## 2013-07-01 ENCOUNTER — Other Ambulatory Visit: Payer: Self-pay | Admitting: Cardiology

## 2013-07-01 NOTE — Telephone Encounter (Signed)
Pt states she made the copay-online payment on Saturday. I called [employer] myself; was told that the patient has a bad debit for 13 months now and states that it has been paid in full. The order is now active and being processed. Pt should get a call back in about 24-48 hours regarding this order. Pt was made aware of this and will call us back if any other troubles.

## 2013-07-01 NOTE — Telephone Encounter (Signed)
LMTCBx2 for the pt. I did call [employer] and [facility]. She states the therapists did visit the pt and there is an order for a new mask but the pt states she cannot pay the copay at this time and she is supposed to call them back when she can pay the copay for the mask. I have LMTCB for the pt to discuss. Carron CurieJennifer Castillo, CMA

## 2013-07-01 NOTE — Telephone Encounter (Signed)
Patient returning call.

## 2013-07-02 ENCOUNTER — Encounter: Payer: Self-pay | Admitting: Pulmonary Disease

## 2013-07-07 ENCOUNTER — Telehealth: Payer: Self-pay | Admitting: Pulmonary Disease

## 2013-07-07 NOTE — Telephone Encounter (Signed)
Called spoke with pt. She is wanting to know where her CPAP mask is from [employer]. I advised her she would need to call them. Gave her [facility] phone #. Nothing further needed

## 2013-07-16 ENCOUNTER — Ambulatory Visit (INDEPENDENT_AMBULATORY_CARE_PROVIDER_SITE_OTHER): Payer: Medicare Other | Admitting: Cardiology

## 2013-07-16 ENCOUNTER — Encounter: Payer: Self-pay | Admitting: Cardiology

## 2013-07-16 VITALS — BP 116/80 | HR 71 | Ht 68.0 in | Wt 277.0 lb

## 2013-07-16 DIAGNOSIS — R0989 Other specified symptoms and signs involving the circulatory and respiratory systems: Secondary | ICD-10-CM

## 2013-07-16 DIAGNOSIS — R0609 Other forms of dyspnea: Secondary | ICD-10-CM

## 2013-07-16 DIAGNOSIS — E7800 Pure hypercholesterolemia, unspecified: Secondary | ICD-10-CM

## 2013-07-16 DIAGNOSIS — I2510 Atherosclerotic heart disease of native coronary artery without angina pectoris: Secondary | ICD-10-CM

## 2013-07-16 DIAGNOSIS — I10 Essential (primary) hypertension: Secondary | ICD-10-CM

## 2013-07-16 DIAGNOSIS — R0600 Dyspnea, unspecified: Secondary | ICD-10-CM

## 2013-07-16 MED ORDER — NITROGLYCERIN 0.4 MG SL SUBL: 0.4000 mg | SUBLINGUAL_TABLET | SUBLINGUAL | Status: DC | PRN

## 2013-07-16 NOTE — Progress Notes (Signed)
HPI This is a 68 year old female who is here today for a followup of CAD. Work up has included right and left cardiac catheterization which demonstrated normal filling pressures and no evidence of pulmonary hypertension. There was a 60-70% mid LAD stenosis which was significant by FFR. The stenosis in the proximal RCA was not significant. She underwent an angioplasty and drug-eluting stent placement to the LAD without complications. She is now being treated for sleep apnea and using CPAP  Since I last saw her she has had Increasing dyspnea with exertion. This is similar to her previous complaints that were her anginal equivalent. She says she'll get short of breath with mild activities. She is not describing chest pressure, neck or arm discomfort. She does have some fleeting sharp left upper pains in her chest occasionally. However, these are reproducible with activities. She's not having any palpitations, presyncope or syncope. She's had a slow and steady weight gain. She has chronic lower extremity edema.  No Known Allergies  Current Outpatient Prescriptions  Medication Sig Dispense Refill  . acetaminophen (TYLENOL) 500 MG tablet Take 500 mg by mouth as needed.      Marland Kitchen. albuterol (PROAIR HFA) 108 (90 BASE) MCG/ACT inhaler Inhale 2 puffs into the lungs every 6 (six) hours as needed for wheezing.  1 Inhaler  5  . amLODipine (NORVASC) 5 MG tablet TAKE 1 TABLET BY MOUTH DAILY  30 tablet  6  . aspirin EC 81 MG tablet Take 81 mg by mouth daily.      . citalopram (CELEXA) 20 MG tablet Take 20 mg by mouth as needed.       . Ferrous Sulfate Dried (FEOSOL) 200 (65 FE) MG TABS Take 2 tablets by mouth every morning.      . furosemide (LASIX) 40 MG tablet Take 20 mg by mouth daily as needed. For fluid retention       . gabapentin (NEURONTIN) 300 MG capsule Take 300 mg by mouth 3 (three) times daily.       Marland Kitchen. glimepiride (AMARYL) 2 MG tablet Take 2 mg by mouth daily before breakfast.      . insulin glargine  (LANTUS) 100 UNIT/ML injection Inject into the skin at bedtime. Take 45 units every morning and 40 units every evening       . insulin lispro (HUMALOG) 100 UNIT/ML injection Inject 12 Units into the skin 2 (two) times daily.      Marland Kitchen. levothyroxine (SYNTHROID, LEVOTHROID) 150 MCG tablet Take 150 mcg by mouth daily.        Marland Kitchen. losartan (COZAAR) 50 MG tablet TAKE 1 TABLET BY MOUTH DAILY  30 tablet  6  . pantoprazole (PROTONIX) 40 MG tablet Take 40 mg by mouth 2 (two) times daily.       . potassium chloride (KLOR-CON) 10 MEQ CR tablet Take 10 mEq by mouth daily.       . pravastatin (PRAVACHOL) 80 MG tablet Take 1 tablet (80 mg total) by mouth every evening.  30 tablet  6  . traMADol (ULTRAM) 50 MG tablet Take 50 mg by mouth 3 (three) times daily.        . nitroGLYCERIN (NITROSTAT) 0.4 MG SL tablet Place 1 tablet (0.4 mg total) under the tongue every 5 (five) minutes as needed for chest pain.  25 tablet  3   No current facility-administered medications for this visit.    Past Medical History  Diagnosis Date  . Diabetes mellitus   . Essential hypertension   .  Hypothyroidism   . Anemia   . GERD (gastroesophageal reflux disease)     PUD with UGI bleed in 8/12 dx by EGD  . Hyperlipemia   . Bowel obstruction 1997  . Osteoarthritis   . Chronic kidney disease     questionable  . Gastric ulcer   . CAD (coronary artery disease)     A. 11/2010 - Cath: 60% LAD, 50% RCA ;  B. s/p Promus DES to LAD (by FFR 12/12);  RCA ok by FFR 12/12  . Shortness of breath     Past Surgical History  Procedure Laterality Date  . Colon resection  1990's  . Hand surgery      cyst on left hand  . Nissen fundoplication      questionable - pt unsure of surgical name  . Gastrostomy w/ feeding tube  1990's  . Colon surgery      COLONECTOMY WEARS A BAG  . Abdominal hysterectomy  1970's  . Dilation and curettage of uterus      "before hysterectomy"    ROS:  As stated in the HPI and negative for all other  systems.  PHYSICAL EXAM BP 116/80  Pulse 71  Ht 5\' 8"  (1.727 m)  Wt 277 lb (125.646 kg)  BMI 42.13 kg/m2 GENERAL:  Well appearing NECK:  No jugular venous distention, waveform within normal limits, carotid upstroke brisk and symmetric, no bruits, no thyromegaly LUNGS:  Clear to auscultation bilaterally BACK:  No CVA tenderness CHEST:  Unremarkable HEART:  PMI not displaced or sustained,S1 and S2 within normal limits, no S3, no S4, no clicks, no rubs, no murmurs ABD:  Flat, positive bowel sounds normal in frequency in pitch, no bruits, no rebound, no guarding, no midline pulsatile mass, no hepatomegaly, no splenomegaly, colostomy bag EXT:  2 plus pulses throughout, mild bilateral ankle edema, no cyanosis no clubbing SKIN:  Bruising on her back.  EKG:  65, axis within normal limits, intervals within normal limits, no acute ST-T wave changes.  07/16/2013   ASSESSMENT AND PLAN   CAD (coronary artery disease) -  Given her dyspnea which was an anginal equivalent before she will need to be screened for obstructive CAD.  She would not be able to walk on a treadmill.  She will have a [employer].  Hypertension -  The blood pressure is at target. No change in medications is indicated. We will continue with therapeutic lifestyle changes (TLC).  Edema -  She  continues to wear compression stockings. She will continue with salt and fluid restriction.  No change in therapy is indicated.   Dyslipidemia -  I will check a lipid profile when she presents for her stress test.   Obesity - We have discussed strategies for treatment of this.

## 2013-07-16 NOTE — Patient Instructions (Signed)
The current medical regimen is effective;  continue present plan and medications.  Your physician has requested that you have a lexiscan myoview. For further information please visit [email or web address]. Please follow instruction sheet, as given.  Follow up in 1 year with Dr Hochrein.  You will receive a letter in the mail 2 months before you are due.  Please call us when you receive this letter to schedule your follow up appointment.  

## 2013-07-25 ENCOUNTER — Inpatient Hospital Stay (HOSPITAL_COMMUNITY): Payer: Medicare Other

## 2013-07-25 ENCOUNTER — Encounter: Payer: Self-pay | Admitting: Pulmonary Disease

## 2013-07-25 ENCOUNTER — Encounter (HOSPITAL_COMMUNITY): Payer: Self-pay

## 2013-07-25 ENCOUNTER — Ambulatory Visit (HOSPITAL_COMMUNITY): Admission: RE | Admit: 2013-07-25 | Payer: Medicare Other | Source: Ambulatory Visit

## 2013-07-25 ENCOUNTER — Ambulatory Visit (INDEPENDENT_AMBULATORY_CARE_PROVIDER_SITE_OTHER): Payer: Medicare Other | Admitting: Pulmonary Disease

## 2013-07-25 ENCOUNTER — Inpatient Hospital Stay (HOSPITAL_COMMUNITY)
Admission: AD | Admit: 2013-07-25 | Discharge: 2013-07-28 | DRG: 155 | Disposition: A | Discharge status: Home / Self Care | Payer: Medicare Other | Source: Ambulatory Visit | Attending: Pulmonary Disease | Admitting: Pulmonary Disease

## 2013-07-25 VITALS — BP 150/64 | HR 60 | Temp 97.9°F

## 2013-07-25 DIAGNOSIS — Z933 Colostomy status: Secondary | ICD-10-CM

## 2013-07-25 DIAGNOSIS — G4731 Primary central sleep apnea: Secondary | ICD-10-CM

## 2013-07-25 DIAGNOSIS — Z8041 Family history of malignant neoplasm of ovary: Secondary | ICD-10-CM

## 2013-07-25 DIAGNOSIS — D649 Anemia, unspecified: Secondary | ICD-10-CM

## 2013-07-25 DIAGNOSIS — R0602 Shortness of breath: Secondary | ICD-10-CM

## 2013-07-25 DIAGNOSIS — K219 Gastro-esophageal reflux disease without esophagitis: Secondary | ICD-10-CM | POA: Diagnosis present

## 2013-07-25 DIAGNOSIS — Z8249 Family history of ischemic heart disease and other diseases of the circulatory system: Secondary | ICD-10-CM

## 2013-07-25 DIAGNOSIS — J984 Other disorders of lung: Secondary | ICD-10-CM | POA: Diagnosis present

## 2013-07-25 DIAGNOSIS — R079 Chest pain, unspecified: Secondary | ICD-10-CM

## 2013-07-25 DIAGNOSIS — R0600 Dyspnea, unspecified: Secondary | ICD-10-CM

## 2013-07-25 DIAGNOSIS — G4739 Other sleep apnea: Secondary | ICD-10-CM

## 2013-07-25 DIAGNOSIS — R0609 Other forms of dyspnea: Secondary | ICD-10-CM

## 2013-07-25 DIAGNOSIS — Z6841 Body Mass Index (BMI) 40.0 and over, adult: Secondary | ICD-10-CM

## 2013-07-25 DIAGNOSIS — I10 Essential (primary) hypertension: Secondary | ICD-10-CM

## 2013-07-25 DIAGNOSIS — G8929 Other chronic pain: Secondary | ICD-10-CM | POA: Diagnosis present

## 2013-07-25 DIAGNOSIS — F411 Generalized anxiety disorder: Secondary | ICD-10-CM | POA: Diagnosis present

## 2013-07-25 DIAGNOSIS — E039 Hypothyroidism, unspecified: Secondary | ICD-10-CM

## 2013-07-25 DIAGNOSIS — I200 Unstable angina: Secondary | ICD-10-CM

## 2013-07-25 DIAGNOSIS — Z9049 Acquired absence of other specified parts of digestive tract: Secondary | ICD-10-CM

## 2013-07-25 DIAGNOSIS — Z8711 Personal history of peptic ulcer disease: Secondary | ICD-10-CM

## 2013-07-25 DIAGNOSIS — G4733 Obstructive sleep apnea (adult) (pediatric): Secondary | ICD-10-CM | POA: Diagnosis present

## 2013-07-25 DIAGNOSIS — E785 Hyperlipidemia, unspecified: Secondary | ICD-10-CM

## 2013-07-25 DIAGNOSIS — R0989 Other specified symptoms and signs involving the circulatory and respiratory systems: Secondary | ICD-10-CM

## 2013-07-25 DIAGNOSIS — R0789 Other chest pain: Secondary | ICD-10-CM | POA: Diagnosis present

## 2013-07-25 DIAGNOSIS — R609 Edema, unspecified: Secondary | ICD-10-CM

## 2013-07-25 DIAGNOSIS — E119 Type 2 diabetes mellitus without complications: Secondary | ICD-10-CM

## 2013-07-25 DIAGNOSIS — Z800 Family history of malignant neoplasm of digestive organs: Secondary | ICD-10-CM

## 2013-07-25 DIAGNOSIS — G4730 Sleep apnea, unspecified: Secondary | ICD-10-CM

## 2013-07-25 DIAGNOSIS — R42 Dizziness and giddiness: Secondary | ICD-10-CM | POA: Diagnosis present

## 2013-07-25 DIAGNOSIS — Z833 Family history of diabetes mellitus: Secondary | ICD-10-CM

## 2013-07-25 DIAGNOSIS — I2510 Atherosclerotic heart disease of native coronary artery without angina pectoris: Secondary | ICD-10-CM

## 2013-07-25 DIAGNOSIS — J383 Other diseases of vocal cords: Principal | ICD-10-CM

## 2013-07-25 LAB — COMPREHENSIVE METABOLIC PANEL
ALBUMIN: 3.1 g/dL — AB (ref 3.5–5.2)
ALT: 24 U/L (ref 0–35)
AST: 24 U/L (ref 0–37)
Alkaline Phosphatase: 73 U/L (ref 39–117)
BUN: 9 mg/dL (ref 6–23)
CO2: 26 mEq/L (ref 19–32)
Calcium: 9.5 mg/dL (ref 8.4–10.5)
Chloride: 106 mEq/L (ref 96–112)
Creatinine, Ser: 1.12 mg/dL — ABNORMAL HIGH (ref 0.50–1.10)
GFR calc non Af Amer: 50 mL/min — ABNORMAL LOW (ref >90)
GFR, EST AFRICAN AMERICAN: 58 mL/min — AB (ref >90)
Glucose, Bld: 134 mg/dL — ABNORMAL HIGH (ref 70–99)
POTASSIUM: 3.7 meq/L (ref 3.7–5.3)
Sodium: 144 mEq/L (ref 137–147)
Total Bilirubin: 0.4 mg/dL (ref 0.3–1.2)
Total Protein: 6.6 g/dL (ref 6.0–8.3)

## 2013-07-25 LAB — CBC
HCT: 33.5 % — ABNORMAL LOW (ref 36.0–46.0)
Hemoglobin: 11.1 g/dL — ABNORMAL LOW (ref 12.0–15.0)
MCH: 31.9 pg (ref 26.0–34.0)
MCHC: 33.1 g/dL (ref 30.0–36.0)
MCV: 96.3 fL (ref 78.0–100.0)
Platelets: 185 10*3/uL (ref 150–400)
RBC: 3.48 MIL/uL — ABNORMAL LOW (ref 3.87–5.11)
RDW: 14.7 % (ref 11.5–15.5)
WBC: 7.7 10*3/uL (ref 4.0–10.5)

## 2013-07-25 LAB — GLUCOSE, CAPILLARY
GLUCOSE-CAPILLARY: 130 mg/dL — AB (ref 70–99)
Glucose-Capillary: 224 mg/dL — ABNORMAL HIGH (ref 70–99)
Glucose-Capillary: 108 mg/dL — ABNORMAL HIGH (ref 70–99)

## 2013-07-25 LAB — MRSA PCR SCREENING: MRSA by PCR: NEGATIVE

## 2013-07-25 LAB — TROPONIN I: Troponin I: <0.3 ng/mL (ref <0.30)

## 2013-07-25 MED ORDER — ALBUTEROL SULFATE (2.5 MG/3ML) 0.083% IN NEBU
2.5000 mg | INHALATION_SOLUTION | RESPIRATORY_TRACT | Status: DC | PRN
  Administered 2013-07-25: 2.5 mg via RESPIRATORY_TRACT
  Filled 2013-07-25: qty 3

## 2013-07-25 MED ORDER — LOSARTAN POTASSIUM 50 MG PO TABS
50.0000 mg | ORAL_TABLET | Freq: Every day | ORAL | Status: DC
  Administered 2013-07-26 – 2013-07-28 (×3): 50 mg via ORAL
  Filled 2013-07-25 (×4): qty 1

## 2013-07-25 MED ORDER — SALINE SPRAY 0.65 % NA SOLN
1.0000 | NASAL | Status: DC | PRN
  Filled 2013-07-25: qty 44

## 2013-07-25 MED ORDER — SODIUM CHLORIDE 0.9 % IV SOLN: INTRAVENOUS | Status: DC

## 2013-07-25 MED ORDER — HEPARIN SODIUM (PORCINE) 5000 UNIT/ML IJ SOLN
5000.0000 [IU] | Freq: Three times a day (TID) | INTRAMUSCULAR | Status: DC
  Administered 2013-07-25 – 2013-07-28 (×8): 5000 [IU] via SUBCUTANEOUS
  Filled 2013-07-25 (×11): qty 1

## 2013-07-25 MED ORDER — AMLODIPINE BESYLATE 5 MG PO TABS
5.0000 mg | ORAL_TABLET | Freq: Every day | ORAL | Status: DC
  Administered 2013-07-26 – 2013-07-28 (×3): 5 mg via ORAL
  Filled 2013-07-25 (×4): qty 1

## 2013-07-25 MED ORDER — INSULIN GLARGINE 100 UNIT/ML ~~LOC~~ SOLN
30.0000 [IU] | Freq: Every day | SUBCUTANEOUS | Status: DC
  Administered 2013-07-25 – 2013-07-27 (×3): 30 [IU] via SUBCUTANEOUS
  Filled 2013-07-25 (×4): qty 0.3

## 2013-07-25 MED ORDER — ASPIRIN 81 MG PO CHEW
81.0000 mg | CHEWABLE_TABLET | Freq: Every day | ORAL | Status: DC
  Administered 2013-07-26 – 2013-07-28 (×3): 81 mg via ORAL
  Filled 2013-07-25 (×4): qty 1

## 2013-07-25 MED ORDER — GLIMEPIRIDE 2 MG PO TABS
2.0000 mg | ORAL_TABLET | Freq: Every day | ORAL | Status: DC
  Administered 2013-07-26 – 2013-07-28 (×3): 2 mg via ORAL
  Filled 2013-07-25 (×4): qty 1

## 2013-07-25 MED ORDER — CITALOPRAM HYDROBROMIDE 20 MG PO TABS
20.0000 mg | ORAL_TABLET | Freq: Every day | ORAL | Status: DC
  Administered 2013-07-26 – 2013-07-28 (×3): 20 mg via ORAL
  Filled 2013-07-25 (×4): qty 1

## 2013-07-25 MED ORDER — INSULIN GLARGINE 100 UNIT/ML ~~LOC~~ SOLN
45.0000 [IU] | Freq: Every day | SUBCUTANEOUS | Status: DC
  Filled 2013-07-25: qty 0.45

## 2013-07-25 MED ORDER — FLUTICASONE PROPIONATE 50 MCG/ACT NA SUSP
2.0000 | Freq: Every day | NASAL | Status: DC
  Administered 2013-07-25 – 2013-07-26 (×2): 2 via NASAL
  Filled 2013-07-25: qty 16

## 2013-07-25 MED ORDER — LEVOTHYROXINE SODIUM 150 MCG PO TABS
150.0000 ug | ORAL_TABLET | Freq: Every day | ORAL | Status: DC
  Administered 2013-07-26 – 2013-07-28 (×3): 150 ug via ORAL
  Filled 2013-07-25 (×4): qty 1

## 2013-07-25 MED ORDER — INSULIN GLARGINE 100 UNIT/ML ~~LOC~~ SOLN: 30.0000 [IU] | Freq: Every day | SUBCUTANEOUS | Status: DC

## 2013-07-25 MED ORDER — PANTOPRAZOLE SODIUM 40 MG PO TBEC
40.0000 mg | DELAYED_RELEASE_TABLET | Freq: Two times a day (BID) | ORAL | Status: DC
  Administered 2013-07-25 – 2013-07-28 (×6): 40 mg via ORAL
  Filled 2013-07-25 (×8): qty 1

## 2013-07-25 MED ORDER — NITROGLYCERIN 0.4 MG SL SUBL
0.4000 mg | SUBLINGUAL_TABLET | SUBLINGUAL | Status: DC | PRN
Start: 2013-07-25 — End: 2013-07-28

## 2013-07-25 MED ORDER — TRAMADOL HCL 50 MG PO TABS
50.0000 mg | ORAL_TABLET | Freq: Three times a day (TID) | ORAL | Status: DC | PRN
  Administered 2013-07-26: 50 mg via ORAL
  Filled 2013-07-25: qty 1

## 2013-07-25 MED ORDER — GABAPENTIN 300 MG PO CAPS
300.0000 mg | ORAL_CAPSULE | Freq: Three times a day (TID) | ORAL | Status: DC
  Administered 2013-07-25 – 2013-07-28 (×9): 300 mg via ORAL
  Filled 2013-07-25 (×12): qty 1

## 2013-07-25 MED ORDER — ALPRAZOLAM 0.5 MG PO TABS: 0.5000 mg | ORAL_TABLET | Freq: Three times a day (TID) | ORAL | Status: DC | PRN

## 2013-07-25 MED ORDER — INSULIN ASPART 100 UNIT/ML ~~LOC~~ SOLN
0.0000 [IU] | Freq: Three times a day (TID) | SUBCUTANEOUS | Status: DC
  Administered 2013-07-25: 2 [IU] via SUBCUTANEOUS
  Administered 2013-07-26: 3 [IU] via SUBCUTANEOUS
  Administered 2013-07-26: 2 [IU] via SUBCUTANEOUS
  Administered 2013-07-27: 3 [IU] via SUBCUTANEOUS
  Administered 2013-07-27: 2 [IU] via SUBCUTANEOUS
  Administered 2013-07-27: 3 [IU] via SUBCUTANEOUS
  Administered 2013-07-28: 2 [IU] via SUBCUTANEOUS

## 2013-07-25 NOTE — Progress Notes (Signed)
Chief Complaint  Patient presents with  . Follow-up    with pft results. SOB with little activity. Pt felt CP with pft this morning. Wearing CPAP nightly for 6 hours. Denies issues with the machine but feels the pressure sometimes isnt enough.     History of Present Illness: Jenna Diaz is a 68 y.o. female never smoker with complex sleep apnea and dyspnea.  She had PFT done earlier today.  She c/o feeling like she can get air into her lungs and wheezing in her throat.  She also feels chest discomfort.  She is feeling dizzy.  Tests: CT chest 03/16/11>>RML GGO, LLL GGO, ascending thoracic aorta 4.4 cm, mild CM  PFT 01/26/11>>FEV1 2.35(101%), FEV1% 86, TLC 2.41(111%), DLCO 33%, no BD CXR 06/13/11>>no acute disease PSG 01/08/12>>AHI 32.5, SpO2 86%. Complex sleep apnea. CPAP 03/01/12>>CPAP 12 cm H2O>>AHI 3.9, +R, +S. 06/19/12 Change to auto CPAP 5 to 15 cm H2O CPAP 12/16/12 to 06/13/13 >> Used on 80 of 100 nights with average 5 hr 6 min.  Average AHI 2.6 with CPAP 12 cm H2O.  She  has a past medical history of Diabetes mellitus; Essential hypertension; Hypothyroidism; Anemia; GERD (gastroesophageal reflux disease); Hyperlipemia; Bowel obstruction (1997); Osteoarthritis; Chronic kidney disease; Gastric ulcer; CAD (coronary artery disease); and Shortness of breath.  She  has past surgical history that includes Colon resection (1990's); Hand surgery; Nissen fundoplication; Gastrostomy w/ feeding tube (1990's); Colon surgery; Abdominal hysterectomy (1970's); and Dilation and curettage of uterus.   Current Outpatient Prescriptions on File Prior to Visit  Medication Sig Dispense Refill  . acetaminophen (TYLENOL) 500 MG tablet Take 500 mg by mouth as needed.      Marland Kitchen albuterol (PROAIR HFA) 108 (90 BASE) MCG/ACT inhaler Inhale 2 puffs into the lungs every 6 (six) hours as needed for wheezing.  1 Inhaler  5  . amLODipine (NORVASC) 5 MG tablet TAKE 1 TABLET BY MOUTH DAILY  30 tablet  6  . aspirin EC  81 MG tablet Take 81 mg by mouth daily.      . citalopram (CELEXA) 20 MG tablet Take 20 mg by mouth as needed.       . Ferrous Sulfate Dried (FEOSOL) 200 (65 FE) MG TABS Take 2 tablets by mouth every morning.      . furosemide (LASIX) 40 MG tablet Take 20 mg by mouth daily as needed. For fluid retention       . gabapentin (NEURONTIN) 300 MG capsule Take 300 mg by mouth 3 (three) times daily.       Marland Kitchen glimepiride (AMARYL) 2 MG tablet Take 2 mg by mouth daily before breakfast.      . insulin glargine (LANTUS) 100 UNIT/ML injection Inject into the skin at bedtime. Take 45 units every morning and 40 units every evening       . insulin lispro (HUMALOG) 100 UNIT/ML injection Inject 12 Units into the skin 2 (two) times daily.      Marland Kitchen levothyroxine (SYNTHROID, LEVOTHROID) 150 MCG tablet Take 150 mcg by mouth daily.        Marland Kitchen losartan (COZAAR) 50 MG tablet TAKE 1 TABLET BY MOUTH DAILY  30 tablet  6  . nitroGLYCERIN (NITROSTAT) 0.4 MG SL tablet Place 1 tablet (0.4 mg total) under the tongue every 5 (five) minutes as needed for chest pain.  25 tablet  3  . pantoprazole (PROTONIX) 40 MG tablet Take 40 mg by mouth 2 (two) times daily.       Marland Kitchen  traMADol (ULTRAM) 50 MG tablet Take 50 mg by mouth 3 (three) times daily.        . pravastatin (PRAVACHOL) 80 MG tablet Take 1 tablet (80 mg total) by mouth every evening.  30 tablet  6   No current facility-administered medications on file prior to visit.   No Known Allergies   Physical Exam:  General - Obese HEENT - no sinus tenderness, no oral exudate, no LAN, MP 3, 2+ tonsils, wheeze in throat Cardiac - s1s2 regular, no murmur Chest - no wheeze/rales/dullness Abdomen - soft, colostomy RLQ Extremities - no edema, no cyanosis/clubbing Neurologic - normal strength, CN intact Skin - no rashes Psychiatric - normal mood, behavior   Assessment/Plan: Coralyn HellingVineet Ivie Savitt, MD [facility] Pulmonary/Critical Care 07/25/2013, 12:03 PM Pager:  [phone] After 3pm call:  [phone]

## 2013-07-25 NOTE — Progress Notes (Signed)
Spoke with RN in regards to prior orders placed for BIPAP prn on dayshift-- upon exam patient denies sob at this time with spo2 98% on RA. Bilateral BS to auscultation diminished. Patient speaking in complete sentences. BIPAP not needed at this time. RT will continue to monitor and assess, patient states wearing CPAP HS this will be addressed per RN.

## 2013-07-25 NOTE — Progress Notes (Signed)
PFT done today. 

## 2013-07-25 NOTE — Progress Notes (Signed)
eLink Physician-Brief Progress Note Patient Name: Jenna SimpersFrances P Diaz DOB: 19-Dec-1945 MRN: [id number]  Date of Service  07/25/2013   HPI/Events of Note   EMD initial assessment Chart reviewed Admitted for resp distress, no resting comfortably  eICU Interventions  Admit labs ordered Requested bedside RN to obtain EKG   Intervention Category Intermediate Interventions: Respiratory distress - evaluation and management  MCQUAID, DOUGLAS 07/25/2013, 8:11 PM

## 2013-07-25 NOTE — Progress Notes (Signed)
CARE MANAGEMENT NOTE 07/25/2013  Patient:  Jenna Diaz,Jenna Diaz   Account Number:  [id number]  Date Initiated:  07/25/2013  Documentation initiated by:  Bessie Boyte  Subjective/Objective Assessment:   patient increased wob after pft done in the mdo.  bipap to be started upon arrival to the sdu, history of cops, nonsmoker.     Action/Plan:   lives at a private residence with three other sisters.   Anticipated DC Date:  07/28/2013   Anticipated DC Plan:  HOME W HOME HEALTH SERVICES  In-house referral  NA      DC Planning Services  NA      [facility]  [facility]   Choice offered to / List presented to:  NA   DME arranged  NA      DME agency  NA     HH arranged  NA      HH agency  NA   Status of service:  In process, will continue to follow Medicare Important Message given?  NA - LOS <3 / Initial given by admissions (If response is "NO", the following Medicare IM given date fields will be blank) Date Medicare IM given:   Date Additional Medicare IM given:    Discharge Disposition:    Per UR Regulation:  Reviewed for med. necessity/level of care/duration of stay  If discussed at Long Length of Stay Meetings, dates discussed:    Comments:  [id number]/Kwane Rohl Stark JockDavis, RN, BSN, [place] [phone] Chart Reviewed for discharge and hospital needs. Discharge needs at time of review:  None present will follow for needs. Review of patient progress due on [id number].

## 2013-07-25 NOTE — Plan of Care (Signed)
Problem: Consults Goal: Diabetes Guidelines if Diabetic/Glucose > 140 If diabetic or lab glucose is > 140 mg/dl - Initiate Diabetes/Hyperglycemia Guidelines & Document Interventions  Outcome: Completed/Met Date Met:  07/25/13 [facility] blood sugar checks

## 2013-07-25 NOTE — H&P (Signed)
PULMONARY / CRITICAL CARE MEDICINE   Name: Jenna Diaz MRN: [id number] DOB: 1945-06-16    ADMISSION DATE: 07/25/2013  CHIEF COMPLAINT:  Short of breath  BRIEF PATIENT DESCRIPTION:  68 yo never smoker admitted from pulmonary office with dyspnea and chest pain after undergoing PFT.  Followed by Dr. Craige Cotta in pulmonary office.  SIGNIFICANT EVENTS: 3/27 Admit  STUDIES:   LINES / TUBES: PIV  CULTURES: None  ANTIBIOTICS: None  HISTORY OF PRESENT ILLNESS:   68 yo female has trouble with dyspnea for years.  She is being evaluated by cardiology and pulmonary.  Concern has been for CAD, diastolic dysfunction, and deconditioning.  She present to pulmonary office for PFT.  After testing she was noted to have severe difficulty with her breathing.  She was anxious and dizzy.  She felt like she could not get air into her lungs.  She was also having mid sternal chest discomfort.  As a result decision made to proceed with hospital admission.  PAST MEDICAL HISTORY :  Past Medical History  Diagnosis Date  . Diabetes mellitus   . Essential hypertension   . Hypothyroidism   . Anemia   . GERD (gastroesophageal reflux disease)     PUD with UGI bleed in 8/12 dx by EGD  . Hyperlipemia   . Bowel obstruction 1997  . Osteoarthritis   . Chronic kidney disease     questionable  . Gastric ulcer   . CAD (coronary artery disease)     A. 11/2010 - Cath: 60% LAD, 50% RCA ;  B. s/p Promus DES to LAD (by FFR 12/12);  RCA ok by FFR 12/12  . Shortness of breath    Past Surgical History  Procedure Laterality Date  . Colon resection  1990's  . Hand surgery      cyst on left hand  . Nissen fundoplication      questionable - pt unsure of surgical name  . Gastrostomy w/ feeding tube  1990's  . Colon surgery      COLONECTOMY WEARS A BAG  . Abdominal hysterectomy  1970's  . Dilation and curettage of uterus      "before hysterectomy"   Prior to Admission medications   Medication Sig Start Date  End Date Taking? Authorizing Provider  acetaminophen (TYLENOL) 500 MG tablet Take 500 mg by mouth as needed.    Historical Provider, MD  albuterol (PROAIR HFA) 108 (90 BASE) MCG/ACT inhaler Inhale 2 puffs into the lungs every 6 (six) hours as needed for wheezing. 12/19/11 06/19/14  Coralyn Helling, MD  amLODipine (NORVASC) 5 MG tablet TAKE 1 TABLET BY MOUTH DAILY 07/01/13   Rollene Rotunda, MD  aspirin EC 81 MG tablet Take 81 mg by mouth daily. 05/22/12   Rollene Rotunda, MD  citalopram (CELEXA) 20 MG tablet Take 20 mg by mouth as needed.  11/01/11   Historical Provider, MD  Ferrous Sulfate Dried (FEOSOL) 200 (65 FE) MG TABS Take 2 tablets by mouth every morning.    Historical Provider, MD  furosemide (LASIX) 40 MG tablet Take 20 mg by mouth daily as needed. For fluid retention     Historical Provider, MD  gabapentin (NEURONTIN) 300 MG capsule Take 300 mg by mouth 3 (three) times daily.     Historical Provider, MD  glimepiride (AMARYL) 2 MG tablet Take 2 mg by mouth daily before breakfast.    Historical Provider, MD  insulin glargine (LANTUS) 100 UNIT/ML injection Inject into the skin at bedtime. Take 45 units  every morning and 40 units every evening     Historical Provider, MD  insulin lispro (HUMALOG) 100 UNIT/ML injection Inject 12 Units into the skin 2 (two) times daily.    Historical Provider, MD  levothyroxine (SYNTHROID, LEVOTHROID) 150 MCG tablet Take 150 mcg by mouth daily.      Historical Provider, MD  losartan (COZAAR) 50 MG tablet TAKE 1 TABLET BY MOUTH DAILY 07/01/13   Rollene Rotunda, MD  nitroGLYCERIN (NITROSTAT) 0.4 MG SL tablet Place 1 tablet (0.4 mg total) under the tongue every 5 (five) minutes as needed for chest pain. 07/16/13 12/07/14  Rollene Rotunda, MD  NOVOFINE 32G X 6 MM MISC     Historical Provider, MD  pantoprazole (PROTONIX) 40 MG tablet Take 40 mg by mouth 2 (two) times daily.     Historical Provider, MD  potassium chloride (K-DUR,KLOR-CON) 10 MEQ tablet Take 1 tablet by mouth daily.     Historical Provider, MD  pravastatin (PRAVACHOL) 80 MG tablet Take 1 tablet (80 mg total) by mouth every evening. 07/04/11 07/16/13  Rande Brunt, PA-C  traMADol (ULTRAM) 50 MG tablet Take 50 mg by mouth 3 (three) times daily.      Historical Provider, MD   No Known Allergies  FAMILY HISTORY:  Family History  Problem Relation Age of Onset  . Heart disease Mother     MI  . Heart disease Maternal Grandmother   . Heart disease Paternal Grandmother   . Ovarian cancer Sister   . Diabetes Sister     x3  . Heart disease Brother   . Esophageal cancer Father   . Colon cancer Neg Hx   . Stomach cancer Neg Hx    SOCIAL HISTORY:  reports that she has never smoked. She has never used smokeless tobacco. She reports that she does not drink alcohol or use illicit drugs.  REVIEW OF SYSTEMS:   Negative except above  SUBJECTIVE:   VITAL SIGNS: Temp:  [97.9 F (36.6 C)] 97.9 F (36.6 C) (03/27 1158) Pulse Rate:  [60] 60 (03/27 1158) BP: (150)/(64) 150/64 mmHg (03/27 1158) SpO2:  [99 %] 99 % (03/27 1158) INTAKE / OUTPUT: Intake/Output   None     PHYSICAL EXAMINATION: General:  Anxious, increased WOB Neuro:  Alert, follows commands, normal strength HEENT:  Pupils reactive, no sinus tenderness, no oral exudate, wheeze over neck Cardiovascular:  Regular, no murmur Lungs:  B/l wheeze clear with purse lip breathing Abdomen:  Soft, nontender, colostomy in place Musculoskeletal:  No edema Skin:  No rashes  LABS:  CBC No results found for this basename: WBC, HGB, HCT, PLT,  in the last 168 hours Coag's No results found for this basename: APTT, INR,  in the last 168 hours BMET No results found for this basename: NA, K, CL, CO2, BUN, CREATININE, GLUCOSE,  in the last 168 hours Electrolytes No results found for this basename: CALCIUM, MG, PHOS,  in the last 168 hours Sepsis Markers No results found for this basename: LATICACIDVEN, PROCALCITON, O2SATVEN,  in the last 168 hours ABG No  results found for this basename: PHART, PCO2ART, PO2ART,  in the last 168 hours Liver Enzymes No results found for this basename: AST, ALT, ALKPHOS, BILITOT, ALBUMIN,  in the last 168 hours Cardiac Enzymes No results found for this basename: TROPONINI, PROBNP,  in the last 168 hours Glucose No results found for this basename: GLUCAP,  in the last 168 hours  Imaging No results found.   Labs, CXR, ECG pending.  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 P: -admit to SDU -BPAP, oxygen as needed -continue bid protonix -add flonase, nasal irrigation -prn albuterol -f/u CXR  A: Chest pain with hx of CAD, hyperlipidemia, and HTN. P: -monitor heart rhythm -f/u ECG, cardiac enzymes -defer cardiology assessment for now -continue ASA, pravachol, cozaar, amlodipine  A: Hx of OSA. P: -BiPAP in place of CPAP while in hospital  A: Hx of DM, hypothyroidism. P: -SSI, amaryl, lantus -CHO modified diet -continue levothyroxine  A: Hx of Gastric ulcer. Hx of colon resection. P: -bid protonix -colostomy care  A: Anxiety, chronic pain. P: -continue celexa, neurotin, tramadol -prn xanax  Updated family about plan.  Coralyn HellingVineet Aroura Vasudevan, MD [facility] Pulmonary/Critical Care 07/25/2013, 1:03 PM Pager:  [phone] After 3pm call: [phone]

## 2013-07-25 NOTE — Assessment & Plan Note (Signed)
She c/o respiratory distress after breathing test this AM.  Her exam is suggestive of vocal cord dysfunction, but she is in considerable distress.  She will need to be admitted to SDU and started on BiPAP.  Will treat sinuses and continue anti-reflux therapy.

## 2013-07-26 DIAGNOSIS — J383 Other diseases of vocal cords: Principal | ICD-10-CM

## 2013-07-26 LAB — GLUCOSE, CAPILLARY
GLUCOSE-CAPILLARY: 146 mg/dL — AB (ref 70–99)
Glucose-Capillary: 108 mg/dL — ABNORMAL HIGH (ref 70–99)
Glucose-Capillary: 171 mg/dL — ABNORMAL HIGH (ref 70–99)
Glucose-Capillary: 94 mg/dL (ref 70–99)

## 2013-07-26 LAB — TROPONIN I: Troponin I: <0.3 ng/mL (ref <0.30)

## 2013-07-26 NOTE — Progress Notes (Signed)
Patient doesn't want to wear the CPAP tonight.  Patient states that the hospital supplied machine was too loud and uncomfortable. Advised patient that if she changes mind to give call .  RT will continue to monitor.

## 2013-07-26 NOTE — Progress Notes (Signed)
Pt received in rm 1508. Alert and oriented and in no resp distress at this time. Vwilliams,rn.  

## 2013-07-26 NOTE — Progress Notes (Addendum)
PULMONARY / CRITICAL CARE MEDICINE   Name: Jenna Diaz MRN: [id number] DOB: 01-Jan-1946    ADMISSION DATE: 07/25/2013  CHIEF COMPLAINT:  Short of breath  BRIEF PATIENT DESCRIPTION:  68 yo never smoker admitted from pulmonary office with dyspnea and chest pain after undergoing PFT.  Followed by Dr. Craige CottaSood in pulmonary office.  SIGNIFICANT EVENTS: 3/27 Admit  STUDIES:   LINES / TUBES: PIV  CULTURES: None  ANTIBIOTICS: None  HISTORY OF PRESENT ILLNESS:   68 yo female has trouble with dyspnea for years.  She is being evaluated by cardiology and pulmonary.  Concern has been for CAD, diastolic dysfunction, and deconditioning.  She present to pulmonary office for PFT.  After testing she was noted to have severe difficulty with her breathing.  She was anxious and dizzy.  She felt like she could not get air into her lungs.  She was also having mid sternal chest discomfort.  As a result decision made to proceed with hospital admission.  SUBJECTIVE: Dyspnea improved oob to chair Denies CP Afebrile, no cough  VITAL SIGNS: Temp:  [97.9 F (36.6 C)-98.6 F (37 C)] 98.2 F (36.8 C) (03/28 0000) Pulse Rate:  [49-61] 49 (03/28 0800) Resp:  [11-16] 16 (03/28 0800) BP: (102-150)/(41-64) 128/53 mmHg (03/28 0800) SpO2:  [94 %-100 %] 98 % (03/28 0800) Weight:  [124.3 kg (274 lb 0.5 oz)-127.8 kg (281 lb 12 oz)] 124.3 kg (274 lb 0.5 oz) (03/28 0600) INTAKE / OUTPUT: Intake/Output     03/27 0701 - 03/28 0700 03/28 0701 - 03/29 0700   P.O. 600    Total Intake(mL/kg) 600 (4.8)    Urine (mL/kg/hr) 1350    Stool 350    Total Output 1700     Net -1100            PHYSICAL EXAMINATION: General:  Anxious appearing Neuro:  Alert, non focal, normal strength HEENT:  Pupils reactive, no sinus tenderness, no oral exudate Cardiovascular:  Regular, no murmur Lungs:  B/l decreased, pseudowheeze mild Abdomen:  Soft, nontender, colostomy in place Musculoskeletal:  No edema Skin:  No  rashes  LABS:  CBC  Recent Labs Lab 07/25/13 2031  WBC 7.7  HGB 11.1*  HCT 33.5*  PLT 185   Coag's No results found for this basename: APTT, INR,  in the last 168 hours BMET  Recent Labs Lab 07/25/13 2031  NA 144  K 3.7  CL 106  CO2 26  BUN 9  CREATININE 1.12*  GLUCOSE 134*   Electrolytes  Recent Labs Lab 07/25/13 2031  CALCIUM 9.5   Sepsis Markers No results found for this basename: LATICACIDVEN, PROCALCITON, O2SATVEN,  in the last 168 hours ABG No results found for this basename: PHART, PCO2ART, PO2ART,  in the last 168 hours Liver Enzymes  Recent Labs Lab 07/25/13 2031  AST 24  ALT 24  ALKPHOS 73  BILITOT 0.4  ALBUMIN 3.1*   Cardiac Enzymes  Recent Labs Lab 07/25/13 2031 07/26/13 0217  TROPONINI <0.30 <0.30   Glucose  Recent Labs Lab 07/25/13 1312 07/25/13 1707 07/25/13 2212  GLUCAP 224* 130* 108*    Imaging Dg Chest Port 1 View  07/25/2013   CLINICAL DATA:  Short of breath.  Wheezing.  Hypertension.  EXAM: PORTABLE CHEST - 1 VIEW  COMPARISON:  05/03/2013  FINDINGS: Cardiac silhouette is normal in size and configuration. Normal mediastinal and hilar contours. Clear lungs. No pleural effusion or pneumothorax. The bony thorax is grossly intact.  IMPRESSION: No active disease.  Electronically Signed   By: Amie Portland M.D.   On: 07/25/2013 13:57     Labs, CXR, ECG pending.  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 P:- -continue bid protonix -add flonase, nasal irrigation -prn albuterol   A: Chest pain with hx of CAD, hyperlipidemia, and HTN  -resolved. P: - -defer cardiology assessment for now -continue ASA, pravachol, cozaar, amlodipine  A: Hx of OSA. P: -noct CPAP   A: Hx of DM, hypothyroidism. P: -SSI, amaryl, lantus -CHO modified diet -continue levothyroxine  A: Hx of Gastric ulcer. Hx of colon resection. P: -bid  protonix -colostomy care  A: Anxiety, chronic pain. P: -continue celexa, neurotin, tramadol -prn xanax  Transfer to floor, She had a trip planned to [facility] tonight -I have asked her to cancel, Perhaps dc in 24h if remains stable  Cyril Mourning MD. FCCP. [facility] Pager [phone] If no response call [phone]    07/26/2013, 9:37 AM

## 2013-07-27 DIAGNOSIS — R0609 Other forms of dyspnea: Secondary | ICD-10-CM

## 2013-07-27 DIAGNOSIS — R0989 Other specified symptoms and signs involving the circulatory and respiratory systems: Secondary | ICD-10-CM

## 2013-07-27 LAB — GLUCOSE, CAPILLARY
Glucose-Capillary: 132 mg/dL — ABNORMAL HIGH (ref 70–99)
Glucose-Capillary: 143 mg/dL — ABNORMAL HIGH (ref 70–99)

## 2013-07-27 NOTE — Progress Notes (Signed)
PULMONARY / CRITICAL CARE MEDICINE   Name: Jenna Diaz MRN: [id number] DOB: 04-12-1946    ADMISSION DATE: 07/25/2013  CHIEF COMPLAINT:  Short of breath  BRIEF PATIENT DESCRIPTION:  68 yo never smoker admitted from pulmonary office with dyspnea and chest pain after undergoing PFT.  Followed by Dr. Craige CottaSood in pulmonary office.  SIGNIFICANT EVENTS: 3/27 Admit  STUDIES:   LINES / TUBES: PIV  CULTURES: None  ANTIBIOTICS: None  HISTORY OF PRESENT ILLNESS:   [age] yo female has trouble with dyspnea for years.  She is being evaluated by cardiology and pulmonary.  Concern has been for CAD, diastolic dysfunction, and deconditioning.  She present to pulmonary office for PFT.  After testing she was noted to have severe difficulty with her breathing.  She was anxious and dizzy.  She felt like she could not get air into her lungs.  She was also having mid sternal chest discomfort.  As a result decision made to proceed with hospital admission.  SUBJECTIVE: Dyspnea improved oob to chair Denies CP Afebrile, no cough  VITAL SIGNS: Temp:  [97.4 F (36.3 C)-97.6 F (36.4 C)] 97.4 F (36.3 C) (03/29 0532) Pulse Rate:  [54-59] 54 (03/29 0532) Resp:  [18] 18 (03/29 0532) BP: (129-144)/(73-77) 144/73 mmHg (03/29 0916) SpO2:  [97 %-99 %] 97 % (03/29 1147) Weight:  [270 lb 4.5 oz (122.6 kg)] 270 lb 4.5 oz (122.6 kg) (03/29 0532) INTAKE / OUTPUT: Intake/Output     03/28 0701 - 03/29 0700 03/29 0701 - 03/30 0700   P.O. 720    Total Intake(mL/kg) 720 (5.9)    Urine (mL/kg/hr)     Stool     Total Output       Net +720          Urine Occurrence 2 x    Stool Occurrence 2 x      PHYSICAL EXAMINATION: General:  Wd female in nad Neuro:  Alert,moves all extremities HEENT:  Nose without purulence or d/c noted, no LN/TMG Cardiovascular:  Regular, no murmur Lungs:  Totally clear to auscultation, +upper airway hoarseness. Abdomen:  Soft, nontender, colostomy in place Musculoskeletal:  No  edema  LABS:  CBC  Recent Labs Lab 07/25/13 2031  WBC 7.7  HGB 11.1*  HCT 33.5*  PLT 185   Coag's No results found for this basename: APTT, INR,  in the last 168 hours BMET  Recent Labs Lab 07/25/13 2031  NA 144  K 3.7  CL 106  CO2 26  BUN 9  CREATININE 1.12*  GLUCOSE 134*   Electrolytes  Recent Labs Lab 07/25/13 2031  CALCIUM 9.5   Sepsis Markers No results found for this basename: LATICACIDVEN, PROCALCITON, O2SATVEN,  in the last 168 hours ABG No results found for this basename: PHART, PCO2ART, PO2ART,  in the last 168 hours Liver Enzymes  Recent Labs Lab 07/25/13 2031  AST 24  ALT 24  ALKPHOS 73  BILITOT 0.4  ALBUMIN 3.1*   Cardiac Enzymes  Recent Labs Lab 07/25/13 2031 07/26/13 0217 07/26/13 0811  TROPONINI <0.30 <0.30 <0.30   Glucose  Recent Labs Lab 07/25/13 2212 07/26/13 0832 07/26/13 1202 07/26/13 1717 07/26/13 2126 07/27/13 0738  GLUCAP 108* 108* 171* 146* 94 132*       ASSESSMENT / PLAN:  A:  Acute respiratory distress likely related to vocal cord dysfunction-resolving PFTs nml ! P:- -continue bid protonix -add flonase, nasal irrigation -d/c albuterol   A: Chest pain with hx of CAD, hyperlipidemia, and HTN  -  resolved. P: - -defer cardiology assessment for now -continue ASA, pravachol, cozaar, amlodipine  A: Hx of OSA. P: -noct CPAP   A: Hx of DM, hypothyroidism. P: -SSI, amaryl, lantus -CHO modified diet -continue levothyroxine   A: Anxiety, chronic pain. P: -continue celexa, neurotin, tramadol -prn xanax  Anticipate d/c in am if hoarseness/voice strength better.  F/u can be arranged.

## 2013-07-27 NOTE — Progress Notes (Signed)
Patient states that she doesn't want to wear the CPAP.  RT will continue to monitor.

## 2013-07-27 NOTE — Progress Notes (Signed)
Patient's family member brought in patient's home CPAP unit and full face mask.  RT set up the CPAP and there are no frayed wires or cords. Patient is on auto titrate of 15 max and 5 min.  Patient's bilateral breath sounds are clear on auscultation and vitals are within normal limits. RT will continue to monitor.

## 2013-07-27 NOTE — Progress Notes (Signed)
Pt ambulating in the hall without O2, O2 sat-97-98%. Did become SOB

## 2013-07-28 LAB — GLUCOSE, CAPILLARY: Glucose-Capillary: 137 mg/dL — ABNORMAL HIGH (ref 70–99)

## 2013-07-28 MED ORDER — BENZONATATE 200 MG PO CAPS: 200.0000 mg | ORAL_CAPSULE | Freq: Three times a day (TID) | ORAL | Status: DC | PRN

## 2013-07-28 MED ORDER — ALPRAZOLAM 0.5 MG PO TABS: 0.5000 mg | ORAL_TABLET | Freq: Three times a day (TID) | ORAL | Status: DC | PRN

## 2013-07-28 MED ORDER — FLUTICASONE PROPIONATE 50 MCG/ACT NA SUSP: 2.0000 | Freq: Every day | NASAL | Status: AC

## 2013-07-28 MED ORDER — BENZONATATE 100 MG PO CAPS: 200.0000 mg | ORAL_CAPSULE | Freq: Three times a day (TID) | ORAL | Status: DC | PRN

## 2013-07-28 MED ORDER — CITALOPRAM HYDROBROMIDE 20 MG PO TABS: 20.0000 mg | ORAL_TABLET | Freq: Every day | ORAL | Status: DC

## 2013-07-28 MED ORDER — LORATADINE 10 MG PO TABS: 10.0000 mg | ORAL_TABLET | Freq: Every day | ORAL | Status: DC

## 2013-07-28 MED ORDER — LORATADINE 10 MG PO TABS
10.0000 mg | ORAL_TABLET | Freq: Every day | ORAL | Status: DC
  Filled 2013-07-28: qty 1

## 2013-07-28 NOTE — Discharge Summary (Signed)
Physician Discharge Summary  Patient ID: Jenna Diaz MRN: [id number] DOB/AGE: March 16, 1946 68 y.o.  Admit date: 07/25/2013 Discharge date: 07/28/2013  Problem List Active Problems:   Shortness of breath  HPI: 68 yo female has trouble with dyspnea for years. She is being evaluated by cardiology and pulmonary. Concern has been for CAD, diastolic dysfunction, and deconditioning. She present to pulmonary office for PFT. After testing she was noted to have severe difficulty with her breathing. She was anxious and dizzy. She felt like she could not get air into her lungs. She was also having mid sternal chest discomfort. As a result decision made to proceed with hospital admission.     Hospital Course: LINES / TUBES:  PIV  CULTURES:  None  ANTIBIOTICS:  None   ASSESSMENT / PLAN:  A:  Acute respiratory distress likely related to vocal cord dysfunction-resolving  PFTs nml !  P:-  -continue bid protonix  -add flonase, nasal irrigation  -d/c albuterol  A: Chest pain with hx of CAD, hyperlipidemia, and HTN -resolved.  P: -  -defer cardiology assessment for now  -continue ASA, pravachol, cozaar, amlodipine  A:  Hx of OSA.  P:  -noct CPAP  A:  Hx of DM, hypothyroidism.  P:  -SSI, amaryl, lantus  -CHO modified diet  -continue levothyroxine  A: Anxiety, chronic pain.  P:  -continue celexa, neurotin, tramadol  -prn xanax      Labs at discharge Lab Results  Component Value Date   CREATININE 1.12* 07/25/2013   BUN 9 07/25/2013   NA 144 07/25/2013   K 3.7 07/25/2013   CL 106 07/25/2013   CO2 26 07/25/2013   Lab Results  Component Value Date   WBC 7.7 07/25/2013   HGB 11.1* 07/25/2013   HCT 33.5* 07/25/2013   MCV 96.3 07/25/2013   PLT 185 07/25/2013   Lab Results  Component Value Date   ALT 24 07/25/2013   AST 24 07/25/2013   ALKPHOS 73 07/25/2013   BILITOT 0.4 07/25/2013   Lab Results  Component Value Date   INR 0.98 12/18/2010    Current radiology  studies EXAM:  PORTABLE CHEST - 1 VIEW  COMPARISON: 05/03/2013  FINDINGS:  Cardiac silhouette is normal in size and configuration. Normal  mediastinal and hilar contours. Clear lungs. No pleural effusion or  pneumothorax. The bony thorax is grossly intact.  IMPRESSION:  No active disease.  Electronically Signed  By: Amie Portlandavid Ormond M.D.  On: 07/25/2013 13:57  Disposition:  01-Home or Self Care  Discharge Orders   Future Appointments Provider Department Dept Phone   07/29/2013 8:00 AM [facility] [address] [phone]   08/05/2013 3:30 PM Julio Sicksammy S Parrett, NP [facility] [phone]   Future Orders Complete By Expires   Discharge patient  As directed        Medication List         acetaminophen 500 MG tablet  Commonly known as:  TYLENOL  Take 1,000 mg by mouth every 6 (six) hours as needed for mild pain.     albuterol 108 (90 BASE) MCG/ACT inhaler  Commonly known as:  PROAIR HFA  Inhale 2 puffs into the lungs every 6 (six) hours as needed for wheezing.     ALPRAZolam 0.5 MG tablet  Commonly known as:  XANAX  Take 1 tablet (0.5 mg total) by mouth every 8 (eight) hours as needed for anxiety.     amLODipine 5 MG tablet  Commonly known as:  NORVASC  Take 5 mg by mouth daily.     aspirin EC 81 MG tablet  Take 81 mg by mouth daily.     benzonatate 200 MG capsule  Commonly known as:  TESSALON  Take 1 capsule (200 mg total) by mouth 3 (three) times daily as needed for cough.     citalopram 20 MG tablet  Commonly known as:  CELEXA  Take 1 tablet (20 mg total) by mouth daily.     FEOSOL 200 (65 FE) MG Tabs  Generic drug:  Ferrous Sulfate Dried  Take 2 tablets by mouth every morning.     fluticasone 50 MCG/ACT nasal spray  Commonly known as:  FLONASE  Place 2 sprays into both nostrils daily.     furosemide 40 MG tablet  Commonly known as:  LASIX  Take 40 mg by mouth daily. For fluid retention      gabapentin 300 MG capsule  Commonly known as:  NEURONTIN  Take 300 mg by mouth 3 (three) times daily.     glimepiride 2 MG tablet  Commonly known as:  AMARYL  Take 2 mg by mouth daily before breakfast.     insulin glargine 100 UNIT/ML injection  Commonly known as:  LANTUS  Inject into the skin at bedtime. Take 40 units every morning and 30 units every evening     insulin lispro 100 UNIT/ML injection  Commonly known as:  HUMALOG  Inject 12 Units into the skin 2 (two) times daily.     levothyroxine 150 MCG tablet  Commonly known as:  SYNTHROID, LEVOTHROID  Take 150 mcg by mouth daily.     loratadine 10 MG tablet  Commonly known as:  CLARITIN  Take 1 tablet (10 mg total) by mouth daily.     losartan 50 MG tablet  Commonly known as:  COZAAR  Take 50 mg by mouth daily after breakfast.     multivitamin with minerals Tabs tablet  Take 1 tablet by mouth daily.     MUSCLE RUB 10-15 % Crea  Apply 1 application topically as needed (for pain in hands).     nitroGLYCERIN 0.4 MG SL tablet  Commonly known as:  NITROSTAT  Place 1 tablet (0.4 mg total) under the tongue every 5 (five) minutes as needed for chest pain.     pantoprazole 40 MG tablet  Commonly known as:  PROTONIX  Take 40 mg by mouth 2 (two) times daily.     potassium chloride 10 MEQ tablet  Commonly known as:  K-DUR,KLOR-CON  Take 1 tablet by mouth daily.     pravastatin 80 MG tablet  Commonly known as:  PRAVACHOL  Take 80 mg by mouth at bedtime.     traMADol 50 MG tablet  Commonly known as:  ULTRAM  Take 50 mg by mouth 3 (three) times daily.           Follow-up Information   Follow up with PARRETT,TAMMY, NP On 08/05/2013. (NP PArrett 3:30 pm)    Specialty:  Nurse Practitioner   Contact information:   [address] [place] [address] [phone]        Discharged Condition: fair  Time spent on discharge greater than 40 minutes.  Vital signs at Discharge. Temp:  [97.6 F (36.4 C)-97.9 F  (36.6 C)] 97.6 F (36.4 C) (03/30 0750) Pulse Rate:  [54-60] 60 (03/30 0750) Resp:  [16-20] 16 (03/30 0750) BP: (133-158)/(64-77) 158/77 mmHg (03/30 1013) SpO2:  [97 %-  99 %] 99 % (03/30 0750) Weight:  [122.8 kg (270 lb 11.6 oz)] 122.8 kg (270 lb 11.6 oz) (03/30 0442) Office follow up Special Information or instructions.  She has a follow up with Tammy Parret ANP-BC scheduled. Note she has Xanax and Celexa added to her pharmaceutical regime this admit.  Signed: Brett Canales Minor ACNP [facility] Pager [phone] till 3 pm If no answer page [phone]  Independently examined pt, evaluated data & formulated above discharge care plan with NP who scribed this note & edited by me.   ALVA,RAKESH V.  07/28/2013, 10:46 AM

## 2013-07-28 NOTE — Progress Notes (Signed)
Can dc today Add flonase, claritin & tessalon perles 200 tid prn cough  to her home meds.  Jenna Diaz V.  [phone]

## 2013-07-28 NOTE — Progress Notes (Signed)
Discharge instructions explained to pt and prescriptions given to pt. Discharge to family.

## 2013-07-29 ENCOUNTER — Encounter (HOSPITAL_COMMUNITY): Payer: Medicare Other

## 2013-07-29 ENCOUNTER — Encounter: Payer: Self-pay | Admitting: Pulmonary Disease

## 2013-07-29 LAB — GLUCOSE, CAPILLARY
Glucose-Capillary: 164 mg/dL — ABNORMAL HIGH (ref 70–99)
Glucose-Capillary: 167 mg/dL — ABNORMAL HIGH (ref 70–99)

## 2013-08-05 ENCOUNTER — Inpatient Hospital Stay: Payer: Medicare Other | Admitting: Adult Health

## 2013-08-07 ENCOUNTER — Ambulatory Visit (INDEPENDENT_AMBULATORY_CARE_PROVIDER_SITE_OTHER): Payer: Medicare Other | Admitting: Adult Health

## 2013-08-07 ENCOUNTER — Encounter: Payer: Self-pay | Admitting: Adult Health

## 2013-08-07 VITALS — BP 106/60 | HR 83 | Temp 98.6°F | Ht 67.0 in | Wt 274.6 lb

## 2013-08-07 DIAGNOSIS — G4730 Sleep apnea, unspecified: Secondary | ICD-10-CM

## 2013-08-07 DIAGNOSIS — R0600 Dyspnea, unspecified: Secondary | ICD-10-CM

## 2013-08-07 DIAGNOSIS — R0989 Other specified symptoms and signs involving the circulatory and respiratory systems: Secondary | ICD-10-CM

## 2013-08-07 DIAGNOSIS — G4731 Primary central sleep apnea: Secondary | ICD-10-CM

## 2013-08-07 DIAGNOSIS — G4739 Other sleep apnea: Secondary | ICD-10-CM

## 2013-08-07 DIAGNOSIS — R0609 Other forms of dyspnea: Secondary | ICD-10-CM

## 2013-08-07 NOTE — Patient Instructions (Signed)
follow up for Stress test next week as planned Continue with CPAP At bedtime   follow up Dr. Craige CottaSood  In 3 weeks and As needed

## 2013-08-08 NOTE — Progress Notes (Signed)
Reviewed and agree with assessment/plan. 

## 2013-08-08 NOTE — Assessment & Plan Note (Signed)
Cont on CPAP  Wt loss as able    follow up Dr. Craige CottaSood  In 3 weeks and As needed

## 2013-08-08 NOTE — Assessment & Plan Note (Signed)
Unclear what is contributing to her dyspnea.  No airflow obstruction on PFT , diffusing capacity is nml , no sign of reversibility w/ BD  Advised to go for Stress test to r/o possible card source.

## 2013-08-08 NOTE — Progress Notes (Signed)
   Subjective:    Patient ID: Jenna Diaz, female    DOB: Feb 11, 1946, 68 y.o.   MRN: [id number]  HPI 68 yo never smoker with complex sleep apnea and dyspnea.  Tests: CT chest 03/16/11>>RML GGO, LLL GGO, ascending thoracic aorta 4.4 cm, mild CM  PFT 01/26/11>>FEV1 2.35(101%), FEV1% 86, TLC 2.41(111%), DLCO 33%, no BD CXR 06/13/11>>no acute disease PSG 01/08/12>>AHI 32.5, SpO2 86%. Complex sleep apnea. CPAP 03/01/12>>CPAP 12 cm H2O>>AHI 3.9, +R, +S. 06/19/12 Change to auto CPAP 5 to 15 cm H2O CPAP 12/16/12 to 06/13/13 >> Used on 80 of 100 nights with average 5 hr 6 min.  Average AHI 2.6 with CPAP 12 cm H2O.  PFT 07/25/13 >> FEV1 2.46 (110%), FEV1% 82, TLC 5.33 (96%), DLCO 92%, no BD  08/07/13 Post Hospital follow up  Patient returns for a post hospital followup. Patient was admitted March 27 through March 30 for shortness, of breath, and chest pain. Workup was unrevealing. Chest x-ray without acute process noted. Pulmonary function test was normal, without airflow obstruction. Patient was felt to have a possible vocal cord dysfunction. Cardiac enzymes were negative. Patient has been evaluated by cardiology and plan for a stress test. Next week. Patient was started on Celexa, and Xanax during her hospitalization to help with anxiety issues. Reports SOB is some improved but still present with exertion.  denies any wheezing, tightness, cough, f/c/s.   Review of Systems Constitutional:   No  weight loss, night sweats,  Fevers, chills,  +fatigue, or  lassitude.  HEENT:   No headaches,  Difficulty swallowing,  Tooth/dental problems, or  Sore throat,                No sneezing, itching, ear ache, nasal congestion, post nasal drip,   CV:  No chest pain,  Orthopnea, PND, swelling in lower extremities, anasarca, dizziness, palpitations, syncope.   GI  No heartburn, indigestion, abdominal pain, nausea, vomiting, diarrhea, change in bowel habits, loss of appetite, bloody stools.   Resp: No  excess mucus, no productive cough,  No non-productive cough,  No coughing up of blood.  No change in color of mucus.  No wheezing.  No chest wall deformity  Skin: no rash or lesions.  GU: no dysuria, change in color of urine, no urgency or frequency.  No flank pain, no hematuria   MS:  No joint pain or swelling.  No decreased range of motion.  No back pain.  Psych:  No change in mood or affect         Objective:   Physical Exam GEN: A/Ox3; pleasant , NAD  HEENT:  [place]/AT,  EACs-clear, TMs-wnl, NOSE-clear, THROAT-clear, no lesions, no postnasal drip or exudate noted.   NECK:  Supple w/ fair ROM; no JVD; normal carotid impulses w/o bruits; no thyromegaly or nodules palpated; no lymphadenopathy.  RESP  Clear  P & A; w/o, wheezes/ rales/ or rhonchi.no accessory muscle use, no dullness to percussion  CARD:  RRR, no m/r/g  , no peripheral edema, pulses intact, no cyanosis or clubbing.  GI:   Soft & nt; nml bowel sounds; no organomegaly or masses detected.  Musco: Warm bil, no deformities or joint swelling noted.   Neuro: alert, no focal deficits noted.    Skin: Warm, no lesions or rashes         Assessment & Plan:

## 2013-08-13 ENCOUNTER — Ambulatory Visit (HOSPITAL_COMMUNITY): Payer: Medicare Other | Attending: Cardiology | Admitting: Radiology

## 2013-08-13 ENCOUNTER — Encounter (HOSPITAL_COMMUNITY): Payer: Medicare Other

## 2013-08-13 VITALS — BP 130/69 | Ht 68.0 in | Wt 270.0 lb

## 2013-08-13 DIAGNOSIS — R002 Palpitations: Secondary | ICD-10-CM | POA: Insufficient documentation

## 2013-08-13 DIAGNOSIS — Z8249 Family history of ischemic heart disease and other diseases of the circulatory system: Secondary | ICD-10-CM | POA: Insufficient documentation

## 2013-08-13 DIAGNOSIS — I10 Essential (primary) hypertension: Secondary | ICD-10-CM | POA: Insufficient documentation

## 2013-08-13 DIAGNOSIS — R0609 Other forms of dyspnea: Secondary | ICD-10-CM

## 2013-08-13 DIAGNOSIS — I2510 Atherosclerotic heart disease of native coronary artery without angina pectoris: Secondary | ICD-10-CM

## 2013-08-13 DIAGNOSIS — R079 Chest pain, unspecified: Secondary | ICD-10-CM

## 2013-08-13 DIAGNOSIS — E119 Type 2 diabetes mellitus without complications: Secondary | ICD-10-CM | POA: Insufficient documentation

## 2013-08-13 DIAGNOSIS — Z794 Long term (current) use of insulin: Secondary | ICD-10-CM | POA: Insufficient documentation

## 2013-08-13 DIAGNOSIS — R0602 Shortness of breath: Secondary | ICD-10-CM

## 2013-08-13 DIAGNOSIS — R0989 Other specified symptoms and signs involving the circulatory and respiratory systems: Secondary | ICD-10-CM | POA: Insufficient documentation

## 2013-08-13 DIAGNOSIS — E785 Hyperlipidemia, unspecified: Secondary | ICD-10-CM | POA: Insufficient documentation

## 2013-08-13 DIAGNOSIS — R0600 Dyspnea, unspecified: Secondary | ICD-10-CM

## 2013-08-13 MED ORDER — REGADENOSON 0.4 MG/5ML IV SOLN
0.4000 mg | Freq: Once | INTRAVENOUS | Status: AC
  Administered 2013-08-13: 0.4 mg via INTRAVENOUS

## 2013-08-13 MED ORDER — TECHNETIUM TC 99M SESTAMIBI GENERIC - CARDIOLITE
33.0000 | Freq: Once | INTRAVENOUS | Status: AC | PRN
  Administered 2013-08-13: 33 via INTRAVENOUS

## 2013-08-13 MED ORDER — AMINOPHYLLINE 25 MG/ML IV SOLN
75.0000 mg | Freq: Once | INTRAVENOUS | Status: AC
  Administered 2013-08-13: 75 mg via INTRAVENOUS

## 2013-08-13 NOTE — Progress Notes (Signed)
[facility] S[facility] N[facility] [address] [place], [place] [id number] [phone]    Cardiology Nuclear Med Study  Jenna Diaz is a 68 y.o. female     MRN : [id number]     DOB: [birth year]  Procedure Date: 08/13/2013  Nuclear Med Background Indication for Stress Test:  Evaluation for Ischemia, and Patient seen in hospital on 07-25-13 for SOB, enzymes negative History:  '12 MPI: EF: 64% apical ant septal scar,CAD,, CATH-STENT-LAD Cardiac Risk Factors: Family History - CAD, Hypertension, IDDM, and Lipids  Symptoms:  Chest Pain, DOE and Palpitations   Nuclear Pre-Procedure Caffeine/Decaff Intake:  None > 12 hrs NPO After: 6:00pm   Lungs:  clear O2 Sat: 95% on room air. IV 0.9% NS with Angio Cath:  22g  IV Site: R Antecubital x 1, tolerated well IV Started by:  Irean HongPatsy Edwards, RN  Chest Size (in):  44 Cup Size: B  Height: 5\' 8"  (1.727 m)  Weight:  270 lb (122.471 kg)  BMI:  Body mass index is 41.06 kg/(m^2). Tech Comments:  Full dose insulin at 6:00 pm yesterday, no insulin today. Fasting CBG was 87 at 0700 today.    Nuclear Med Study 1 or 2 day study: 1 day  Stress Test Type:  Eugenie BirksLexiscan  Reading MD: N/A  Order Authorizing Provider:  Rollene RotundaJames Hochrein, MD  Resting Radionuclide: Technetium 706m Sestamibi  Resting Radionuclide Dose: 33.0 mCi  On      08-18-13  Stress Radionuclide:  Technetium 316m Sestamibi  Stress Radionuclide Dose: 33.0 mCi   On        08-13-13          Stress Protocol Rest HR: 51 Stress HR: 68  Rest BP: 130/69 Stress BP: 141/63  Exercise Time (min): n/a METS: n/a   Predicted Max HR: 155 bpm % Max HR: 43.87 bpm Rate Pressure Product: 9588   Dose of Adenosine (mg):  n/a Dose of Lexiscan: 0.4 mg  Dose of Atropine (mg): n/a Dose of Dobutamine: n/a mcg/kg/min (at max HR)  Stress Test Technologist: Milana NaSabrina Williams, EMT-P  Nuclear Technologist:  Domenic PoliteStephen Carbone, CNMT     Rest Procedure:  Myocardial perfusion imaging was performed at rest 45  minutes following the intravenous administration of Technetium 726m Sestamibi. Rest ECG: Sinus bradycardia  50 bpm    Stress Procedure:  The patient received IV Lexiscan 0.4 mg over 15-seconds.  Technetium 846m Sestamibi injected at 30-seconds. This patient had sob, head and chest felt funny, and chest pain with the Lexiscan injection. Quantitative spect images were obtained after a 45 minute delay. Stress ECG: No significant change from baseline ECG  QPS Raw Data Images:  Soft tissue (daphragm, breast) surround heart.   Stress Images: Minimal thinning in the distal anteroseptal wall  Otherwise normal perfusion.   Rest Images:  Comparison with the stress images reveals no significant change. Subtraction (SDS):  No evidence of ischemia. Transient Ischemic Dilatation (Normal <1.22):  0.88 Lung/Heart Ratio (Normal <0.45):  0.39  Quantitative Gated Spect Images QGS EDV:  98 ml QGS ESV:  26 ml  Impression Exercise Capacity:  Lexiscan with no exercise. BP Response:  Normal blood pressure response. Clinical Symptoms:  Mild chest pain/dyspnea. ECG Impression:  No significant ST segment change suggestive of ischemia. Comparison with Prior Nuclear Study: Similar defect described, probable normal perfusion/soft tissue attenuation.   Overall Impression:  PRobable normal perfusion and minimal soft tissue attenuation (breast)  No significant ischemia or scar.    LV Ejection  Fraction: 74%.  LV Wall Motion:  NL LV Function; NL Wall Motion  Jenna Diaz

## 2013-08-18 ENCOUNTER — Encounter: Payer: Self-pay | Admitting: Cardiology

## 2013-08-18 ENCOUNTER — Ambulatory Visit (HOSPITAL_COMMUNITY): Payer: Medicare Other | Attending: Cardiology

## 2013-08-18 DIAGNOSIS — R0989 Other specified symptoms and signs involving the circulatory and respiratory systems: Secondary | ICD-10-CM

## 2013-08-18 DIAGNOSIS — R079 Chest pain, unspecified: Secondary | ICD-10-CM

## 2013-08-18 MED ORDER — TECHNETIUM TC 99M SESTAMIBI GENERIC - CARDIOLITE
33.0000 | Freq: Once | INTRAVENOUS | Status: AC | PRN
  Administered 2013-08-18: 33 via INTRAVENOUS

## 2013-09-03 ENCOUNTER — Encounter: Payer: Self-pay | Admitting: Pulmonary Disease

## 2013-09-03 ENCOUNTER — Ambulatory Visit (INDEPENDENT_AMBULATORY_CARE_PROVIDER_SITE_OTHER): Payer: Medicare Other | Admitting: Pulmonary Disease

## 2013-09-03 VITALS — BP 140/80 | HR 52 | Ht 67.0 in | Wt 278.0 lb

## 2013-09-03 DIAGNOSIS — G4730 Sleep apnea, unspecified: Secondary | ICD-10-CM

## 2013-09-03 DIAGNOSIS — R0609 Other forms of dyspnea: Secondary | ICD-10-CM

## 2013-09-03 DIAGNOSIS — G4731 Primary central sleep apnea: Secondary | ICD-10-CM

## 2013-09-03 DIAGNOSIS — R0600 Dyspnea, unspecified: Secondary | ICD-10-CM

## 2013-09-03 DIAGNOSIS — R0989 Other specified symptoms and signs involving the circulatory and respiratory systems: Secondary | ICD-10-CM

## 2013-09-03 MED ORDER — ALBUTEROL SULFATE HFA 108 (90 BASE) MCG/ACT IN AERS: 2.0000 | INHALATION_SPRAY | Freq: Four times a day (QID) | RESPIRATORY_TRACT | Status: DC | PRN

## 2013-09-03 NOTE — Progress Notes (Signed)
Chief Complaint  Patient presents with  . Shortness of Breath    Breathing is unchanged. Still reports coughing, SOB and chest tightness. Denies wheezing.    History of Present Illness: Jenna Diaz is a 68 y.o. female never smoker with complex sleep apnea and dyspnea >> likely for vocal cord dysfunction, upper airway cough syndrome, and deconditioning.  She has been doing better.  She gets occasional cough and tight feeling in upper chest.  She will sometimes wake up with phlegm in her throat.  She is using CPAP at night, and this helps.  She has a full face mask.    She is using flonase.  She is not needing proair much.  Tests: CT chest 03/16/11>>RML GGO, LLL GGO, ascending thoracic aorta 4.4 cm, mild CM  PFT 01/26/11>>FEV1 2.35(101%), FEV1% 86, TLC 2.41(111%), DLCO 33%, no BD PSG 01/08/12>>AHI 32.5, SpO2 86%. Complex sleep apnea. CPAP 03/01/12>>CPAP 12 cm H2O>>AHI 3.9, +R, +S. 06/19/12 Change to auto CPAP 5 to 15 cm H2O CPAP 12/16/12 to 06/13/13 >> Used on 80 of 100 nights with average 5 hr 6 min.  Average AHI 2.6 with CPAP 12 cm H2O. PFT 07/25/13 >> FEV1 2.46 (110%), FEV1% 82, TLC 5.33 (96%), DLCO 92%  She  has a past medical history of Diabetes mellitus; Essential hypertension; Hypothyroidism; Anemia; GERD (gastroesophageal reflux disease); Hyperlipemia; Bowel obstruction (1997); Osteoarthritis; Chronic kidney disease; Gastric ulcer; CAD (coronary artery disease); and Shortness of breath.  She  has past surgical history that includes Colon resection (1990's); Hand surgery; Nissen fundoplication; Gastrostomy w/ feeding tube (1990's); Colon surgery; Abdominal hysterectomy (1970's); and Dilation and curettage of uterus.   Current Outpatient Prescriptions on File Prior to Visit  Medication Sig Dispense Refill  . acetaminophen (TYLENOL) 500 MG tablet Take 1,000 mg by mouth every 6 (six) hours as needed for mild pain.       Marland Kitchen. albuterol (PROAIR HFA) 108 (90 BASE) MCG/ACT inhaler  Inhale 2 puffs into the lungs every 6 (six) hours as needed for wheezing.  1 Inhaler  5  . amLODipine (NORVASC) 5 MG tablet Take 5 mg by mouth daily.      Marland Kitchen. aspirin EC 81 MG tablet Take 81 mg by mouth daily.      . Ferrous Sulfate Dried (FEOSOL) 200 (65 FE) MG TABS Take 2 tablets by mouth every morning.      . fluticasone (FLONASE) 50 MCG/ACT nasal spray Place 2 sprays into both nostrils daily.  1 g  2  . furosemide (LASIX) 40 MG tablet Take 40 mg by mouth daily. For fluid retention      . gabapentin (NEURONTIN) 300 MG capsule Take 300 mg by mouth 3 (three) times daily.       Marland Kitchen. glimepiride (AMARYL) 2 MG tablet Take 2 mg by mouth daily before breakfast.      . insulin glargine (LANTUS) 100 UNIT/ML injection Inject into the skin at bedtime. Take 40 units every morning and 30 units every evening      . insulin lispro (HUMALOG) 100 UNIT/ML injection Inject 12 Units into the skin 2 (two) times daily.      Marland Kitchen. levothyroxine (SYNTHROID, LEVOTHROID) 150 MCG tablet Take 150 mcg by mouth daily.        Marland Kitchen. losartan (COZAAR) 50 MG tablet Take 50 mg by mouth daily after breakfast.      . Menthol-Methyl Salicylate (MUSCLE RUB) 10-15 % CREA Apply 1 application topically as needed (for pain in hands).      .Marland Kitchen  Multiple Vitamin (MULTIVITAMIN WITH MINERALS) TABS tablet Take 1 tablet by mouth daily.      . nitroGLYCERIN (NITROSTAT) 0.4 MG SL tablet Place 1 tablet (0.4 mg total) under the tongue every 5 (five) minutes as needed for chest pain.  25 tablet  3  . pantoprazole (PROTONIX) 40 MG tablet Take 40 mg by mouth 2 (two) times daily.       . potassium chloride (K-DUR,KLOR-CON) 10 MEQ tablet Take 1 tablet by mouth daily.      . pravastatin (PRAVACHOL) 80 MG tablet Take 80 mg by mouth at bedtime.      . traMADol (ULTRAM) 50 MG tablet Take 50 mg by mouth 3 (three) times daily.         No current facility-administered medications on file prior to visit.   No Known Allergies   Physical Exam:  General - Obese HEENT -  no sinus tenderness, no oral exudate, no LAN, MP 3, 2+ tonsils, wheeze in throat Cardiac - s1s2 regular, no murmur Chest - no wheeze/rales/dullness Abdomen - soft, colostomy RLQ Extremities - no edema, no cyanosis/clubbing Neurologic - normal strength, CN intact Skin - no rashes Psychiatric - normal mood, behavior   Assessment/Plan: Coralyn HellingVineet Caroll Cunnington, MD [facility] 09/03/2013, 11:07 AM Pager:  [phone] After 3pm call: [phone]

## 2013-09-03 NOTE — Patient Instructions (Signed)
Follow up in 6 months 

## 2013-09-03 NOTE — Assessment & Plan Note (Signed)
Likely related to deconditioning and vocal cord dysfunction.

## 2013-09-03 NOTE — Assessment & Plan Note (Signed)
Continue CPAP 12 cm H2O.

## 2013-09-04 ENCOUNTER — Encounter (HOSPITAL_COMMUNITY): Payer: Medicare Other

## 2013-11-29 LAB — PULMONARY FUNCTION TEST
DL/VA % pred: 102 %
DL/VA: 5.29 ml/min/mmHg/L
DLCO UNC % PRED: 92 %
DLCO UNC: 26.3 ml/min/mmHg
FEF 25-75 POST: 2.55 L/s
FEF 25-75 PRE: 2.65 L/s
FEF2575-%Change-Post: -3 %
FEF2575-%PRED-POST: 122 %
FEF2575-%PRED-PRE: 126 %
FEV1-%Change-Post: -1 %
FEV1-%Pred-Post: 109 %
FEV1-%Pred-Pre: 110 %
FEV1-Post: 2.43 L
FEV1-Pre: 2.46 L
FEV1FVC-%Change-Post: 4 %
FEV1FVC-%PRED-PRE: 105 %
FEV6-%CHANGE-POST: -5 %
FEV6-%PRED-POST: 102 %
FEV6-%PRED-PRE: 107 %
FEV6-POST: 2.82 L
FEV6-Pre: 2.98 L
FEV6FVC-%CHANGE-POST: 0 %
FEV6FVC-%PRED-POST: 103 %
FEV6FVC-%Pred-Pre: 102 %
FVC-%CHANGE-POST: -5 %
FVC-%PRED-POST: 98 %
FVC-%PRED-PRE: 104 %
FVC-PRE: 2.99 L
FVC-Post: 2.82 L
PRE FEV1/FVC RATIO: 82 %
Post FEV1/FVC ratio: 86 %
Post FEV6/FVC ratio: 100 %
Pre FEV6/FVC Ratio: 99 %
RV % pred: 91 %
RV: 2.06 L
TLC % pred: 96 %
TLC: 5.33 L

## 2014-01-19 ENCOUNTER — Other Ambulatory Visit: Payer: Self-pay | Admitting: Cardiology

## 2014-02-09 ENCOUNTER — Other Ambulatory Visit: Payer: Self-pay | Admitting: *Deleted

## 2014-02-09 DIAGNOSIS — R200 Anesthesia of skin: Secondary | ICD-10-CM

## 2014-03-05 ENCOUNTER — Ambulatory Visit (INDEPENDENT_AMBULATORY_CARE_PROVIDER_SITE_OTHER): Payer: Medicare Other | Admitting: Neurology

## 2014-03-05 DIAGNOSIS — M79601 Pain in right arm: Secondary | ICD-10-CM

## 2014-03-05 DIAGNOSIS — M79602 Pain in left arm: Principal | ICD-10-CM

## 2014-03-05 DIAGNOSIS — R200 Anesthesia of skin: Secondary | ICD-10-CM

## 2014-03-05 DIAGNOSIS — R202 Paresthesia of skin: Principal | ICD-10-CM

## 2014-03-05 DIAGNOSIS — M79643 Pain in unspecified hand: Secondary | ICD-10-CM

## 2014-03-05 NOTE — Procedures (Signed)
[facility]  [address], [address]  [place], [place] [address] Tel: [phone] Fax:  [phone] Test Date:  03/05/2014  Patient: Jenna Diaz DOB: August 03, 1945 Physician: Nita Sickleonika Patel  Sex: Female Height: 5\' 6"  Ref Phys: Franchot MimesShah, A.  ID#: [id number] Temp: 34.0C Technician: Ala BentSusan Reid R. NCS T.   Patient Complaints: Patient is a 68 year old female here for evaluation of numbness and tingling of both hands left worse than right and frequently drops things.  NCV & EMG Findings: Extensive electrodiagnostic testing of the right upper extremity and additional studies of the left reveals:  1. Bilateral median, ulnar, radial, and palmar sensory responses are within normal limits. 2. Bilateral median and ulnar motor responses are within normal limits. 3. There is no evidence of active or chronic motor axon loss changes affecting any of the tested muscles.  Impression: This is a normal electrodiagnostic study of the upper extremities.   In particular, there is no evidence of carpal tunnel syndrome or cervical radiculopathy affecting the upper extremities.   ___________________________ Nita Sickleonika Patel    Nerve Conduction Studies Anti Sensory Summary Table   Stim Site NR Peak (ms) Norm Peak (ms) P-T Amp (V) Norm P-T Amp  Left Median Anti Sensory (2nd Digit)  Wrist    3.4 <3.8 15.6 >10  Right Median Anti Sensory (2nd Digit)  Wrist    3.8 <3.8 14.5 >10  Left Radial Anti Sensory (Base 1st Digit)  Wrist    2.6 <2.8 15.6 >10  Right Radial Anti Sensory (Base 1st Digit)  Wrist    2.4 <2.8 16.2 >10  Left Ulnar Anti Sensory (5th Digit)  Wrist    3.1 <3.2 7.3 >5  Right Ulnar Anti Sensory (5th Digit)  Wrist    2.8 <3.2 9.8 >5   Motor Summary Table   Stim Site NR Onset (ms) Norm Onset (ms) O-P Amp (mV) Norm O-P Amp Site1 Site2 Delta-0 (ms) Dist (cm) Vel (m/s) Norm Vel (m/s)  Left Median Motor (Abd Poll Brev)  Wrist    3.3 <4.0 7.0 >5 Elbow Wrist 5.5 28.0 51 >50  Elbow     8.8  6.2         Right Median Motor (Abd Poll Brev)  Wrist    4.0 <4.0 7.5 >5 Elbow Wrist 5.0 28.0 56 >50  Elbow    9.0  6.8         Left Ulnar Motor (Abd Dig Minimi)  35C  Wrist    2.6 <3.1 8.6 >7 B Elbow Wrist 4.3 24.0 56 >50  B Elbow    6.9  7.3  A Elbow B Elbow 1.9 10.0 53 >50  A Elbow    8.8  7.1         Right Ulnar Motor (Abd Dig Minimi)  Wrist    2.6 <3.1 8.3 >7 B Elbow Wrist 4.0 23.0 58 >50  B Elbow    6.6  8.2  A Elbow B Elbow 1.8 10.0 56 >50  A Elbow    8.4  8.1          Comparison Summary Table   Stim Site NR Peak (ms) Norm Peak (ms) P-T Amp (V) Site1 Site2 Delta-P (ms) Norm Delta (ms)  Left Median/Ulnar Palm Comparison (Wrist - 8cm)  Median Palm    2.1 <2.2 19.2 Median Palm Ulnar Palm 0.1   Ulnar Palm    2.0 <2.2 5.1      Right Median/Ulnar Palm Comparison (Wrist - 8cm)  35C  Median Palm    2.1 <2.2 15.0 Median Palm Ulnar Palm 0.1   Ulnar Palm    2.0 <2.2 7.6       EMG   Side Muscle Ins Act Fibs Psw Fasc Number Recrt Dur Dur. Amp Amp. Poly Poly. Comment  Right 1stDorInt Nml Nml Nml Nml Nml Nml Nml Nml Nml Nml Nml Nml N/A  Right Ext Indicis Nml Nml Nml Nml Nml Nml Nml Nml Nml Nml Nml Nml N/A  Right PronatorTeres Nml Nml Nml Nml Nml Nml Nml Nml Nml Nml Nml Nml N/A  Right Biceps Nml Nml Nml Nml Nml Nml Nml Nml Nml Nml Nml Nml N/A  Right Triceps Nml Nml Nml Nml Nml Nml Nml Nml Nml Nml Nml Nml N/A  Right Deltoid Nml Nml Nml Nml Nml Nml Nml Nml Nml Nml Nml Nml N/A  Left 1stDorInt Nml Nml Nml Nml Nml Nml Nml Nml Nml Nml Nml Nml N/A  Left Ext Indicis Nml Nml Nml Nml Nml Nml Nml Nml Nml Nml Nml Nml N/A  Left PronatorTeres Nml Nml Nml Nml Nml Nml Nml Nml Nml Nml Nml Nml N/A  Left Biceps Nml Nml Nml Nml Nml Nml Nml Nml Nml Nml Nml Nml N/A  Left Triceps Nml Nml Nml Nml Nml Nml Nml Nml Nml Nml Nml Nml N/A  Left Deltoid Nml Nml Nml Nml Nml Nml Nml Nml Nml Nml Nml Nml N/A      Waveforms:

## 2014-04-09 ENCOUNTER — Encounter (HOSPITAL_COMMUNITY): Payer: Self-pay | Admitting: Cardiovascular Disease

## 2014-04-10 ENCOUNTER — Ambulatory Visit (INDEPENDENT_AMBULATORY_CARE_PROVIDER_SITE_OTHER): Payer: Medicare Other | Admitting: Pulmonary Disease

## 2014-04-10 ENCOUNTER — Encounter: Payer: Self-pay | Admitting: Pulmonary Disease

## 2014-04-10 VITALS — BP 130/62 | HR 55 | Ht 66.0 in | Wt 259.0 lb

## 2014-04-10 DIAGNOSIS — G4737 Central sleep apnea in conditions classified elsewhere: Secondary | ICD-10-CM

## 2014-04-10 DIAGNOSIS — G4733 Obstructive sleep apnea (adult) (pediatric): Secondary | ICD-10-CM

## 2014-04-10 DIAGNOSIS — R0600 Dyspnea, unspecified: Secondary | ICD-10-CM

## 2014-04-10 DIAGNOSIS — G4731 Primary central sleep apnea: Secondary | ICD-10-CM

## 2014-04-10 NOTE — Progress Notes (Signed)
Chief Complaint  Patient presents with  . Follow-up    patient has some sob with exhertion. She coughs with lots of talking. Patient denies wheezing and or chest tightness. Pt wears CPAP every other night between 4-8 hrs.    History of Present Illness: Jenna Diaz is a 68 y.o. female never smoker with complex sleep apnea and dyspnea >> likely for vocal cord dysfunction, upper airway cough syndrome, and deconditioning.  She gets winded if she talks too much or over exerts.  She does okay an a steady pace.  She gets occasional cough.  She uses her albuterol several times per week and this helps.  She is using nose spray and this has helped a lot.  She gets about 6 to 7 hours sleep and uses CPAP every night.  This helps.  Tests: CT chest 03/16/11>>RML GGO, LLL GGO, ascending thoracic aorta 4.4 cm, mild CM  PFT 01/26/11>>FEV1 2.35(101%), FEV1% 86, TLC 2.41(111%), DLCO 33%, no BD PSG 01/08/12>>AHI 32.5, SpO2 86%. Complex sleep apnea. CPAP 03/01/12>>CPAP 12 cm H2O>>AHI 3.9, +R, +S. 06/19/12 Change to auto CPAP 5 to 15 cm H2O CPAP 12/16/12 to 06/13/13 >> Used on 80 of 100 nights with average 5 hr 6 min.  Average AHI 2.6 with CPAP 12 cm H2O. PFT 07/25/13 >> FEV1 2.46 (110%), FEV1% 82, TLC 5.33 (96%), DLCO 92%  PMHx >> DM, HTN, HLD, , CAD, Hypothyroidism, GERD, CKD  PSHx, Medications, Allergies, Fhx, Shx reviewed.   Physical Exam: Blood pressure 130/62, pulse 55, height 5\' 6"  (1.676 m), weight 259 lb (117.482 kg), SpO2 98 %. Body mass index is 41.82 kg/(m^2).  General - Obese HEENT - no sinus tenderness, no oral exudate, no LAN, MP 3, 2+ tonsils, wheeze in throat Cardiac - s1s2 regular, no murmur Chest - no wheeze/rales/dullness Abdomen - soft, colostomy RLQ Extremities - no edema, no cyanosis/clubbing Neurologic - normal strength, CN intact Skin - no rashes Psychiatric - normal mood, behavior   Assessment/Plan:  Dyspnea 2nd to deconditioning. Plan: - encouraged her to  maintain a regular exercise regimen  Upper airway cough syndrome. Plan: - continue flonase - continue prn albuterol  Complex sleep apnea >> she is compliant with CPAP and reports benefit.. Plan: - continue CPAP 12 cm H2O  Coralyn HellingVineet Aline Wesche, MD [facility] 04/10/2014, 2:51 PM Pager:  [phone] After 3pm call: [phone]

## 2014-04-10 NOTE — Patient Instructions (Signed)
Follow up in 6 months 

## 2014-07-29 ENCOUNTER — Encounter: Payer: Self-pay | Admitting: Cardiology

## 2014-07-29 ENCOUNTER — Ambulatory Visit (INDEPENDENT_AMBULATORY_CARE_PROVIDER_SITE_OTHER): Payer: Medicare Other | Admitting: Cardiology

## 2014-07-29 VITALS — BP 102/60 | HR 65 | Ht 67.0 in | Wt 280.0 lb

## 2014-07-29 DIAGNOSIS — I2510 Atherosclerotic heart disease of native coronary artery without angina pectoris: Secondary | ICD-10-CM | POA: Diagnosis not present

## 2014-07-29 DIAGNOSIS — I10 Essential (primary) hypertension: Secondary | ICD-10-CM | POA: Diagnosis not present

## 2014-07-29 DIAGNOSIS — I200 Unstable angina: Secondary | ICD-10-CM

## 2014-07-29 NOTE — Patient Instructions (Signed)
The current medical regimen is effective;  continue present plan and medications.  Follow up in 1 year with Dr. Hochrein.  You will receive a letter in the mail 2 months before you are due.  Please call us when you receive this letter to schedule your follow up appointment.  Thank you for choosing [facility]!!     

## 2014-07-29 NOTE — Progress Notes (Signed)
HPI This is a 69 year old female who is here today for a followup of CAD. Work up has included right and left cardiac catheterization which demonstrated normal filling pressures and no evidence of pulmonary hypertension. There was a 60-70% mid LAD stenosis which was significant by FFR. The stenosis in the proximal RCA was not significant. She underwent an angioplasty and drug-eluting stent placement to the LAD without complications. She is now being treated for sleep apnea and using CPAP.  In 2015 she had stress perfusion imaging which was negative for any evidence of ischemia with a well preserved ejection fraction.  This was done to evaluate increased DOE.   Since I saw her she reports that she had more discomfort and recently had another [employer]. At [facility].  I don't have these results but she reports that it was "negative."  She continues to get some chest discomfort. This has happened for a few months. It is sporadic. It happens with rest and with activity and is midsternal. It is unchanged from the discomfort that she had recently.   No Known Allergies  Current Outpatient Prescriptions  Medication Sig Dispense Refill  . acetaminophen (TYLENOL) 500 MG tablet Take 1,000 mg by mouth every 6 (six) hours as needed for mild pain.     Marland Kitchen albuterol (PROAIR HFA) 108 (90 BASE) MCG/ACT inhaler Inhale 2 puffs into the lungs every 6 (six) hours as needed for wheezing. 1 Inhaler 5  . amLODipine (NORVASC) 5 MG tablet TAKE 1 TABLET BY MOUTH DAILY 30 tablet 6  . aspirin EC 81 MG tablet Take 81 mg by mouth daily.    Marland Kitchen escitalopram (LEXAPRO) 20 MG tablet Take 20 mg by mouth daily.   2  . Ferrous Sulfate Dried (FEOSOL) 200 (65 FE) MG TABS Take 2 tablets by mouth every morning.    . fluticasone (FLONASE) 50 MCG/ACT nasal spray Place 2 sprays into both nostrils daily. 1 g 2  . furosemide (LASIX) 40 MG tablet Take 40 mg by mouth daily. For fluid retention    . gabapentin (NEURONTIN) 300 MG  capsule Take 300 mg by mouth 3 (three) times daily.     Marland Kitchen glimepiride (AMARYL) 2 MG tablet Take 2 mg by mouth daily before breakfast.    . insulin lispro (HUMALOG) 100 UNIT/ML injection Inject 12 Units into the skin 2 (two) times daily.    Marland Kitchen levothyroxine (SYNTHROID, LEVOTHROID) 150 MCG tablet Take 150 mcg by mouth daily.      Marland Kitchen losartan (COZAAR) 50 MG tablet TAKE 1 TABLET BY MOUTH DAILY 30 tablet 6  . Menthol-Methyl Salicylate (MUSCLE RUB) 10-15 % CREA Apply 1 application topically as needed (for pain in hands).    . Multiple Vitamin (MULTIVITAMIN WITH MINERALS) TABS tablet Take 1 tablet by mouth daily.    . nitroGLYCERIN (NITROSTAT) 0.4 MG SL tablet Place 1 tablet (0.4 mg total) under the tongue every 5 (five) minutes as needed for chest pain. 25 tablet 3  . pantoprazole (PROTONIX) 40 MG tablet Take 40 mg by mouth 2 (two) times daily.     . potassium chloride (K-DUR,KLOR-CON) 10 MEQ tablet Take 1 tablet by mouth daily.    . pravastatin (PRAVACHOL) 80 MG tablet Take 80 mg by mouth at bedtime.    Nathen May SOLOSTAR 300 UNIT/ML SOPN Inject 75 Units as directed daily.   2  . traMADol (ULTRAM) 50 MG tablet Take 50 mg by mouth 3 (three) times daily.       No  current facility-administered medications for this visit.    Past Medical History  Diagnosis Date  . Diabetes mellitus   . Essential hypertension   . Hypothyroidism   . Anemia   . GERD (gastroesophageal reflux disease)     PUD with UGI bleed in 8/12 dx by EGD  . Hyperlipemia   . Bowel obstruction 1997  . Osteoarthritis   . Chronic kidney disease     questionable  . Gastric ulcer   . CAD (coronary artery disease)     A. 11/2010 - Cath: 60% LAD, 50% RCA ;  B. s/p Promus DES to LAD (by FFR 12/12);  RCA ok by FFR 12/12  . Shortness of breath     Past Surgical History  Procedure Laterality Date  . Colon resection  1990's  . Hand surgery      cyst on left hand  . Nissen fundoplication      questionable - pt unsure of surgical name    . Gastrostomy w/ feeding tube  1990's  . Colon surgery      COLONECTOMY WEARS A BAG  . Abdominal hysterectomy  1970's  . Dilation and curettage of uterus      "before hysterectomy"  . Left and right heart catheterization with coronary angiogram N/A 04/28/2011    Procedure: LEFT AND RIGHT HEART CATHETERIZATION WITH CORONARY ANGIOGRAM;  Surgeon: Tonny BollmanMichael Cooper, MD;  [facility];  Service: Cardiovascular;  Laterality: N/A;    ROS:  As stated in the HPI and negative for all other systems.  PHYSICAL EXAM BP 102/60 mmHg  Pulse 65  Ht 5\' 7"  (1.702 m)  Wt 280 lb (127.007 kg)  BMI 43.84 kg/m2 GENERAL:  Well appearing NECK:  No jugular venous distention, waveform within normal limits, carotid upstroke brisk and symmetric, no bruits, no thyromegaly LUNGS:  Clear to auscultation bilaterally BACK:  No CVA tenderness CHEST:  Unremarkable HEART:  PMI not displaced or sustained,S1 and S2 within normal limits, no S3, no S4, no clicks, no rubs, no murmurs ABD:  Flat, positive bowel sounds normal in frequency in pitch, no bruits, no rebound, no guarding, no midline pulsatile mass, no hepatomegaly, no splenomegaly, colostomy bag EXT:  2 plus pulses throughout, mild bilateral ankle edema, no cyanosis no clubbing   EKG:  NSR, rate 65, axis within normal limits, intervals within normal limits, no acute ST-T wave changes.  07/29/2014   ASSESSMENT AND PLAN   CAD (coronary artery disease) -  The patient has no new sypmtoms.  No further cardiovascular testing is indicated.  We will continue with aggressive risk reduction and meds as listed.  Her current pain has predominantly atypical features and so I will [facility] for further evaluation.  Hypertension -  The blood pressure is at target. No change in medications is indicated. We will continue with therapeutic lifestyle changes (TLC).  Edema -  She  continues to wear compression stockings. She will continue with salt and  fluid restriction.  No change in therapy is indicated.   Dyslipidemia -  She believes that [facility]  Obesity - We have discussed strategies for treatment of this.

## 2014-08-21 ENCOUNTER — Other Ambulatory Visit: Payer: Self-pay | Admitting: Cardiology

## 2014-10-26 ENCOUNTER — Other Ambulatory Visit: Payer: Self-pay

## 2015-06-28 LAB — HEMOGLOBIN A1C: HEMOGLOBIN A1C: 8.2

## 2015-07-19 ENCOUNTER — Ambulatory Visit (INDEPENDENT_AMBULATORY_CARE_PROVIDER_SITE_OTHER): Payer: Medicare Other | Admitting: "Endocrinology

## 2015-07-19 ENCOUNTER — Other Ambulatory Visit: Payer: Self-pay | Admitting: Cardiology

## 2015-07-19 ENCOUNTER — Encounter: Payer: Self-pay | Admitting: "Endocrinology

## 2015-07-19 VITALS — BP 112/70 | HR 72 | Ht 66.5 in | Wt 286.0 lb

## 2015-07-19 DIAGNOSIS — E785 Hyperlipidemia, unspecified: Secondary | ICD-10-CM | POA: Diagnosis not present

## 2015-07-19 DIAGNOSIS — E1159 Type 2 diabetes mellitus with other circulatory complications: Secondary | ICD-10-CM | POA: Diagnosis not present

## 2015-07-19 DIAGNOSIS — E039 Hypothyroidism, unspecified: Secondary | ICD-10-CM

## 2015-07-19 DIAGNOSIS — I10 Essential (primary) hypertension: Secondary | ICD-10-CM

## 2015-07-19 MED ORDER — METFORMIN HCL 500 MG PO TABS: 500.0000 mg | ORAL_TABLET | Freq: Two times a day (BID) | ORAL | Status: DC

## 2015-07-19 NOTE — Progress Notes (Signed)
Subjective:    Patient ID: Jenna Diaz, female    DOB: Jan 07, 1946. Patient is being seen in consultation for management of diabetes requested by  [facility], MD  Past Medical History  Diagnosis Date  . Diabetes mellitus   . Essential hypertension   . Hypothyroidism   . Anemia   . GERD (gastroesophageal reflux disease)     PUD with UGI bleed in 8/12 dx by EGD  . Hyperlipemia   . Bowel obstruction (HCC) 1997  . Osteoarthritis   . Chronic kidney disease     questionable  . Gastric ulcer   . CAD (coronary artery disease)     A. 11/2010 - Cath: 60% LAD, 50% RCA ;  B. s/p Promus DES to LAD (by FFR 12/12);  RCA ok by FFR 12/12  . Shortness of breath   . Sleep apnea     CPAP   Past Surgical History  Procedure Laterality Date  . Colon resection  1990's  . Hand surgery      cyst on left hand  . Nissen fundoplication      questionable - pt unsure of surgical name  . Gastrostomy w/ feeding tube  1990's  . Colon surgery      COLONECTOMY WEARS A BAG  . Abdominal hysterectomy  1970's  . Dilation and curettage of uterus      "before hysterectomy"  . Left and right heart catheterization with coronary angiogram N/A 04/28/2011    Procedure: LEFT AND RIGHT HEART CATHETERIZATION WITH CORONARY ANGIOGRAM;  Surgeon: Tonny Bollman, MD;  Location: [facility] CATH LAB;  Service: Cardiovascular;  Laterality: N/A;   Social History   Social History  . Marital Status: Single    Spouse Name: N/A  . Number of Children: 3  . Years of Education: N/A   Occupational History  . disability    Social History Main Topics  . Smoking status: Never Smoker   . Smokeless tobacco: Never Used  . Alcohol Use: No  . Drug Use: No  . Sexual Activity: No   Other Topics Concern  . None   Social History Narrative   Outpatient Encounter Prescriptions as of 07/19/2015  Medication Sig  . aspirin EC 81 MG tablet Take 81 mg by mouth daily.  Marland Kitchen escitalopram (LEXAPRO) 20 MG tablet Take 20 mg by mouth daily.    . fluticasone (FLONASE) 50 MCG/ACT nasal spray Place 2 sprays into both nostrils daily.  . furosemide (LASIX) 40 MG tablet Take 40 mg by mouth daily. For fluid retention  . gabapentin (NEURONTIN) 300 MG capsule Take 300 mg by mouth 3 (three) times daily.   Marland Kitchen levothyroxine (SYNTHROID, LEVOTHROID) 150 MCG tablet Take 150 mcg by mouth daily.    Marland Kitchen losartan (COZAAR) 50 MG tablet Take 1 tablet (50 mg total) by mouth daily. Please schedule appointment for refills.  . pantoprazole (PROTONIX) 40 MG tablet Take 40 mg by mouth 2 (two) times daily.   . potassium chloride (K-DUR,KLOR-CON) 10 MEQ tablet Take 1 tablet by mouth daily.  Nathen May SOLOSTAR 300 UNIT/ML SOPN Inject 60 Units as directed at bedtime.  . traMADol (ULTRAM) 50 MG tablet Take 50 mg by mouth 3 (three) times daily.    . [DISCONTINUED] glimepiride (AMARYL) 2 MG tablet Take 2 mg by mouth daily before breakfast.  . acetaminophen (TYLENOL) 500 MG tablet Take 1,000 mg by mouth every 6 (six) hours as needed for mild pain.   Marland Kitchen albuterol (PROAIR HFA) 108 (90 BASE) MCG/ACT  inhaler Inhale 2 puffs into the lungs every 6 (six) hours as needed for wheezing.  Marland Kitchen. amLODipine (NORVASC) 5 MG tablet Take 1 tablet (5 mg total) by mouth daily. Please schedule appointment for refills.  . Ferrous Sulfate Dried (FEOSOL) 200 (65 FE) MG TABS Take 2 tablets by mouth every morning.  . insulin lispro (HUMALOG) 100 UNIT/ML injection Inject 10-16 Units into the skin 3 (three) times daily before meals.  . Menthol-Methyl Salicylate (MUSCLE RUB) 10-15 % CREA Apply 1 application topically as needed (for pain in hands).  . metFORMIN (GLUCOPHAGE) 500 MG tablet Take 1 tablet (500 mg total) by mouth 2 (two) times daily with a meal.  . Multiple Vitamin (MULTIVITAMIN WITH MINERALS) TABS tablet Take 1 tablet by mouth daily.  . nitroGLYCERIN (NITROSTAT) 0.4 MG SL tablet Place 1 tablet (0.4 mg total) under the tongue every 5 (five) minutes as needed for chest pain.  . pravastatin  (PRAVACHOL) 80 MG tablet Take 80 mg by mouth at bedtime.   No facility-administered encounter medications on file as of 07/19/2015.   ALLERGIES: No Known Allergies VACCINATION STATUS: Immunization History  Administered Date(s) Administered  . Influenza Split 04/11/2011, 02/03/2013  . Influenza Whole 01/23/2012, 02/08/2014  . Pneumococcal Polysaccharide-23 11/30/2010    Diabetes She presents for her initial diabetic visit. She has type 2 diabetes mellitus. Onset time: She was diagnosed at approximate age of 42 years. Her disease course has been worsening. There are no hypoglycemic associated symptoms. Pertinent negatives for hypoglycemia include no confusion, headaches, pallor or seizures. Associated symptoms include fatigue, polydipsia and polyuria. Pertinent negatives for diabetes include no chest pain and no polyphagia. There are no hypoglycemic complications. Symptoms are worsening. Diabetic complications include heart disease. Risk factors for coronary artery disease include diabetes mellitus, dyslipidemia, family history, hypertension, female sex, obesity and sedentary lifestyle. Current diabetic treatment includes insulin injections and oral agent (monotherapy). Her weight is stable. She is following a generally unhealthy diet. When asked about meal planning, she reported none. She has not had a previous visit with a dietitian. She never participates in exercise. Home blood sugar record trend: She is not monitoring blood glucose regularly, did not bring the meter with her. An ACE inhibitor/angiotensin II receptor blocker is being taken.  Hyperlipidemia This is a chronic problem. The current episode started more than 1 year ago. Pertinent negatives include no chest pain, myalgias or shortness of breath. Current antihyperlipidemic treatment includes statins. Risk factors for coronary artery disease include diabetes mellitus, dyslipidemia, hypertension, female sex and a sedentary lifestyle.   Hypertension This is a chronic problem. The current episode started more than 1 year ago. Pertinent negatives include no chest pain, headaches, palpitations or shortness of breath. Risk factors for coronary artery disease include dyslipidemia, diabetes mellitus, obesity and sedentary lifestyle. Past treatments include angiotensin blockers. Hypertensive end-organ damage includes CAD/MI.      Review of Systems  Constitutional: Positive for fatigue. Negative for fever, chills and unexpected weight change.  HENT: Negative for trouble swallowing and voice change.   Eyes: Negative for visual disturbance.  Respiratory: Negative for cough, shortness of breath and wheezing.   Cardiovascular: Negative for chest pain, palpitations and leg swelling.  Gastrointestinal: Negative for nausea, vomiting and diarrhea.  Endocrine: Positive for polydipsia and polyuria. Negative for cold intolerance, heat intolerance and polyphagia.  Musculoskeletal: Negative for myalgias and arthralgias.  Skin: Negative for color change, pallor, rash and wound.  Neurological: Negative for seizures and headaches.  Psychiatric/Behavioral: Negative for suicidal ideas and  confusion.    Objective:    BP 112/70 mmHg  Pulse 72  Ht 5' 6.5" (1.689 m)  Wt 286 lb (129.729 kg)  BMI 45.48 kg/m2  SpO2 98%  Wt Readings from Last 3 Encounters:  07/19/15 286 lb (129.729 kg)  07/29/14 280 lb (127.007 kg)  04/10/14 259 lb (117.482 kg)    Physical Exam  Constitutional: She is oriented to person, place, and time. She appears well-developed.  HENT:  Head: Normocephalic and atraumatic.  Eyes: EOM are normal.  Neck: Normal range of motion. Neck supple. No tracheal deviation present. No thyromegaly present.  Cardiovascular: Normal rate and regular rhythm.   Pulmonary/Chest: Effort normal and breath sounds normal.  Abdominal: Soft. Bowel sounds are normal. There is no tenderness. There is no guarding.  Colostomy bag in place.   Musculoskeletal: Normal range of motion. She exhibits no edema.  Neurological: She is alert and oriented to person, place, and time. She has normal reflexes. No cranial nerve deficit. Coordination normal.  Skin: Skin is warm and dry. No rash noted. No erythema. No pallor.  Psychiatric: She has a normal mood and affect. Judgment normal.     CMP ( most recent) CMP     Component Value Date/Time   NA 144 07/25/2013 2031   K 3.7 07/25/2013 2031   CL 106 07/25/2013 2031   CO2 26 07/25/2013 2031   GLUCOSE 134* 07/25/2013 2031   BUN 9 07/25/2013 2031   CREATININE 1.12* 07/25/2013 2031   CALCIUM 9.5 07/25/2013 2031   PROT 6.6 07/25/2013 2031   ALBUMIN 3.1* 07/25/2013 2031   AST 24 07/25/2013 2031   ALT 24 07/25/2013 2031   ALKPHOS 73 07/25/2013 2031   BILITOT 0.4 07/25/2013 2031   GFRNONAA 50* 07/25/2013 2031   GFRAA 58* 07/25/2013 2031         Assessment & Plan:   1. DM type 2 causing vascular disease (HCC)  - Patient has currently uncontrolled symptomatic type 2 DM since  70 years of age,  with most recent A1c of 8.2 %. Recent labs reviewed.   Her diabetes is complicated by coronary artery disease status post stent placement and mild renal insufficiency and patient remains at a high risk for more acute and chronic complications of diabetes which include CAD, CVA, CKD, retinopathy, and neuropathy. These are all discussed in detail with the patient.  - I have counseled the patient on diet management and weight loss, by adopting a carbohydrate restricted/protein rich diet.  - Suggestion is made for patient to avoid simple carbohydrates   from their diet including Cakes , Desserts, Ice Cream,  Soda (  diet and regular) , Sweet Tea , Candies,  Chips, Cookies, Artificial Sweeteners,   and "Sugar-free" Products . This will help patient to have stable blood glucose profile and potentially avoid unintended weight gain.  - I encouraged the patient to switch to  unprocessed or minimally  processed complex starch and increased protein intake (animal or plant source), fruits, and vegetables.  - Patient is advised to stick to a routine mealtimes to eat 3 meals  a day and avoid unnecessary snacks ( to snack only to correct hypoglycemia).  - The patient will be scheduled with Norm Salt, RDN, CDE for individualized DM education.  - I have approached patient with the following individualized plan to manage diabetes and patient agrees:   - I  will proceed to initiate basal insulin toujeo 60 units QHS, and prandial insulin Humalog to 10 units  TIDAC for pre-meal BG readings of 90-150mg /dl, plus patient specific correction dose for unexpected hyperglycemia above /dl, associated with strict monitoring of glucose  AC and HS. - Patient is warned not to take insulin without proper monitoring per orders. -Adjustment parameters are given for hypo and hyperglycemia in writing. -Patient is encouraged to call clinic for blood glucose levels less than 70 or above 300 mg /dl. - I will restart metformin at low-dose 500 mg by mouth twice a day, therapeutically suitable for patient.. - I will discontinue Amaryl, risk outweighs benefit for this patient. -Patient is not a candidate for SGLT2 inhibitors due to CKD.  - Patient will be considered for incretin therapy as appropriate next visit. - Patient specific target  A1c;  LDL, HDL, Triglycerides, and  Waist Circumference were discussed in detail.  2) BP/HTN: Controlled. Continue current medications including ACEI/ARB. 3) Lipids/HPL:  Controlled, continue statins. 4)  Weight/Diet: CDE Consult will be initiated , exercise, and detailed carbohydrates information provided.  5) Hypothyroidism, unspecified hypothyroidism type -No recent thyroid function tests. I advised her to continue levothyroxine 150 g by mouth every morning for now.  - We discussed about correct intake of levothyroxine, at fasting, with water, separated by at least 30  minutes from breakfast, and separated by more than 4 hours from calcium, iron, multivitamins, acid reflux medications (PPIs). -Patient is made aware of the fact that thyroid hormone replacement is needed for life, dose to be adjusted by periodic monitoring of thyroid function tests.  6) Chronic Care/Health Maintenance:  -Patient is on ACEI/ARB and Statin medications and encouraged to continue to follow up with Ophthalmology, Podiatrist at least yearly or according to recommendations, and advised to   stay away from smoking. I have recommended yearly flu vaccine and pneumonia vaccination at least every 5 years; moderate intensity exercise for up to 150 minutes weekly; and  sleep for at least 7 hours a day.  - 60 minutes of time was spent on the care of this patient , 50% of which was applied for counseling on diabetes complications and their preventions.  - Patient to bring meter and  blood glucose logs during their next visit.   - I advised patient to maintain close follow up with [facility], MD for primary care needs.  Follow up plan: - Return in about 1 week (around 07/26/2015) for diabetes, high blood pressure, high cholesterol, underactive thyroid.  Marquis Lunch, MD Phone: [phone]  Fax: [phone]   07/19/2015, 2:50 PM

## 2015-07-19 NOTE — Patient Instructions (Signed)

## 2015-07-19 NOTE — Telephone Encounter (Signed)
Rx request sent to pharmacy.  

## 2015-07-29 ENCOUNTER — Encounter: Payer: Self-pay | Admitting: "Endocrinology

## 2015-07-29 ENCOUNTER — Ambulatory Visit (INDEPENDENT_AMBULATORY_CARE_PROVIDER_SITE_OTHER): Payer: Medicare Other | Admitting: "Endocrinology

## 2015-07-29 VITALS — BP 118/67 | HR 65 | Ht 66.5 in | Wt 277.0 lb

## 2015-07-29 DIAGNOSIS — E1159 Type 2 diabetes mellitus with other circulatory complications: Secondary | ICD-10-CM

## 2015-07-29 DIAGNOSIS — E039 Hypothyroidism, unspecified: Secondary | ICD-10-CM

## 2015-07-29 DIAGNOSIS — I10 Essential (primary) hypertension: Secondary | ICD-10-CM | POA: Diagnosis not present

## 2015-07-29 DIAGNOSIS — E785 Hyperlipidemia, unspecified: Secondary | ICD-10-CM | POA: Diagnosis not present

## 2015-07-29 NOTE — Progress Notes (Signed)
Subjective:    Patient ID: Jenna Diaz, female    DOB: 12/25/1945. Patient is being seen in consultation for management of diabetes requested by  [facility], MD  Past Medical History  Diagnosis Date  . Diabetes mellitus   . Essential hypertension   . Hypothyroidism   . Anemia   . GERD (gastroesophageal reflux disease)     PUD with UGI bleed in 8/12 dx by EGD  . Hyperlipemia   . Bowel obstruction (HCC) 1997  . Osteoarthritis   . Chronic kidney disease     questionable  . Gastric ulcer   . CAD (coronary artery disease)     A. 11/2010 - Cath: 60% LAD, 50% RCA ;  B. s/p Promus DES to LAD (by FFR 12/12);  RCA ok by FFR 12/12  . Shortness of breath   . Sleep apnea     CPAP   Past Surgical History  Procedure Laterality Date  . Colon resection  1990's  . Hand surgery      cyst on left hand  . Nissen fundoplication      questionable - pt unsure of surgical name  . Gastrostomy w/ feeding tube  1990's  . Colon surgery      COLONECTOMY WEARS A BAG  . Abdominal hysterectomy  1970's  . Dilation and curettage of uterus      "before hysterectomy"  . Left and right heart catheterization with coronary angiogram N/A 04/28/2011    Procedure: LEFT AND RIGHT HEART CATHETERIZATION WITH CORONARY ANGIOGRAM;  Surgeon: Tonny Bollman, MD;  Location: [facility] CATH LAB;  Service: Cardiovascular;  Laterality: N/A;   Social History   Social History  . Marital Status: Single    Spouse Name: N/A  . Number of Children: 3  . Years of Education: N/A   Occupational History  . disability    Social History Main Topics  . Smoking status: Never Smoker   . Smokeless tobacco: Never Used  . Alcohol Use: No  . Drug Use: No  . Sexual Activity: No   Other Topics Concern  . None   Social History Narrative   Outpatient Encounter Prescriptions as of 07/29/2015  Medication Sig  . acetaminophen (TYLENOL) 500 MG tablet Take 1,000 mg by mouth every 6 (six) hours as needed for mild pain.   Marland Kitchen  albuterol (PROAIR HFA) 108 (90 BASE) MCG/ACT inhaler Inhale 2 puffs into the lungs every 6 (six) hours as needed for wheezing.  Marland Kitchen amLODipine (NORVASC) 5 MG tablet Take 1 tablet (5 mg total) by mouth daily. Please schedule appointment for refills.  Marland Kitchen aspirin EC 81 MG tablet Take 81 mg by mouth daily.  Marland Kitchen escitalopram (LEXAPRO) 20 MG tablet Take 20 mg by mouth daily.   . Ferrous Sulfate Dried (FEOSOL) 200 (65 FE) MG TABS Take 2 tablets by mouth every morning.  . fluticasone (FLONASE) 50 MCG/ACT nasal spray Place 2 sprays into both nostrils daily.  . furosemide (LASIX) 40 MG tablet Take 40 mg by mouth daily. For fluid retention  . gabapentin (NEURONTIN) 300 MG capsule Take 300 mg by mouth 3 (three) times daily.   . insulin lispro (HUMALOG) 100 UNIT/ML injection Inject 5-11 Units into the skin 3 (three) times daily before meals.  Marland Kitchen levothyroxine (SYNTHROID, LEVOTHROID) 150 MCG tablet Take 150 mcg by mouth daily.    Marland Kitchen losartan (COZAAR) 50 MG tablet Take 1 tablet (50 mg total) by mouth daily. Please schedule appointment for refills.  . Menthol-Methyl Salicylate (MUSCLE  RUB) 10-15 % CREA Apply 1 application topically as needed (for pain in hands).  . metFORMIN (GLUCOPHAGE) 500 MG tablet Take 1 tablet (500 mg total) by mouth 2 (two) times daily with a meal.  . Multiple Vitamin (MULTIVITAMIN WITH MINERALS) TABS tablet Take 1 tablet by mouth daily.  . pantoprazole (PROTONIX) 40 MG tablet Take 40 mg by mouth 2 (two) times daily.   . potassium chloride (K-DUR,KLOR-CON) 10 MEQ tablet Take 1 tablet by mouth daily.  . pravastatin (PRAVACHOL) 80 MG tablet Take 80 mg by mouth at bedtime.  Nathen May. TOUJEO SOLOSTAR 300 UNIT/ML SOPN Inject 50 Units as directed at bedtime.  . traMADol (ULTRAM) 50 MG tablet Take 50 mg by mouth 3 (three) times daily.    . nitroGLYCERIN (NITROSTAT) 0.4 MG SL tablet Place 1 tablet (0.4 mg total) under the tongue every 5 (five) minutes as needed for chest pain.   No facility-administered  encounter medications on file as of 07/29/2015.   ALLERGIES: No Known Allergies VACCINATION STATUS: Immunization History  Administered Date(s) Administered  . Influenza Split 04/11/2011, 02/03/2013  . Influenza Whole 01/23/2012, 02/08/2014  . Pneumococcal Polysaccharide-23 11/30/2010    Diabetes She presents for her follow-up diabetic visit. She has type 2 diabetes mellitus. Onset time: She was diagnosed at approximate age of 42 years. Her disease course has been improving. There are no hypoglycemic associated symptoms. Pertinent negatives for hypoglycemia include no confusion, headaches, pallor or seizures. Associated symptoms include fatigue. Pertinent negatives for diabetes include no chest pain, no polydipsia, no polyphagia and no polyuria. There are no hypoglycemic complications. Symptoms are improving. Diabetic complications include heart disease. Risk factors for coronary artery disease include diabetes mellitus, dyslipidemia, family history, hypertension, female sex, obesity and sedentary lifestyle. Current diabetic treatment includes insulin injections and oral agent (monotherapy). She is compliant with treatment most of the time. Her weight is decreasing steadily (She lost 9 pounds since last week.). She is following a generally unhealthy diet. When asked about meal planning, she reported none. She has not had a previous visit with a dietitian. She never participates in exercise. Home blood sugar record trend: She brought in her meter and logs showing on target readings for the last 7 days. Her breakfast blood glucose range is generally 140-180 mg/dl. Her lunch blood glucose range is generally 140-180 mg/dl. Her dinner blood glucose range is generally 140-180 mg/dl. Her overall blood glucose range is 140-180 mg/dl. An ACE inhibitor/angiotensin II receptor blocker is being taken.  Hyperlipidemia This is a chronic problem. The current episode started more than 1 year ago. Pertinent negatives  include no chest pain, myalgias or shortness of breath. Current antihyperlipidemic treatment includes statins. Risk factors for coronary artery disease include diabetes mellitus, dyslipidemia, hypertension, female sex and a sedentary lifestyle.  Hypertension This is a chronic problem. The current episode started more than 1 year ago. Pertinent negatives include no chest pain, headaches, palpitations or shortness of breath. Risk factors for coronary artery disease include dyslipidemia, diabetes mellitus, obesity and sedentary lifestyle. Past treatments include angiotensin blockers. Hypertensive end-organ damage includes CAD/MI.      Review of Systems  Constitutional: Positive for fatigue. Negative for fever, chills and unexpected weight change.  HENT: Negative for trouble swallowing and voice change.   Eyes: Negative for visual disturbance.  Respiratory: Negative for cough, shortness of breath and wheezing.   Cardiovascular: Negative for chest pain, palpitations and leg swelling.  Gastrointestinal: Negative for nausea, vomiting and diarrhea.  Endocrine: Negative for cold intolerance, heat  intolerance, polydipsia, polyphagia and polyuria.  Musculoskeletal: Negative for myalgias and arthralgias.  Skin: Negative for color change, pallor, rash and wound.  Neurological: Negative for seizures and headaches.  Psychiatric/Behavioral: Negative for suicidal ideas and confusion.    Objective:    BP 118/67 mmHg  Pulse 65  Ht 5' 6.5" (1.689 m)  Wt 277 lb (125.646 kg)  BMI 44.04 kg/m2  SpO2 98%  Wt Readings from Last 3 Encounters:  07/29/15 277 lb (125.646 kg)  07/19/15 286 lb (129.729 kg)  07/29/14 280 lb (127.007 kg)    Physical Exam  Constitutional: She is oriented to person, place, and time. She appears well-developed.  HENT:  Head: Normocephalic and atraumatic.  Eyes: EOM are normal.  Neck: Normal range of motion. Neck supple. No tracheal deviation present. No thyromegaly present.   Cardiovascular: Normal rate and regular rhythm.   Pulmonary/Chest: Effort normal and breath sounds normal.  Abdominal: Soft. Bowel sounds are normal. There is no tenderness. There is no guarding.  Colostomy bag in place.  Musculoskeletal: Normal range of motion. She exhibits no edema.  Neurological: She is alert and oriented to person, place, and time. She has normal reflexes. No cranial nerve deficit. Coordination normal.  Skin: Skin is warm and dry. No rash noted. No erythema. No pallor.  Psychiatric: She has a normal mood and affect. Judgment normal.     CMP ( most recent) CMP     Component Value Date/Time   NA 144 07/25/2013 2031   K 3.7 07/25/2013 2031   CL 106 07/25/2013 2031   CO2 26 07/25/2013 2031   GLUCOSE 134* 07/25/2013 2031   BUN 9 07/25/2013 2031   CREATININE 1.12* 07/25/2013 2031   CALCIUM 9.5 07/25/2013 2031   PROT 6.6 07/25/2013 2031   ALBUMIN 3.1* 07/25/2013 2031   AST 24 07/25/2013 2031   ALT 24 07/25/2013 2031   ALKPHOS 73 07/25/2013 2031   BILITOT 0.4 07/25/2013 2031   GFRNONAA 50* 07/25/2013 2031   GFRAA 58* 07/25/2013 2031   Her most recent A1c was 8.2%   Assessment & Plan:   1. DM type 2 causing vascular disease (HCC)  - Patient has currently uncontrolled symptomatic type 2 DM since  70 years of age,  with most recent A1c of 8.2 %. Recent labs reviewed. -She came with significantly improved blood glucose profile on basal/bolus insulin.  Her diabetes is complicated by coronary artery disease status post stent placement and mild renal insufficiency and patient remains at a high risk for more acute and chronic complications of diabetes which include CAD, CVA, CKD, retinopathy, and neuropathy. These are all discussed in detail with the patient.  - I have counseled the patient on diet management and weight loss, by adopting a carbohydrate restricted/protein rich diet.  - Suggestion is made for patient to avoid simple carbohydrates   from their diet  including Cakes , Desserts, Ice Cream,  Soda (  diet and regular) , Sweet Tea , Candies,  Chips, Cookies, Artificial Sweeteners,   and "Sugar-free" Products . This will help patient to have stable blood glucose profile and potentially avoid unintended weight gain.  - I encouraged the patient to switch to  unprocessed or minimally processed complex starch and increased protein intake (animal or plant source), fruits, and vegetables.  - Patient is advised to stick to a routine mealtimes to eat 3 meals  a day and avoid unnecessary snacks ( to snack only to correct hypoglycemia).  - The patient will be scheduled  with Norm Salt, RDN, CDE for individualized DM education.  - I have approached patient with the following individualized plan to manage diabetes and patient agrees:   - I  will lower toujeo to 50 units QHS, and prandial insulin Humalog to 5 units TIDAC for pre-meal BG readings of 90-150mg /dl, plus patient specific correction dose for unexpected hyperglycemia above 150mg /dl, associated with strict monitoring of glucose  AC and HS. - Patient is warned not to take insulin without proper monitoring per orders. -Adjustment parameters are given for hypo and hyperglycemia in writing. -Patient is encouraged to call clinic for blood glucose levels less than 70 or above 300 mg /dl. - I will restart metformin at low-dose 500 mg by mouth twice a day, therapeutically suitable for patient.. - I will discontinue Amaryl, risk outweighs benefit for this patient. -Patient is not a candidate for SGLT2 inhibitors due to CKD.  - Patient will be considered for incretin therapy as appropriate next visit. - Patient specific target  A1c;  LDL, HDL, Triglycerides, and  Waist Circumference were discussed in detail.  2) BP/HTN: Controlled. Continue current medications including ACEI/ARB. 3) Lipids/HPL:  Controlled, continue statins. 4)  Weight/Diet: CDE Consult will be initiated , exercise, and detailed  carbohydrates information provided.  5) Hypothyroidism, unspecified hypothyroidism type -No recent thyroid function tests. I advised her to continue levothyroxine 150 g by mouth every morning for now.  - We discussed about correct intake of levothyroxine, at fasting, with water, separated by at least 30 minutes from breakfast, and separated by more than 4 hours from calcium, iron, multivitamins, acid reflux medications (PPIs). -Patient is made aware of the fact that thyroid hormone replacement is needed for life, dose to be adjusted by periodic monitoring of thyroid function tests.  6) Chronic Care/Health Maintenance:  -Patient is on ACEI/ARB and Statin medications and encouraged to continue to follow up with Ophthalmology, Podiatrist at least yearly or according to recommendations, and advised to   stay away from smoking. I have recommended yearly flu vaccine and pneumonia vaccination at least every 5 years; moderate intensity exercise for up to 150 minutes weekly; and  sleep for at least 7 hours a day.  - 25 minutes of time was spent on the care of this patient , 50% of which was applied for counseling on diabetes complications and their preventions.  - Patient to bring meter and  blood glucose logs during their next visit.   - I advised patient to maintain close follow up with [facility], MD for primary care needs.  Follow up plan: - Return in about 10 weeks (around 10/07/2015) for diabetes, high blood pressure, high cholesterol, underactive thyroid, follow up with pre-visit labs, meter, and logs.  Marquis Lunch, MD Phone: [phone]  Fax: [phone]   07/29/2015, 11:41 AM

## 2015-07-29 NOTE — Patient Instructions (Signed)
Advice for weight management -For most of us the best way to lose weight is by diet management. Generally speaking, diet management means restricting carbohydrate consumption to minimum possible (and to unprocessed or minimally processed complex starch) and increasing protein intake (animal or plant source), fruits, and vegetables.  -Sticking to a routine mealtime to eat 3 meals a day and avoiding unnecessary snacks is shown to have a big role in weight control.  -It is better to avoid simple carbohydrates including: Cakes, Desserts, Ice Cream, Soda (diet and regular), Sweet Tea, Candies, Chips, Cookies, Artificial Sweeteners, and "Sugar-free" Products.   -Exercise: 30 minutes a day 3-4 days a week, or 150 minutes a week. Combine stretch, strength, and aerobic activities. You may seek evaluation by your heart doctor prior to initiating exercise if you have high risk for heart disease.  -If you are interested, we can schedule a visit with Jenna Diaz, RDN, CDE for individualized nutrition education.  

## 2015-08-16 ENCOUNTER — Encounter: Payer: Self-pay | Admitting: Nutrition

## 2015-08-16 ENCOUNTER — Encounter: Payer: Medicare Other | Attending: "Endocrinology | Admitting: Nutrition

## 2015-08-16 DIAGNOSIS — E1159 Type 2 diabetes mellitus with other circulatory complications: Secondary | ICD-10-CM | POA: Diagnosis present

## 2015-08-16 DIAGNOSIS — Z932 Ileostomy status: Secondary | ICD-10-CM

## 2015-08-16 DIAGNOSIS — E118 Type 2 diabetes mellitus with unspecified complications: Secondary | ICD-10-CM

## 2015-08-16 NOTE — Progress Notes (Signed)
  Medical Nutrition Therapy:  Appt start time: 1530 end time:  1630.   Assessment:  Primary concerns today: Diabetes.Lives with her three sisters. Most recent 8.2%. Eats 2 meals per day. Eats out a lot. 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 Says she is often not hungry. Is often taking care of others and not taking care of herself she notes. 60 units of Toujeo and 10 units Humalog with meals. Often skips meals. 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 FBS:228 mg/dl. Before breakfast. 195 mg/dl Last night Diet is inconsistent in CHO, protein and fresh fruits and vegetables. Diet is higher in fat, sodium.  Lab Results  Component Value Date   HGBA1C 8.2 06/28/2015    Preferred Learning Style:   Auditory  Visual  Hands on  No preference indicated   Learning Readiness:   Ready  Change in progress   MEDICATIONS: see list   DIETARY INTAKE:  24-hr recall:  B ( AM): Cherrios, 2% milk OR glass of milk OR eggs, bacon, sausage, fruit Snk ( AM): none L ( PM): Skipped late due to church, or banana Snk ( PM):none D ( PM): Ham, buttered potaotes, green beans, peas, water Snk ( PM):  Beverages: Water, gatorade Usual physical activity: ADL  Estimated energy needs: 1500 calories 170 g carbohydrates 112 g protein 42 g fat  Progress Towards Goal(s):  In progress.   Nutritional Diagnosis:  NB-1.1 Food and nutrition-related knowledge deficit As related to Diabetes.  As evidenced by A1C 8.2%..    Intervention:  Nutrition and Diabetes education provided on My Plate, CHO counting, meal planning, portion sizes, timing of meals, avoiding snacks between meals unless having a low blood sugar, target ranges for A1C and blood sugars, signs/symptoms and treatment of hyper/hypoglycemia, monitoring blood sugars, taking medications as prescribed, benefits of exercising 30 minutes per day and prevention of complications of DM.  Goals 1. Follow Plate Method 2. Eat  three meals per meals per day. 3.  Walk 5 minutes per day. 4. Increase fresh fruiit and low carb vegetables. 5. Get A1C down to 8% 6. Lose 1 lbs per week.   Teaching Method Utilized:  Visual Auditory Hands on  Handouts given during visit include:  The Plate Method  Meal plan Card  Diabetes Instructions .  Barriers to learning/adherence to lifestyle change:  None  Demonstrated degree of understanding via:  Teach Back   Monitoring/Evaluation:  Dietary intake, exercise, meal planning, SBG, and body weight in 1 month(s).

## 2015-08-16 NOTE — Patient Instructions (Signed)
GoalS 1. Follow Plate Method 2. Eat three meals per meals per day. 3.  Walk 5 minutes per day. 4. Increase fresh fruiit and low carb vegetables. 5. Get A1C down to 8% 6. Lose 1 lbs per week

## 2015-10-01 ENCOUNTER — Other Ambulatory Visit: Payer: Self-pay | Admitting: "Endocrinology

## 2015-10-02 LAB — BASIC METABOLIC PANEL
BUN/Creatinine Ratio: 7 — ABNORMAL LOW (ref 12–28)
BUN: 9 mg/dL (ref 8–27)
CALCIUM: 9.3 mg/dL (ref 8.7–10.3)
CO2: 22 mmol/L (ref 18–29)
CREATININE: 1.3 mg/dL — AB (ref 0.57–1.00)
Chloride: 101 mmol/L (ref 96–106)
GFR calc Af Amer: 49 mL/min/{1.73_m2} — ABNORMAL LOW (ref >59)
GFR, EST NON AFRICAN AMERICAN: 43 mL/min/{1.73_m2} — AB (ref >59)
GLUCOSE: 183 mg/dL — AB (ref 65–99)
POTASSIUM: 4.1 mmol/L (ref 3.5–5.2)
Sodium: 138 mmol/L (ref 134–144)

## 2015-10-02 LAB — T4, FREE: FREE T4: 1.39 ng/dL (ref 0.82–1.77)

## 2015-10-02 LAB — MICROALBUMIN / CREATININE URINE RATIO
CREATININE, UR: 290.1 mg/dL
MICROALB/CREAT RATIO: 15.6 mg/g creat (ref 0.0–30.0)
Microalbumin, Urine: 45.4 ug/mL

## 2015-10-02 LAB — LIPID PANEL W/O CHOL/HDL RATIO
CHOLESTEROL TOTAL: 179 mg/dL (ref 100–199)
HDL: 66 mg/dL (ref >39)
LDL Calculated: 87 mg/dL (ref 0–99)
TRIGLYCERIDES: 130 mg/dL (ref 0–149)
VLDL Cholesterol Cal: 26 mg/dL (ref 5–40)

## 2015-10-02 LAB — HGB A1C W/O EAG: Hgb A1c MFr Bld: 9.2 % — ABNORMAL HIGH (ref 4.8–5.6)

## 2015-10-02 LAB — TSH: TSH: 0.561 u[IU]/mL (ref 0.450–4.500)

## 2015-10-07 ENCOUNTER — Ambulatory Visit: Payer: Medicare Other | Admitting: "Endocrinology

## 2015-10-07 ENCOUNTER — Ambulatory Visit: Payer: Medicare Other | Admitting: Nutrition

## 2015-10-11 ENCOUNTER — Ambulatory Visit (INDEPENDENT_AMBULATORY_CARE_PROVIDER_SITE_OTHER): Payer: Medicare Other | Admitting: "Endocrinology

## 2015-10-11 ENCOUNTER — Encounter: Payer: Self-pay | Admitting: "Endocrinology

## 2015-10-11 ENCOUNTER — Encounter: Payer: Medicare Other | Attending: "Endocrinology | Admitting: Nutrition

## 2015-10-11 VITALS — BP 142/74 | HR 54 | Ht 66.0 in | Wt 272.0 lb

## 2015-10-11 VITALS — Ht 66.0 in | Wt 272.0 lb

## 2015-10-11 DIAGNOSIS — E785 Hyperlipidemia, unspecified: Secondary | ICD-10-CM | POA: Diagnosis not present

## 2015-10-11 DIAGNOSIS — IMO0002 Reserved for concepts with insufficient information to code with codable children: Secondary | ICD-10-CM

## 2015-10-11 DIAGNOSIS — E038 Other specified hypothyroidism: Secondary | ICD-10-CM | POA: Diagnosis not present

## 2015-10-11 DIAGNOSIS — I10 Essential (primary) hypertension: Secondary | ICD-10-CM | POA: Diagnosis not present

## 2015-10-11 DIAGNOSIS — E1159 Type 2 diabetes mellitus with other circulatory complications: Secondary | ICD-10-CM

## 2015-10-11 DIAGNOSIS — Z794 Long term (current) use of insulin: Secondary | ICD-10-CM

## 2015-10-11 DIAGNOSIS — E1165 Type 2 diabetes mellitus with hyperglycemia: Secondary | ICD-10-CM

## 2015-10-11 DIAGNOSIS — E118 Type 2 diabetes mellitus with unspecified complications: Secondary | ICD-10-CM

## 2015-10-11 NOTE — Patient Instructions (Signed)
Goals 1. Follow Plate Method 2. Eat three meals per meals per day. Be sure test blood sugars betore meals Use sliding scale insulin before meals Don't skip meals Take 60 units of Toujeo at 9 pm. 3. Increase fresh fruiit and low carb vegetables. 5. Get A1C down to 8% 6. Lose 1 lbs per week.

## 2015-10-11 NOTE — Progress Notes (Signed)
Subjective:    Patient ID: Jenna Diaz, female    DOB: 1945/10/23. Patient is being seen in consultation for management of diabetes requested by  [facility], MD  Past Medical History  Diagnosis Date  . Diabetes mellitus   . Essential hypertension   . Hypothyroidism   . Anemia   . GERD (gastroesophageal reflux disease)     PUD with UGI bleed in 8/12 dx by EGD  . Hyperlipemia   . Bowel obstruction (HCC) 1997  . Osteoarthritis   . Chronic kidney disease     questionable  . Gastric ulcer   . CAD (coronary artery disease)     A. 11/2010 - Cath: 60% LAD, 50% RCA ;  B. s/p Promus DES to LAD (by FFR 12/12);  RCA ok by FFR 12/12  . Shortness of breath   . Sleep apnea     CPAP   Past Surgical History  Procedure Laterality Date  . Colon resection  1990's  . Hand surgery      cyst on left hand  . Nissen fundoplication      questionable - pt unsure of surgical name  . Gastrostomy w/ feeding tube  1990's  . Colon surgery      COLONECTOMY WEARS A BAG  . Abdominal hysterectomy  1970's  . Dilation and curettage of uterus      "before hysterectomy"  . Left and right heart catheterization with coronary angiogram N/A 04/28/2011    Procedure: LEFT AND RIGHT HEART CATHETERIZATION WITH CORONARY ANGIOGRAM;  Surgeon: Tonny Bollman, MD;  Location: [facility] CATH LAB;  Service: Cardiovascular;  Laterality: N/A;   Social History   Social History  . Marital Status: Single    Spouse Name: N/A  . Number of Children: 3  . Years of Education: N/A   Occupational History  . disability    Social History Main Topics  . Smoking status: Never Smoker   . Smokeless tobacco: Never Used  . Alcohol Use: No  . Drug Use: No  . Sexual Activity: No   Other Topics Concern  . None   Social History Narrative   Outpatient Encounter Prescriptions as of 10/11/2015  Medication Sig  . acetaminophen (TYLENOL) 500 MG tablet Take 1,000 mg by mouth every 6 (six) hours as needed for mild pain.   Marland Kitchen  albuterol (PROAIR HFA) 108 (90 BASE) MCG/ACT inhaler Inhale 2 puffs into the lungs every 6 (six) hours as needed for wheezing.  Marland Kitchen amLODipine (NORVASC) 5 MG tablet Take 1 tablet (5 mg total) by mouth daily. Please schedule appointment for refills.  Marland Kitchen aspirin EC 81 MG tablet Take 81 mg by mouth daily.  Marland Kitchen escitalopram (LEXAPRO) 20 MG tablet Take 20 mg by mouth daily.   . Ferrous Sulfate Dried (FEOSOL) 200 (65 FE) MG TABS Take 2 tablets by mouth every morning.  . fluticasone (FLONASE) 50 MCG/ACT nasal spray Place 2 sprays into both nostrils daily.  . furosemide (LASIX) 40 MG tablet Take 40 mg by mouth daily. For fluid retention  . gabapentin (NEURONTIN) 300 MG capsule Take 300 mg by mouth 3 (three) times daily.   . insulin lispro (HUMALOG) 100 UNIT/ML injection Inject 10-16 Units into the skin 3 (three) times daily before meals.  Marland Kitchen levothyroxine (SYNTHROID, LEVOTHROID) 150 MCG tablet Take 150 mcg by mouth daily.    Marland Kitchen losartan (COZAAR) 50 MG tablet Take 1 tablet (50 mg total) by mouth daily. Please schedule appointment for refills.  . Menthol-Methyl Salicylate (MUSCLE  RUB) 10-15 % CREA Apply 1 application topically as needed (for pain in hands).  . metFORMIN (GLUCOPHAGE) 500 MG tablet Take 1 tablet (500 mg total) by mouth 2 (two) times daily with a meal.  . Multiple Vitamin (MULTIVITAMIN WITH MINERALS) TABS tablet Take 1 tablet by mouth daily.  . nitroGLYCERIN (NITROSTAT) 0.4 MG SL tablet Place 1 tablet (0.4 mg total) under the tongue every 5 (five) minutes as needed for chest pain.  . pantoprazole (PROTONIX) 40 MG tablet Take 40 mg by mouth 2 (two) times daily.   . potassium chloride (K-DUR,KLOR-CON) 10 MEQ tablet Take 1 tablet by mouth daily.  . pravastatin (PRAVACHOL) 80 MG tablet Take 80 mg by mouth at bedtime.  Nathen May SOLOSTAR 300 UNIT/ML SOPN Inject 50 Units as directed at bedtime.  . traMADol (ULTRAM) 50 MG tablet Take 50 mg by mouth 3 (three) times daily.     No facility-administered  encounter medications on file as of 10/11/2015.   ALLERGIES: No Known Allergies VACCINATION STATUS: Immunization History  Administered Date(s) Administered  . Influenza Split 04/11/2011, 02/03/2013  . Influenza Whole 01/23/2012, 02/08/2014  . Pneumococcal Polysaccharide-23 11/30/2010    Diabetes She presents for her follow-up diabetic visit. She has type 2 diabetes mellitus. Onset time: She was diagnosed at approximate age of 42 years. Her disease course has been worsening. There are no hypoglycemic associated symptoms. Pertinent negatives for hypoglycemia include no confusion, headaches, pallor or seizures. Associated symptoms include fatigue. Pertinent negatives for diabetes include no chest pain, no polydipsia, no polyphagia and no polyuria. There are no hypoglycemic complications. Symptoms are worsening. Diabetic complications include heart disease. Risk factors for coronary artery disease include diabetes mellitus, dyslipidemia, family history, hypertension, female sex, obesity and sedentary lifestyle. Current diabetic treatment includes insulin injections and oral agent (monotherapy). She is compliant with treatment most of the time. Her weight is decreasing steadily (She lost 14 pounds overall.). She is following a generally unhealthy diet. When asked about meal planning, she reported none. She has not had a previous visit with a dietitian. She never participates in exercise. Her breakfast blood glucose range is generally 180-200 mg/dl. Her lunch blood glucose range is generally 180-200 mg/dl. Her dinner blood glucose range is generally 180-200 mg/dl. Her overall blood glucose range is 180-200 mg/dl. An ACE inhibitor/angiotensin II receptor blocker is being taken.  Hyperlipidemia This is a chronic problem. The current episode started more than 1 year ago. Pertinent negatives include no chest pain, myalgias or shortness of breath. Current antihyperlipidemic treatment includes statins. Risk factors  for coronary artery disease include diabetes mellitus, dyslipidemia, hypertension, female sex and a sedentary lifestyle.  Hypertension This is a chronic problem. The current episode started more than 1 year ago. Pertinent negatives include no chest pain, headaches, palpitations or shortness of breath. Risk factors for coronary artery disease include dyslipidemia, diabetes mellitus, obesity and sedentary lifestyle. Past treatments include angiotensin blockers. Hypertensive end-organ damage includes CAD/MI.      Review of Systems  Constitutional: Positive for fatigue. Negative for fever, chills and unexpected weight change.  HENT: Negative for trouble swallowing and voice change.   Eyes: Negative for visual disturbance.  Respiratory: Negative for cough, shortness of breath and wheezing.   Cardiovascular: Negative for chest pain, palpitations and leg swelling.  Gastrointestinal: Negative for nausea, vomiting and diarrhea.  Endocrine: Negative for cold intolerance, heat intolerance, polydipsia, polyphagia and polyuria.  Musculoskeletal: Negative for myalgias and arthralgias.  Skin: Negative for color change, pallor, rash and wound.  Neurological: Negative for seizures and headaches.  Psychiatric/Behavioral: Negative for suicidal ideas and confusion.    Objective:    BP 142/74 mmHg  Pulse 54  Ht  (1.676 m)  Wt 272 lb (123.378 kg)  BMI 43.92 kg/m2  SpO2 95%  Wt Readings from Last 3 Encounters:  10/11/15 272 lb (123.378 kg)  08/16/15 274 lb (124.286 kg)  07/29/15 277 lb (125.646 kg)    Physical Exam  Constitutional: She is oriented to person, place, and time. She appears well-developed.  HENT:  Head: Normocephalic and atraumatic.  Eyes: EOM are normal.  Neck: Normal range of motion. Neck supple. No tracheal deviation present. No thyromegaly present.  Cardiovascular: Normal rate and regular rhythm.   Pulmonary/Chest: Effort normal and breath sounds normal.  Abdominal: Soft. Bowel  sounds are normal. There is no tenderness. There is no guarding.  Colostomy bag in place.  Musculoskeletal: Normal range of motion. She exhibits no edema.  Neurological: She is alert and oriented to person, place, and time. She has normal reflexes. No cranial nerve deficit. Coordination normal.  Skin: Skin is warm and dry. No rash noted. No erythema. No pallor.  Psychiatric: She has a normal mood and affect. Judgment normal.     CMP ( most recent) CMP     Component Value Date/Time   NA 138 10/01/2015 0936   NA 144 07/25/2013 2031   K 4.1 10/01/2015 0936   CL 101 10/01/2015 0936   CO2 22 10/01/2015 0936   GLUCOSE 183* 10/01/2015 0936   GLUCOSE 134* 07/25/2013 2031   BUN 9 10/01/2015 0936   BUN 9 07/25/2013 2031   CREATININE 1.30* 10/01/2015 0936   CALCIUM 9.3 10/01/2015 0936   PROT 6.6 07/25/2013 2031   ALBUMIN 3.1* 07/25/2013 2031   AST 24 07/25/2013 2031   ALT 24 07/25/2013 2031   ALKPHOS 73 07/25/2013 2031   BILITOT 0.4 07/25/2013 2031   GFRNONAA 43* 10/01/2015 0936   GFRAA 49* 10/01/2015 0936   Her most recent A1c  Has increased to 9.2% from  8.2%   Assessment & Plan:   1. DM type 2 causing vascular disease (HCC)  - Patient has currently uncontrolled symptomatic type 2 DM since  70 years of age,  with most recent A1c of  9.2% worsening from 8.2 %. Recent labs reviewed. -She came with significantly above target blood glucose profile  largely due to due to failure to cover her meals properly with Humalog.    Her diabetes is complicated by coronary artery disease status post stent placement and mild renal insufficiency and patient remains at a high risk for more acute and chronic complications of diabetes which include CAD, CVA, CKD, retinopathy, and neuropathy. These are all discussed in detail with the patient.  - I have counseled the patient on diet management and weight loss, by adopting a carbohydrate restricted/protein rich diet.  - Suggestion is made for patient  to avoid simple carbohydrates   from their diet including Cakes , Desserts, Ice Cream,  Soda (  diet and regular) , Sweet Tea , Candies,  Chips, Cookies, Artificial Sweeteners,   and "Sugar-free" Products . This will help patient to have stable blood glucose profile and potentially avoid unintended weight gain.  - I encouraged the patient to switch to  unprocessed or minimally processed complex starch and increased protein intake (animal or plant source), fruits, and vegetables.  - Patient is advised to stick to a routine mealtimes to eat 3 meals  a  day and avoid unnecessary snacks ( to snack only to correct hypoglycemia).  - The patient will be scheduled with Norm SaltPenny Crumpton, RDN, CDE for individualized DM education.  - I have approached patient with the following individualized plan to manage diabetes and patient agrees:   - I  will continue  Toujeo  50 units QHS, and increase prandial insulin Humalog to 10 units TIDAC for pre-meal BG readings of 90-150mg /dl, plus patient specific correction dose for unexpected hyperglycemia above 150mg /dl, associated with strict monitoring of glucose  AC and HS. - Patient is warned not to take insulin without proper monitoring per orders. -Adjustment parameters are given for hypo and hyperglycemia in writing. -Patient is encouraged to call clinic for blood glucose levels less than 70 or above 300 mg /dl. - I will continue metformin at low-dose 500 mg by mouth twice a day, therapeutically suitable for patient..  -Patient is not a candidate for SGLT2 inhibitors due to CKD.  - Patient will be considered for incretin therapy as appropriate next visit. - Patient specific target  A1c;  LDL, HDL, Triglycerides, and  Waist Circumference were discussed in detail.  2) BP/HTN: Controlled. Continue current medications including ACEI/ARB. 3) Lipids/HPL:  Controlled, continue statins. 4)  Weight/Diet: CDE Consult will be initiated , exercise, and detailed carbohydrates  information provided.  5) Hypothyroidism, unspecified hypothyroidism type -No recent thyroid function tests. I advised her to continue levothyroxine 150 g by mouth every morning for now.  - We discussed about correct intake of levothyroxine, at fasting, with water, separated by at least 30 minutes from breakfast, and separated by more than 4 hours from calcium, iron, multivitamins, acid reflux medications (PPIs). -Patient is made aware of the fact that thyroid hormone replacement is needed for life, dose to be adjusted by periodic monitoring of thyroid function tests.  6) Chronic Care/Health Maintenance:  -Patient is on ACEI/ARB and Statin medications and encouraged to continue to follow up with Ophthalmology, Podiatrist at least yearly or according to recommendations, and advised to   stay away from smoking. I have recommended yearly flu vaccine and pneumonia vaccination at least every 5 years; moderate intensity exercise for up to 150 minutes weekly; and  sleep for at least 7 hours a day.  - 25 minutes of time was spent on the care of this patient , 50% of which was applied for counseling on diabetes complications and their preventions.  - Patient to bring meter and  blood glucose logs during their next visit.   - I advised patient [facility] for primary care needs.  Follow up plan: - Return in about 3 months (around 01/11/2016) for diabetes, high blood pressure, high cholesterol, underactive thyroid, follow up with pre-visit labs, meter, and logs.  Marquis LunchGebre Asmar Brozek, MD Phone: [phone]  Fax: [phone]   10/11/2015, 2:36 PM

## 2015-10-11 NOTE — Patient Instructions (Signed)

## 2015-10-11 NOTE — Progress Notes (Signed)
  Medical Nutrition Therapy:  Appt start time: 1530 end time:  1600   Assessment:  Primary concerns today: Diabetes.  Type 2. Lost 2 lbs.  BS log and meter brought. BS reading missing often. Often skips insuln and meal at lunch time. Falls asleep at night and forgets Toujeo at night. Suppose to take 60 units of Toujeo and 10 units of Humalog plus sliding scale with meals. A1C increased from 8.2 to 9.2%. She has a ileostomyy/colostomy bag and this limits her ability and desire to go out due to problems with her bag. She  Admits to is caring for a few family members and often time neglecting her own needs in order to take care of their needs. She needs to be more compliant with meals and medications. Doesn't exercise due to ostomy bag.  Lab Results  Component Value Date   HGBA1C 9.2* 10/01/2015    Preferred Learning Style:   Auditory  Visual  Hands on  No preference indicated   Learning Readiness:   Ready  Change in progress   MEDICATIONS: see list   DIETARY INTAKE:  24-hr recall:  B ( AM): Cherrios, 2% milk OR glass of milk OR eggs, toast,  fruit Snk ( AM): none L ( PM):   Snk ( PM):none D ( PM): Chicken, coleslaw and mac/cheese, water OR Ham, buttered potaotes, green beans, peas, water Snk ( PM):  Beverages: Water,  Usual physical activity: ADL  Estimated energy needs: 1500 calories 170 g carbohydrates 112 g protein 42 g fat  Progress Towards Goal(s):  In progress.   Nutritional Diagnosis:  NB-1.1 Food and nutrition-related knowledge deficit As related to Diabetes.  As evidenced by A1C 8.2%..    Intervention:  Nutrition and Diabetes education provided on My Plate, CHO counting, meal planning, portion sizes, timing of meals, avoiding snacks between meals unless having a low blood sugar, target ranges for A1C and blood sugars, signs/symptoms and treatment of hyper/hypoglycemia, monitoring blood sugars, taking medications as prescribed, benefits of exercising 30  minutes per day and prevention of complications of DM. Goals 1. Follow Plate Method 2. Eat three meals per meals per day. Be sure test blood sugars betore meals Use sliding scale insulin before meals Don't skip meals Take 60 units of Toujeo at 9 pm. 3. Increase fresh fruiit and low carb vegetables. 5. Get A1C down to 8% 6. Lose 1 lbs per week.   Teaching Method Utilized:  Visual Auditory Hands on  Handouts given during visit include:  The Plate Method  Meal plan Card  Diabetes Instructions .  Barriers to learning/adherence to lifestyle change:  None  Demonstrated degree of understanding via:  Teach Back   Monitoring/Evaluation:  Dietary intake, exercise, meal planning, SBG, and body weight in 1 month(s).

## 2015-10-18 ENCOUNTER — Other Ambulatory Visit: Payer: Self-pay | Admitting: "Endocrinology

## 2015-11-10 ENCOUNTER — Encounter: Payer: Medicare Other | Admitting: Nutrition

## 2015-11-17 ENCOUNTER — Encounter: Payer: Self-pay | Admitting: Nutrition

## 2015-11-17 ENCOUNTER — Encounter: Payer: Medicare Other | Attending: "Endocrinology | Admitting: Nutrition

## 2015-11-17 VITALS — Ht 68.0 in | Wt 265.0 lb

## 2015-11-17 DIAGNOSIS — Z794 Long term (current) use of insulin: Secondary | ICD-10-CM

## 2015-11-17 DIAGNOSIS — E1159 Type 2 diabetes mellitus with other circulatory complications: Secondary | ICD-10-CM | POA: Diagnosis not present

## 2015-11-17 DIAGNOSIS — E118 Type 2 diabetes mellitus with unspecified complications: Secondary | ICD-10-CM

## 2015-11-17 DIAGNOSIS — IMO0002 Reserved for concepts with insufficient information to code with codable children: Secondary | ICD-10-CM

## 2015-11-17 DIAGNOSIS — E669 Obesity, unspecified: Secondary | ICD-10-CM

## 2015-11-17 DIAGNOSIS — E1165 Type 2 diabetes mellitus with hyperglycemia: Secondary | ICD-10-CM

## 2015-11-17 NOTE — Progress Notes (Signed)
  Medical Nutrition Therapy:  Appt start time: 1530 end time:  1600   Assessment:  Primary concerns today: Diabetes.  Type 2.  Lost 7 lbs since last visit. Has been cutting out a lot snacks and junk food.  Testing blood sugars 4 times per day. 60 units of Toujeo and Humalog 10 units with meals plus sliding scale. She has been confused about her meal time insulin and hasn't been taking it as instructed. Doing some walking. BS are much better. Diet needs more lower carb vegetables and consistent carb intake with meals. Referred to food pantry due to financial needs. Referred to [facility] for help with medications and [facility].  Consulted with Dr. Fransico HimNIda with her BS readings. He reduced her Toujeo to 40 units and Humalog 5 units with meals plus sliding scale.    Lab Results  Component Value Date   HGBA1C 9.2* 10/01/2015    Preferred Learning Style:   Auditory  Visual  Hands on  No preference indicated   Learning Readiness:   Ready  Change in progress   MEDICATIONS: see list   DIETARY INTAKE:  24-hr recall:  B ( AM): Boiled egg and toast, coffee Snk ( AM): none L ( PM):  [place] sandwich on ww bread, water. 1 cup watermelon Snk ( PM):none D ( PM): Fish cake, watermelon, water  Snk ( PM):  Beverages: Water,  Usual physical activity: ADL  Estimated energy needs: 1500 calories 170 g carbohydrates 112 g protein 42 g fat  Progress Towards Goal(s):  In progress.   Nutritional Diagnosis:  NB-1.1 Food and nutrition-related knowledge deficit As related to Diabetes.  As evidenced by A1C 8.2%..    Intervention:  Nutrition and Diabetes education provided on My Plate, CHO counting, meal planning, portion sizes, timing of meals, avoiding snacks between meals unless having a low blood sugar, target ranges for A1C and blood sugars, signs/symptoms and treatment of hyper/hypoglycemia, monitoring blood sugars, taking medications as prescribed, benefits of  exercising 30 minutes per day and prevention of complications of DM. Marland Kitchen.  Goals 1. Follow My Plate 2. Follow insulin sliding schedule as discussed.  Take 40 units of Toujeo at night and 10 units of Humalog plus sliding scale before meals. If you do not eat a meal, do not taking Humalog. 4. Walk 30 minutes daily. 5. IF BS is less than 70 2-3 times in a row, let call Dr. Fransico HimNIda. 6. Increase low carb vegetables. Teaching Method Utilized:  Visual Auditory Hands on  Handouts given during visit include:  The Plate Method  Meal plan Card  Diabetes Instructions .  Barriers to learning/adherence to lifestyle change:  None  Demonstrated degree of understanding via:  Teach Back   Monitoring/Evaluation:  Dietary intake, exercise, meal planning, SBG, and body weight in 1 month(s).

## 2015-11-17 NOTE — Patient Instructions (Addendum)
Goals 1. Follow My Plate 2. Follow insulin sliding schedule as discussed.  Take 40 units of Toujeo at night and 10 units of Humalog plus sliding scale before meals. If you do not eat a meal, do not taking Humalog. 4. Walk 30 minutes daily. 5. IF BS is less than 70 2-3 times in a row, let call Dr. Fransico HimNIda. 6. Increase low carb vegetables.

## 2015-11-25 ENCOUNTER — Encounter: Payer: Self-pay | Admitting: Internal Medicine

## 2015-11-25 ENCOUNTER — Ambulatory Visit (INDEPENDENT_AMBULATORY_CARE_PROVIDER_SITE_OTHER): Payer: Medicare Other | Admitting: Internal Medicine

## 2015-11-25 VITALS — BP 102/58 | HR 62 | Ht 68.0 in | Wt 264.2 lb

## 2015-11-25 DIAGNOSIS — K219 Gastro-esophageal reflux disease without esophagitis: Secondary | ICD-10-CM

## 2015-11-25 DIAGNOSIS — R1310 Dysphagia, unspecified: Secondary | ICD-10-CM | POA: Diagnosis not present

## 2015-11-25 NOTE — Patient Instructions (Signed)
You have been scheduled for a Barium Esophogram at [facility] (1st floor of the hospital) on 12/03/2015 at 10:30am. Please arrive 15 minutes prior to your appointment for registration. Make certain not to have anything to eat or drink after midnight prior to your test. If you need to reschedule for any reason, please contact radiology at [phone] to do so. __________________________________________________________________ A barium swallow is an examination that concentrates on views of the esophagus. This tends to be a double contrast exam (barium and two liquids which, when combined, create a gas to distend the wall of the oesophagus) or single contrast (non-ionic iodine based). The study is usually tailored to your symptoms so a good history is essential. Attention is paid during the study to the form, structure and configuration of the esophagus, looking for functional disorders (such as aspiration, dysphagia, achalasia, motility and reflux) EXAMINATION You may be asked to change into a gown, depending on the type of swallow being performed. A radiologist and radiographer will perform the procedure. The radiologist will advise you of the type of contrast selected for your procedure and direct you during the exam. You will be asked to stand, sit or lie in several different positions and to hold a small amount of fluid in your mouth before being asked to swallow while the imaging is performed .In some instances you may be asked to swallow barium coated marshmallows to assess the motility of a solid food bolus. The exam can be recorded as a digital or video fluoroscopy procedure. POST PROCEDURE It will take 1-2 days for the barium to pass through your system. To facilitate this, it is important, unless otherwise directed, to increase your fluids for the next 24-48hrs and to resume your normal diet.  This test typically takes about 30 minutes to  perform. __________________________________________________________________________________    Jenna Diaz have been scheduled for an endoscopy. Please follow written instructions given to you at your visit today. If you use inhalers (even only as needed), please bring them with you on the day of your procedure.

## 2015-11-25 NOTE — Progress Notes (Signed)
HISTORY OF PRESENT ILLNESS:  Jenna Diaz is a 70 y.o. female with multiple medical problems who is referred by her primary care physician Dr. Sherryll Burger with chief complaint of dysphagia. Patient has prior history of total colectomy with ileostomy elsewhere (question cause) remotely. I have seen the patient for gastric ulcer. Last evaluation February 2013. Ulcer healing demonstrated. She also underwent ileoscopy for Hemoccult-positive stool. This was normal. Chief complaint today is that of dysphagia. It's bit vague but I think she is describing intermittent solid food dysphagia she points to the cervical esophagus as well as midesophagus. Occasional heartburn and indigestion. She does take pantoprazole which helps. She is on diabetic medications. She does report 15 pound weight loss over the past 3 months which she attributes to her diabetic medications. No abdominal pain. Laboratories from June 2017 revealed hemoglobin A1c of 9.2.  REVIEW OF SYSTEMS:  All non-GI ROS negative except for back pain, cough, night sweats, headaches, voice change, ankle swelling, shortness of breath  Past Medical History:  Diagnosis Date  . Anemia   . Anxiety   . Bowel obstruction (HCC) 1997  . Bursitis   . CAD (coronary artery disease)    A. 11/2010 - Cath: 60% LAD, 50% RCA ;  B. s/p Promus DES to LAD (by FFR 12/12);  RCA ok by FFR 12/12  . Carpal tunnel syndrome   . Cholelithiasis   . Chronic kidney disease    questionable  . Diabetes mellitus   . Esophageal stricture   . Essential hypertension   . Ganglion cyst   . Gastric ulcer   . Gastroparesis   . GERD (gastroesophageal reflux disease)    PUD with UGI bleed in 8/12 dx by EGD  . Hyperlipemia   . Hypothyroidism   . Osteoarthritis   . Peripheral vascular disease (HCC)   . Shortness of breath   . Sleep apnea    CPAP    Past Surgical History:  Procedure Laterality Date  . ABDOMINAL HYSTERECTOMY  1970's  . COLON RESECTION  1990's  . COLON SURGERY      COLONECTOMY WEARS A BAG  . DILATION AND CURETTAGE OF UTERUS     "before hysterectomy"  . GASTROSTOMY W/ FEEDING TUBE  1990's  . HAND SURGERY     cyst on left hand  . LEFT AND RIGHT HEART CATHETERIZATION WITH CORONARY ANGIOGRAM N/A 04/28/2011   Procedure: LEFT AND RIGHT HEART CATHETERIZATION WITH CORONARY ANGIOGRAM;  Surgeon: Tonny Bollman, MD;  Location: [facility] CATH LAB;  Service: Cardiovascular;  Laterality: N/A;  . NISSEN FUNDOPLICATION     questionable - pt unsure of surgical name    Social History Jenna Diaz  reports that she has never smoked. She has never used smokeless tobacco. She reports that she does not drink alcohol or use drugs.  family history includes Diabetes in her sister; Esophageal cancer in her father; Heart disease in her brother, maternal grandmother, mother, and paternal grandmother; Ovarian cancer in her sister.  No Known Allergies     PHYSICAL EXAMINATION: Vital signs: BP (!) 102/58   Pulse 62   Ht 5\' 8"  (1.727 m)   Wt 264 lb 4 oz (119.9 kg)   BMI 40.18 kg/m   Constitutional: Pleasant, obese, otherwise generally well-appearing, no acute distress Psychiatric: alert and oriented x3, cooperative Eyes: extraocular movements intact, anicteric, conjunctiva pink Mouth: oral pharynx moist, no lesions. No thrush Neck: supple without thyromegaly Lymph: no lymphadenopathy Cardiovascular: heart regular rate and rhythm, no murmur Lungs: clear  to auscultation bilaterally Abdomen: soft, obese, nontender, nondistended, no obvious ascites, no peritoneal signs, normal bowel sounds, no organomegaly. Unremarkable ostomy bag in the right midabdomen Rectal: Omitted Extremities: no clubbing or cyanosis. Trace lower extremity edema bilaterally Skin: no lesions on visible extremities Neuro: No focal deficits. Cranial nerves intact  ASSESSMENT:  #1. Dysphagia. Probably intermittent solid food most likely. Rule out stricture #2. GERD. Symptoms helped with PPI #3.  History of gastric ulcer followed healing 2013 #4. Total colectomy with ileostomy remotely elsewhere #5. Multiple medical problems including diabetes   PLAN:  #1. Reflux precautions with attention to weight loss #2. Continue PPI #3. Schedule barium swallow with tablet. We will contact her with results when available #4. Schedule upper endoscopy with possible esophageal dilation. High-risk given body habitus and comorbidities. Also the need to adjust diabetic medications.The nature of the procedure, as well as the risks, benefits, and alternatives were carefully and thoroughly reviewed with the patient. Ample time for discussion and questions allowed. The patient understood, was satisfied, and agreed to proceed. #5. Adjust diabetic medications for the procedure. She has been advised #6. Ongoing general medical care Dr. Sherryll Burger

## 2015-12-03 ENCOUNTER — Ambulatory Visit (HOSPITAL_COMMUNITY)
Admission: RE | Admit: 2015-12-03 | Discharge: 2015-12-03 | Disposition: A | Discharge status: Home / Self Care | Payer: Medicare Other | Source: Ambulatory Visit | Attending: Internal Medicine | Admitting: Internal Medicine

## 2015-12-03 DIAGNOSIS — K224 Dyskinesia of esophagus: Secondary | ICD-10-CM | POA: Insufficient documentation

## 2015-12-03 DIAGNOSIS — K3189 Other diseases of stomach and duodenum: Secondary | ICD-10-CM | POA: Insufficient documentation

## 2015-12-03 DIAGNOSIS — R1310 Dysphagia, unspecified: Secondary | ICD-10-CM

## 2015-12-14 ENCOUNTER — Ambulatory Visit (AMBULATORY_SURGERY_CENTER): Payer: Medicare Other | Admitting: Internal Medicine

## 2015-12-14 ENCOUNTER — Encounter: Payer: Self-pay | Admitting: Internal Medicine

## 2015-12-14 VITALS — BP 124/64 | HR 44 | Temp 96.9°F | Resp 10 | Ht 68.0 in | Wt 264.0 lb

## 2015-12-14 DIAGNOSIS — R1310 Dysphagia, unspecified: Secondary | ICD-10-CM | POA: Diagnosis not present

## 2015-12-14 DIAGNOSIS — K317 Polyp of stomach and duodenum: Secondary | ICD-10-CM

## 2015-12-14 LAB — GLUCOSE, CAPILLARY
Glucose-Capillary: 168 mg/dL — ABNORMAL HIGH (ref 65–99)
Glucose-Capillary: 131 mg/dL — ABNORMAL HIGH (ref 65–99)

## 2015-12-14 MED ORDER — SODIUM CHLORIDE 0.9 % IV SOLN: 500.0000 mL | INTRAVENOUS | Status: AC

## 2015-12-14 NOTE — Progress Notes (Signed)
Patient awakening,vss,report to rn 

## 2015-12-14 NOTE — Op Note (Signed)
[facility] Patient Name: Jenna Diaz Procedure Date: 12/14/2015 10:02 AM MRN: [id number] Endoscopist: Wilhemina Bonito. Marina Goodell , MD Age: 70 Referring MD:  Date of Birth: [birth year] Gender: Female Account #: [id number] Procedure:                Upper GI endoscopy, with biopsies and Maloney                            dilation of the esophagus 50 f Indications:              Dysphagia, Abnormal UGI series(reported duodenal                            polyp) Medicines:                Monitored Anesthesia Care Procedure:                Pre-Anesthesia Assessment:                           - Prior to the procedure, a History and Physical                            was performed, and patient medications and                            allergies were reviewed. The patient's tolerance of                            previous anesthesia was also reviewed. The risks                            and benefits of the procedure and the sedation                            options and risks were discussed with the patient.                            All questions were answered, and informed consent                            was obtained. Prior Anticoagulants: The patient has                            taken no previous anticoagulant or antiplatelet                            agents. ASA Grade Assessment: III - A patient with                            severe systemic disease. After reviewing the risks                            and benefits, the patient was deemed in  satisfactory condition to undergo the procedure.                           After obtaining informed consent, the endoscope was                            passed under direct vision. Throughout the                            procedure, the patient's blood pressure, pulse, and                            oxygen saturations were monitored continuously. The                            Model [id number]-[id number]) scope  was introduced                            through the mouth, and advanced to the second part                            of duodenum. The upper GI endoscopy was                            accomplished without difficulty. The patient                            tolerated the procedure well. Scope In: Scope Out: Findings:                 No endoscopic abnormality was evident in the                            esophagus to explain the patient's complaint of                            dysphagia. It was decided, however, to proceed with                            dilation. The scope was withdrawn, after completing                            the endoscopic survey and performing biopsies.                            Dilation was performed with a Maloney dilator with                            no resistance at 50 Fr.                           Multiple 5 mm inflammatory appearing pedunculated                            and sessile polyps with  no stigmata of recent                            bleeding were found in the prepyloric region of the                            stomach. Biopsies were taken with a cold forceps                            for histology.                           A single friable inflammatory appearing 30 mm                            pedunculated polyp with no stigmata of recent                            bleeding was found in the gastric body. Biopsies                            were taken with a cold forceps for histology.                           The exam of the stomach was otherwise normal.                           The examined duodenum was normal. No duodenal polyp.                           The cardia and gastric fundus were normal on                            retroflexion. Complications:            No immediate complications. Estimated Blood Loss:     Estimated blood loss: none. Impression:               - No endoscopic esophageal abnormality to explain                             patient's dysphagia. Esophagus dilated.                           - Multiple gastric polyps. Biopsied.                           - A single gastric polyp. Biopsied.                           - Normal examined duodenum. Recommendation:           - Patient has a contact number available for                            emergencies. The signs and symptoms of potential  delayed complications were discussed with the                            patient. Return to normal activities tomorrow.                            Written discharge instructions were provided to the                            patient.                           - Resume previous diet.                           - Continue present medications.                           - Await pathology results. We will contact you with                            the results and further recommendations. Wilhemina BonitoJohn N. Marina GoodellPerry, MD 12/14/2015 10:20:31 AM This report has been signed electronically.

## 2015-12-14 NOTE — Progress Notes (Signed)
Called to room to assist during endoscopic procedure.  Patient ID and intended procedure confirmed with present staff. Received instructions for my participation in the procedure from the performing physician.  

## 2015-12-14 NOTE — Patient Instructions (Signed)
YOU HAD AN ENDOSCOPIC PROCEDURE TODAY AT THE [facility]:   Refer to the procedure report that was given to you for any specific questions about what was found during the examination.  If the procedure report does not answer your questions, please call your gastroenterologist to clarify.  If you requested that your care partner not be given the details of your procedure findings, then the procedure report has been included in a sealed envelope for you to review at your convenience later.  YOU SHOULD EXPECT: Some feelings of bloating in the abdomen. Passage of more gas than usual.  Walking can help get rid of the air that was put into your GI tract during the procedure and reduce the bloating. If you had a lower endoscopy (such as a colonoscopy or flexible sigmoidoscopy) you may notice spotting of blood in your stool or on the toilet paper. If you underwent a bowel prep for your procedure, you may not have a normal bowel movement for a few days.  Please Note:  You might notice some irritation and congestion in your nose or some drainage.  This is from the oxygen used during your procedure.  There is no need for concern and it should clear up in a day or so.  SYMPTOMS TO REPORT IMMEDIATELY:   Following lower endoscopy (colonoscopy or flexible sigmoidoscopy):  Excessive amounts of blood in the stool  Significant tenderness or worsening of abdominal pains  Swelling of the abdomen that is new, acute  Fever of 100F or higher   Following upper endoscopy (EGD)  Vomiting of blood or coffee ground material  New chest pain or pain under the shoulder blades  Painful or persistently difficult swallowing  New shortness of breath  Fever of 100F or higher  Black, tarry-looking stools  For urgent or emergent issues, a gastroenterologist can be reached at any hour by calling [phone].   DIET:  We do recommend a small meal at first, but then you may proceed to your regular diet.  Drink  plenty of fluids but you should avoid alcoholic beverages for 24 hours.  ACTIVITY:  You should plan to take it easy for the rest of today and you should NOT DRIVE or use heavy machinery until tomorrow (because of the sedation medicines used during the test).    FOLLOW UP: Our staff will call the number listed on your records the next business day following your procedure to check on you and address any questions or concerns that you may have regarding the information given to you following your procedure. If we do not reach you, we will leave a message.  However, if you are feeling well and you are not experiencing any problems, there is no need to return our call.  We will assume that you have returned to your regular daily activities without incident.  If any biopsies were taken you will be contacted by phone or by letter within the next 1-3 weeks.  Please call us at [phone] if you have not heard about the biopsies in 3 weeks.    SIGNATURES/CONFIDENTIALITY: You and/or your care partner have signed paperwork which will be entered into your electronic medical record.  These signatures attest to the fact that that the information above on your After Visit Summary has been reviewed and is understood.  Full responsibility of the confidentiality of this discharge information lies with you and/or your care-partner.  Post dilation diet given.  Await biopsy results

## 2015-12-15 ENCOUNTER — Telehealth: Payer: Self-pay

## 2015-12-15 NOTE — Telephone Encounter (Signed)
  Follow up Call-  Call back number 12/14/2015  Post procedure Call Back phone  # [phone]  Permission to leave phone message Yes  Some recent data might be hidden    Patient was called for follow up after her procedure on 12/14/2015. No answer at the number given for follow up phone call. A message was left on the answering machine.

## 2015-12-20 ENCOUNTER — Encounter: Payer: Self-pay | Admitting: Internal Medicine

## 2015-12-24 ENCOUNTER — Telehealth: Payer: Self-pay

## 2015-12-24 ENCOUNTER — Telehealth: Payer: Self-pay | Admitting: Internal Medicine

## 2015-12-24 ENCOUNTER — Other Ambulatory Visit: Payer: Self-pay

## 2015-12-24 DIAGNOSIS — K317 Polyp of stomach and duodenum: Secondary | ICD-10-CM

## 2015-12-24 NOTE — Telephone Encounter (Signed)
See additional phone note. 

## 2015-12-24 NOTE — Telephone Encounter (Signed)
-----   Message from Hilarie FredricksonJohn N Perry, MD sent at 12/20/2015  1:24 PM EDT ----- Regarding: [facility] J[facility], Please contact this patient and let her know that her gastric biopsies were benign polyps. However, the largest polyp should be removed in order to avoid any risk of cancer down the road. This should be done at the hospital with MAC/propofol. Utilize one half day slots. Thanks. Also, she will be receiving a letter from me.

## 2015-12-24 NOTE — Telephone Encounter (Signed)
[facility]@10 :30am. Pt to arrive there at 9am. Prep instructions mailed to pt and she is aware of appt.

## 2015-12-31 ENCOUNTER — Ambulatory Visit: Payer: Medicare Other | Admitting: Nutrition

## 2016-01-11 ENCOUNTER — Ambulatory Visit: Payer: Medicare Other | Admitting: "Endocrinology

## 2016-01-12 ENCOUNTER — Ambulatory Visit: Payer: Medicare Other | Admitting: "Endocrinology

## 2016-01-17 ENCOUNTER — Encounter (HOSPITAL_COMMUNITY): Payer: Self-pay | Admitting: *Deleted

## 2016-01-18 ENCOUNTER — Encounter (HOSPITAL_COMMUNITY): Payer: Self-pay

## 2016-01-18 ENCOUNTER — Ambulatory Visit (HOSPITAL_COMMUNITY): Payer: Medicare Other | Admitting: Anesthesiology

## 2016-01-18 ENCOUNTER — Ambulatory Visit (HOSPITAL_COMMUNITY)
Admission: RE | Admit: 2016-01-18 | Discharge: 2016-01-18 | Disposition: A | Discharge status: Home / Self Care | Payer: Medicare Other | Source: Ambulatory Visit | Attending: Internal Medicine | Admitting: Internal Medicine

## 2016-01-18 ENCOUNTER — Encounter (HOSPITAL_COMMUNITY)
Admission: RE | Disposition: A | Discharge status: Home / Self Care | Payer: Self-pay | Source: Ambulatory Visit | Attending: Internal Medicine

## 2016-01-18 DIAGNOSIS — E039 Hypothyroidism, unspecified: Secondary | ICD-10-CM | POA: Insufficient documentation

## 2016-01-18 DIAGNOSIS — E785 Hyperlipidemia, unspecified: Secondary | ICD-10-CM | POA: Diagnosis not present

## 2016-01-18 DIAGNOSIS — E1143 Type 2 diabetes mellitus with diabetic autonomic (poly)neuropathy: Secondary | ICD-10-CM | POA: Insufficient documentation

## 2016-01-18 DIAGNOSIS — Z9989 Dependence on other enabling machines and devices: Secondary | ICD-10-CM | POA: Diagnosis not present

## 2016-01-18 DIAGNOSIS — G4730 Sleep apnea, unspecified: Secondary | ICD-10-CM | POA: Diagnosis not present

## 2016-01-18 DIAGNOSIS — K317 Polyp of stomach and duodenum: Secondary | ICD-10-CM | POA: Diagnosis not present

## 2016-01-18 DIAGNOSIS — Z794 Long term (current) use of insulin: Secondary | ICD-10-CM | POA: Insufficient documentation

## 2016-01-18 DIAGNOSIS — I2510 Atherosclerotic heart disease of native coronary artery without angina pectoris: Secondary | ICD-10-CM | POA: Diagnosis not present

## 2016-01-18 DIAGNOSIS — K219 Gastro-esophageal reflux disease without esophagitis: Secondary | ICD-10-CM | POA: Diagnosis not present

## 2016-01-18 DIAGNOSIS — E1151 Type 2 diabetes mellitus with diabetic peripheral angiopathy without gangrene: Secondary | ICD-10-CM | POA: Diagnosis not present

## 2016-01-18 DIAGNOSIS — Z955 Presence of coronary angioplasty implant and graft: Secondary | ICD-10-CM | POA: Diagnosis not present

## 2016-01-18 DIAGNOSIS — Z79899 Other long term (current) drug therapy: Secondary | ICD-10-CM | POA: Diagnosis not present

## 2016-01-18 DIAGNOSIS — M1990 Unspecified osteoarthritis, unspecified site: Secondary | ICD-10-CM | POA: Diagnosis not present

## 2016-01-18 DIAGNOSIS — Z6841 Body Mass Index (BMI) 40.0 and over, adult: Secondary | ICD-10-CM | POA: Insufficient documentation

## 2016-01-18 DIAGNOSIS — K3189 Other diseases of stomach and duodenum: Secondary | ICD-10-CM

## 2016-01-18 DIAGNOSIS — I10 Essential (primary) hypertension: Secondary | ICD-10-CM | POA: Diagnosis not present

## 2016-01-18 DIAGNOSIS — K3184 Gastroparesis: Secondary | ICD-10-CM | POA: Insufficient documentation

## 2016-01-18 DIAGNOSIS — Z7982 Long term (current) use of aspirin: Secondary | ICD-10-CM | POA: Insufficient documentation

## 2016-01-18 HISTORY — PX: ESOPHAGOGASTRODUODENOSCOPY: SHX5428

## 2016-01-18 LAB — GLUCOSE, CAPILLARY
GLUCOSE-CAPILLARY: 186 mg/dL — AB (ref 65–99)
Glucose-Capillary: 198 mg/dL — ABNORMAL HIGH (ref 65–99)

## 2016-01-18 SURGERY — EGD (ESOPHAGOGASTRODUODENOSCOPY)
Anesthesia: Monitor Anesthesia Care

## 2016-01-18 MED ORDER — GLYCOPYRROLATE 0.2 MG/ML IV SOSY
PREFILLED_SYRINGE | INTRAVENOUS | Status: AC
  Filled 2016-01-18: qty 3

## 2016-01-18 MED ORDER — PROPOFOL 10 MG/ML IV BOLUS
INTRAVENOUS | Status: AC
  Filled 2016-01-18: qty 40

## 2016-01-18 MED ORDER — PROPOFOL 500 MG/50ML IV EMUL
INTRAVENOUS | Status: DC | PRN
  Administered 2016-01-18: 200 ug/kg/min via INTRAVENOUS

## 2016-01-18 MED ORDER — SODIUM CHLORIDE 0.9 % IV SOLN: INTRAVENOUS | Status: DC

## 2016-01-18 MED ORDER — GLYCOPYRROLATE 0.2 MG/ML IJ SOLN
INTRAMUSCULAR | Status: DC | PRN
  Administered 2016-01-18: 0.2 mg via INTRAVENOUS

## 2016-01-18 MED ORDER — PROPOFOL 10 MG/ML IV BOLUS
INTRAVENOUS | Status: AC
  Filled 2016-01-18: qty 20

## 2016-01-18 MED ORDER — ONDANSETRON HCL 4 MG/2ML IJ SOLN
INTRAMUSCULAR | Status: AC
  Filled 2016-01-18: qty 2

## 2016-01-18 MED ORDER — FENTANYL CITRATE (PF) 100 MCG/2ML IJ SOLN
INTRAMUSCULAR | Status: DC | PRN
  Administered 2016-01-18: 100 ug via INTRAVENOUS

## 2016-01-18 MED ORDER — LIDOCAINE 2% (20 MG/ML) 5 ML SYRINGE
INTRAMUSCULAR | Status: AC
  Filled 2016-01-18: qty 5

## 2016-01-18 MED ORDER — ONDANSETRON HCL 4 MG/2ML IJ SOLN
INTRAMUSCULAR | Status: DC | PRN
  Administered 2016-01-18: 4 mg via INTRAVENOUS

## 2016-01-18 MED ORDER — SODIUM CHLORIDE 0.9 % IJ SOLN
INTRAMUSCULAR | Status: DC | PRN
  Administered 2016-01-18: 3 mL

## 2016-01-18 MED ORDER — FENTANYL CITRATE (PF) 100 MCG/2ML IJ SOLN
INTRAMUSCULAR | Status: AC
  Filled 2016-01-18: qty 2

## 2016-01-18 MED ORDER — LACTATED RINGERS IV SOLN
INTRAVENOUS | Status: DC
Start: 2016-01-18 — End: 2016-01-18
  Administered 2016-01-18 (×2): via INTRAVENOUS

## 2016-01-18 MED ORDER — LIDOCAINE HCL (CARDIAC) 20 MG/ML IV SOLN
INTRAVENOUS | Status: DC | PRN
  Administered 2016-01-18: 100 mg via INTRAVENOUS

## 2016-01-18 NOTE — Op Note (Signed)
[facility] Patient Name: Jenna Diaz Procedure Date: 01/18/2016 MRN: [id number] Attending MD: Wilhemina Bonito. Marina Goodell , MD Date of Birth: 1946-01-14 CSN: [id number] Age: 70 Admit Type: Outpatient Procedure:                Upper GI endoscopy, with epinephrine injection,                            Endo Clip, hot snare polypectomy Indications:              Benign gastric tumor, For therapy of benign gastric                            tumor. 3 cm hyperplastic polyp Providers:                Wilhemina Bonito. Marina Goodell, MD, Janae Sauce. Steele Berg, RN, Clearnce Sorrel, Technician, Anastasio Champion, CRNA Referring MD:              Medicines:                Monitored Anesthesia Care Complications:            No immediate complications. Estimated Blood Loss:     Estimated blood loss was minimal. Procedure:                Pre-Anesthesia Assessment:                           - Prior to the procedure, a History and Physical                            was performed, and patient medications and                            allergies were reviewed. The patient's tolerance of                            previous anesthesia was also reviewed. The risks                            and benefits of the procedure and the sedation                            options and risks were discussed with the patient.                            All questions were answered, and informed consent                            was obtained. Prior Anticoagulants: The patient has                            taken no previous anticoagulant or antiplatelet  agents. ASA Grade Assessment: III - A patient with                            severe systemic disease. After reviewing the risks                            and benefits, the patient was deemed in                            satisfactory condition to undergo the procedure.                           After obtaining informed consent, the endoscope  was                            passed under direct vision. Throughout the                            procedure, the patient's blood pressure, pulse, and                            oxygen saturations were monitored continuously. The                            EG-[id number] ([id number]) scope was introduced through the                            mouth, and advanced to the second part of duodenum.                            The upper GI endoscopy was accomplished without                            difficulty. The patient tolerated the procedure                            well. Scope In: Scope Out: Findings:      The examined esophagus was normal.      A single 30 mm pedunculated polyp was found in the gastric body. Prior       to removal the stalk of the polyp was injected with 3 mL of epinephrine       in a concentration of 1-10,000. Subsequently 3 endoclips were placed       securely across the polypectomy stalk. The polyp was subsequently       removed with a hot snare. Resection and retrieval , with Lucina Mellow basket,       were complete. No postprocedure bleeding. One additional Endo Clip       placed across transected stalk.      Patchy nodular mucosa was found in the prepyloric region of the stomach.      The exam of the stomach was otherwise normal.      The examined duodenum was normal. Impression:               - Normal esophagus.                           -  A single gastric polyp. Resected and retrieved.                            See report for details.                           - Nodular mucosa in the prepyloric region of the                            stomach.                           - Normal examined duodenum. Moderate Sedation:      none Recommendation:           - Patient has a contact number available for                            emergencies. Watch for bleeding in particular. The                            signs and symptoms of potential delayed                             complications were discussed with the patient.                            Return to normal activities tomorrow. Written                            discharge instructions were provided to the patient.                           - Resume previous diet.                           - Continue present medications.                           - Await pathology results. Dr. Marina Goodell will send a                            letter with the results. Procedure Code(s):        --- Professional ---                           [phone], Esophagogastroduodenoscopy, flexible,                            transoral; with removal of tumor(s), polyp(s), or                            other lesion(s) by snare technique Diagnosis Code(s):        --- Professional ---                           K31.7, Polyp of  stomach and duodenum                           K31.89, Other diseases of stomach and duodenum                           D13.1, Benign neoplasm of stomach CPT copyright 2016 [employer]. All rights reserved. The codes documented in this report are preliminary and upon coder review may  be revised to meet current compliance requirements. Wilhemina BonitoJohn N. Marina GoodellPerry, MD 01/18/2016 11:54:29 AM This report has been signed electronically. Number of Addenda: 0

## 2016-01-18 NOTE — Anesthesia Postprocedure Evaluation (Signed)
Anesthesia Post Note  Patient: Jenna Diaz  Procedure(s) Performed: Procedure(s) (LRB): ESOPHAGOGASTRODUODENOSCOPY (EGD) (N/A)  Patient location during evaluation: Endoscopy Anesthesia Type: MAC Level of consciousness: awake and alert Pain management: pain level controlled Vital Signs Assessment: post-procedure vital signs reviewed and stable Respiratory status: spontaneous breathing, nonlabored ventilation, respiratory function stable and patient connected to nasal cannula oxygen Cardiovascular status: stable and blood pressure returned to baseline Anesthetic complications: no    Last Vitals:  Vitals:   01/18/16 1128 01/18/16 1135  BP: (!) 132/50   Pulse: 73   Resp: (!) 8   Temp:  36.6 C    Last Pain:  Vitals:   01/18/16 1135  TempSrc: Oral                 Cecile HearingStephen Edward Turk

## 2016-01-18 NOTE — Transfer of Care (Signed)
Immediate Anesthesia Transfer of Care Note  Patient: Donnetta SimpersFrances P Batton  Procedure(s) Performed: Procedure(s): ESOPHAGOGASTRODUODENOSCOPY (EGD) (N/A)  Patient Location: PACU  Anesthesia Type:MAC  Level of Consciousness: awake, alert , oriented and patient cooperative  Airway & Oxygen Therapy: Patient Spontanous Breathing and Patient connected to face mask oxygen  Post-op Assessment: Report given to RN, Post -op Vital signs reviewed and stable and Patient moving all extremities X 4  Post vital signs: stable  Last Vitals:  Vitals:   01/18/16 0957 01/18/16 1128  BP: (!) 132/52 (!) 132/50  Pulse: (!) 46 73  Resp: 12 (!) 8  Temp: 36.5 C     Last Pain:  Vitals:   01/18/16 1135  TempSrc: Oral         Complications: No apparent anesthesia complications

## 2016-01-18 NOTE — Discharge Instructions (Signed)

## 2016-01-18 NOTE — H&P (Signed)
Jenna Diaz is an 70 y.o. female.   Chief Complaint: Large hyperplastic gastric polyp HPI: Past medical history as listed below. He recently underwent upper endoscopy with esophageal dilation for dysphagia. Large pedunculated gastric polyp measuring 30 mm was identified and found to be hyperplastic. Also inflammatory erosive nodularity in the prepyloric region. She is now for polypectomy of the large polyp as there is a risk of cancerous transformation.  Past Medical History:  Diagnosis Date  . Anemia   . Anxiety   . Bowel obstruction (HCC) 1997  . Bursitis   . CAD (coronary artery disease)    A. 11/2010 - Cath: 60% LAD, 50% RCA ;  B. s/p Promus DES to LAD (by FFR 12/12);  RCA ok by FFR 12/12  . Carpal tunnel syndrome   . Cholelithiasis   . Chronic kidney disease    questionable  . Diabetes mellitus   . Esophageal stricture   . Essential hypertension   . Ganglion cyst   . Gastric ulcer   . Gastroparesis   . GERD (gastroesophageal reflux disease)    PUD with UGI bleed in 8/12 dx by EGD  . Hyperlipemia   . Hypertension   . Hypothyroidism   . Osteoarthritis   . Peripheral vascular disease (HCC)   . Shortness of breath    with exertion  . Sleep apnea    CPAP, pt does not knoe settings    Past Surgical History:  Procedure Laterality Date  . ABDOMINAL HYSTERECTOMY  1970's  . COLON RESECTION  1990's  . COLON SURGERY     COLONECTOMY WEARS A BAG  . DILATION AND CURETTAGE OF UTERUS     "before hysterectomy"  . GASTROSTOMY W/ FEEDING TUBE  1990's   later removed  . HAND SURGERY     cyst on left hand  . LEFT AND RIGHT HEART CATHETERIZATION WITH CORONARY ANGIOGRAM N/A 04/28/2011   Procedure: LEFT AND RIGHT HEART CATHETERIZATION WITH CORONARY ANGIOGRAM;  Surgeon: Tonny BollmanMichael Cooper, MD;  [facility];  Service: Cardiovascular;  Laterality: N/A;  . NISSEN FUNDOPLICATION     questionable - pt unsure of surgical name  . UPPER GASTROINTESTINAL ENDOSCOPY      Family  History  Problem Relation Age of Onset  . Heart disease Mother     MI  . Heart disease Maternal Grandmother   . Heart disease Paternal Grandmother   . Ovarian cancer Sister   . Diabetes Sister     x3  . Heart disease Brother   . Esophageal cancer Father   . Colon cancer Neg Hx   . Stomach cancer Neg Hx    Social History:  reports that she has never smoked. She has never used smokeless tobacco. She reports that she does not drink alcohol or use drugs.  Allergies: No Known Allergies  Facility-Administered Medications Prior to Admission  Medication Dose Route Frequency Provider Last Rate Last Dose  . 0.9 %  sodium chloride infusion  500 mL Intravenous Continuous Hilarie FredricksonJohn N Jearldine Cassady, MD       Medications Prior to Admission  Medication Sig Dispense Refill  . acetaminophen (TYLENOL) 500 MG tablet Take 1,000 mg by mouth every 6 (six) hours as needed for mild pain.     Marland Kitchen. albuterol (PROAIR HFA) 108 (90 BASE) MCG/ACT inhaler Inhale 2 puffs into the lungs every 6 (six) hours as needed for wheezing. 1 Inhaler 5  . amLODipine (NORVASC) 5 MG tablet Take 1 tablet (5 mg total) by mouth daily.  Please schedule appointment for refills. 30 tablet 0  . aspirin EC 81 MG tablet Take 81 mg by mouth daily.    Marland Kitchen escitalopram (LEXAPRO) 20 MG tablet Take 20 mg by mouth daily.   2  . Ferrous Sulfate Dried (FEOSOL) 200 (65 FE) MG TABS Take 2 tablets by mouth every morning.    . fluticasone (FLONASE) 50 MCG/ACT nasal spray Place 2 sprays into both nostrils daily. 1 g 2  . furosemide (LASIX) 40 MG tablet Take 40 mg by mouth daily. For fluid retention    . gabapentin (NEURONTIN) 300 MG capsule Take 300 mg by mouth 3 (three) times daily.     . insulin lispro (HUMALOG) 100 UNIT/ML injection Inject 5 Units into the skin 3 (three) times daily before meals.     Marland Kitchen levothyroxine (SYNTHROID, LEVOTHROID) 150 MCG tablet Take 150 mcg by mouth daily.      Marland Kitchen losartan (COZAAR) 50 MG tablet Take 1 tablet (50 mg total) by mouth daily.  Please schedule appointment for refills. 30 tablet 0  . metFORMIN (GLUCOPHAGE) 500 MG tablet TAKE ONE TABLET BY MOUTH TWICE DAILY WITH MEALS 60 tablet 3  . pantoprazole (PROTONIX) 40 MG tablet Take 40 mg by mouth 2 (two) times daily. Takes once daily    . potassium chloride (K-DUR,KLOR-CON) 10 MEQ tablet Take 1 tablet by mouth daily.    . pravastatin (PRAVACHOL) 80 MG tablet Take 80 mg by mouth at bedtime.    Nathen May SOLOSTAR 300 UNIT/ML SOPN Inject 50 Units as directed at bedtime.  2  . traMADol (ULTRAM) 50 MG tablet Take 50 mg by mouth 3 (three) times daily.      . Menthol-Methyl Salicylate (MUSCLE RUB) 10-15 % CREA Apply 1 application topically as needed (for pain in hands).    . Multiple Vitamin (MULTIVITAMIN WITH MINERALS) TABS tablet Take 1 tablet by mouth daily.    . nitroGLYCERIN (NITROSTAT) 0.4 MG SL tablet Place 1 tablet (0.4 mg total) under the tongue every 5 (five) minutes as needed for chest pain. 25 tablet 3    No results found for this or any previous visit (from the past 48 hour(s)). No results found.  ROS  Blood pressure (!) 132/52, pulse (!) 46, temperature 97.7 F (36.5 C), temperature source Oral, resp. rate 12, height 5\' 8"  (1.727 m), weight 264 lb (119.7 kg), SpO2 99 %. Physical Exam   Assessment/Plan 1. Large hyperplastic gastric polyp. Plan gastric polypectomy. Risk of bleeding discussed. Other risks discussed.The nature of the procedure, as well as the risks, benefits, and alternatives were carefully and thoroughly reviewed with the patient. Ample time for discussion and questions allowed. The patient understood, was satisfied, and agreed to proceed.  Yancey Flemings, MD 01/18/2016, 10:33 AM

## 2016-01-18 NOTE — Anesthesia Preprocedure Evaluation (Signed)
Anesthesia Evaluation  Patient identified by MRN, date of birth, ID band Patient awake    Reviewed: Allergy & Precautions, NPO status , Patient's Chart, lab work & pertinent test results  History of Anesthesia Complications Negative for: history of anesthetic complications  Airway Mallampati: III  TM Distance: >3 FB Neck ROM: Full    Dental  (+) Dental Advisory Given, Missing   Pulmonary sleep apnea and Continuous Positive Airway Pressure Ventilation ,    Pulmonary exam normal breath sounds clear to auscultation       Cardiovascular hypertension, Pt. on medications + CAD, + Cardiac Stents and + Peripheral Vascular Disease  Normal cardiovascular exam Rhythm:Regular Rate:Normal     Neuro/Psych PSYCHIATRIC DISORDERS Anxiety negative neurological ROS     GI/Hepatic Neg liver ROS, PUD, GERD  ,  Endo/Other  diabetes, Type 2, Insulin Dependent, Oral Hypoglycemic AgentsHypothyroidism Morbid obesity  Renal/GU Renal InsufficiencyRenal disease     Musculoskeletal negative musculoskeletal ROS (+)   Abdominal   Peds  Hematology  (+) Blood dyscrasia, anemia ,   Anesthesia Other Findings Day of surgery medications reviewed with the patient.  Reproductive/Obstetrics                             Anesthesia Physical Anesthesia Plan  ASA: III  Anesthesia Plan: MAC   Post-op Pain Management:    Induction: Intravenous  Airway Management Planned: Nasal Cannula  Additional Equipment:   Intra-op Plan:   Post-operative Plan:   Informed Consent: I have reviewed the patients History and Physical, chart, labs and discussed the procedure including the risks, benefits and alternatives for the proposed anesthesia with the patient or authorized representative who has indicated his/her understanding and acceptance.   Dental advisory given  Plan Discussed with: CRNA and Anesthesiologist  Anesthesia Plan  Comments: (Discussed risks/benefits/alternatives to MAC sedation including need for ventilatory support, hypotension, need for conversion to general anesthesia.  All patient questions answered.  Patient/guardian wishes to proceed.)        Anesthesia Quick Evaluation

## 2016-01-20 ENCOUNTER — Encounter: Payer: Self-pay | Admitting: Internal Medicine

## 2016-01-31 NOTE — Patient Instructions (Signed)
Your procedure is scheduled on:  02/07/2016               Report to [facility] at  6:15   AM.  Call this number if you have problems the morning of surgery: [phone]   Remember:   Do not eat or drink :After Midnight.    Take these medicines the morning of surgery with A SIP OF WATER:       Tramadol, Amlodipine, Lexapro, Flonase, Gabapentin, levothyroxine, and losartan                                                                                                                                      and use Albuterol inhaler     Do not wear jewelry, make-up or nail polish.  Do not wear lotions, powders, or perfumes. You may wear deodorant.  Do not bring valuables to the hospital.  Contacts, dentures or bridgework may not be worn into surgery.  Patients discharged the day of surgery will not be allowed to drive home.  Name and phone number of your driver:    @[email or web address] @ Cataract Surgery  A cataract is a clouding of the lens of the eye. When a lens becomes cloudy, vision is reduced based on the degree and nature of the clouding. Surgery may be needed to improve vision. Surgery removes the cloudy lens and usually replaces it with a substitute lens (intraocular lens, IOL). LET YOUR EYE DOCTOR KNOW ABOUT:  Allergies to food or medicine.   Medicines taken including herbs, eyedrops, over-the-counter medicines, and creams.   Use of steroids (by mouth or creams).   Previous problems with anesthetics or numbing medicine.   History of bleeding problems or blood clots.   Previous surgery.   Other health problems, including diabetes and kidney problems.   Possibility of pregnancy, if this applies.  RISKS AND COMPLICATIONS  Infection.   Inflammation of the eyeball (endophthalmitis) that can spread to both eyes (sympathetic ophthalmia).   Poor wound healing.   If an IOL is inserted, it can later fall out of proper position. This is very uncommon.   Clouding of the part of  your eye that holds an IOL in place. This is called an "after-cataract." These are uncommon, but easily treated.  BEFORE THE PROCEDURE  Do not eat or drink anything except small amounts of water for 8 to 12 before your surgery, or as directed by your caregiver.   Unless you are told otherwise, continue any eyedrops you have been prescribed.   Talk to your primary caregiver about all other medicines that you take (both prescription and non-prescription). In some cases, you may need to stop or change medicines near the time of your surgery. This is most important if you are taking blood-thinning medicine.Do not stop medicines unless you are told to do so.   Arrange for someone to drive you to and from the  procedure.   Do not put contact lenses in either eye on the day of your surgery.  PROCEDURE There is more than one method for safely removing a cataract. Your doctor can explain the differences and help determine which is best for you. Phacoemulsification surgery is the most common form of cataract surgery.  An injection is given behind the eye or eyedrops are given to make this a painless procedure.   A small cut (incision) is made on the edge of the clear, dome-shaped surface that covers the front of the eye (cornea).   A tiny probe is painlessly inserted into the eye. This device gives off ultrasound waves that soften and break up the cloudy center of the lens. This makes it easier for the cloudy lens to be removed by suction.   An IOL may be implanted.   The normal lens of the eye is covered by a clear capsule. Part of that capsule is intentionally left in the eye to support the IOL.   Your surgeon may or may not use stitches to close the incision.  There are other forms of cataract surgery that require a larger incision and stiches to close the eye. This approach is taken in cases where the doctor feels that the cataract cannot be easily removed using phacoemulsification. AFTER THE  PROCEDURE  When an IOL is implanted, it does not need care. It becomes a permanent part of your eye and cannot be seen or felt.   Your doctor will schedule follow-up exams to check on your progress.   Review your other medicines with your doctor to see which can be resumed after surgery.   Use eyedrops or take medicine as prescribed by your doctor.  Document Released: 04/06/2011 Document Reviewed: 04/03/2011 [facility] Patient Information 2012 [place], [place].  .Cataract Surgery Care After Refer to this sheet in the next few weeks. These instructions provide you with information on caring for yourself after your procedure. Your caregiver may also give you more specific instructions. Your treatment has been planned according to current medical practices, but problems sometimes occur. Call your caregiver if you have any problems or questions after your procedure.  HOME CARE INSTRUCTIONS   Avoid strenuous activities as directed by your caregiver.   Ask your caregiver when you can resume driving.   Use eyedrops or other medicines to help healing and control pressure inside your eye as directed by your caregiver.   Only take over-the-counter or prescription medicines for pain, discomfort, or fever as directed by your caregiver.   Do not to touch or rub your eyes.   You may be instructed to use a protective shield during the first few days and nights after surgery. If not, wear sunglasses to protect your eyes. This is to protect the eye from pressure or from being accidentally bumped.   Keep the area around your eye clean and dry. Avoid swimming or allowing water to hit you directly in the face while showering. Keep soap and shampoo out of your eyes.   Do not bend or lift heavy objects. Bending increases pressure in the eye. You can walk, climb stairs, and do light household chores.   Do not put a contact lens into the eye that had surgery until your caregiver says it is okay to do so.    Ask your doctor when you can return to work. This will depend on the kind of work that you do. If you work in a dusty environment, you may be  advised to wear protective eyewear for a period of time.   Ask your caregiver when it will be safe to engage in sexual activity.   Continue with your regular eye exams as directed by your caregiver.  What to expect:  It is normal to feel itching and mild discomfort for a few days after cataract surgery. Some fluid discharge is also common, and your eye may be sensitive to light and touch.   After 1 to 2 days, even moderate discomfort should disappear. In most cases, healing will take about 6 weeks.   If you received an intraocular lens (IOL), you may notice that colors are very bright or have a blue tinge. Also, if you have been in bright sunlight, everything may appear reddish for a few hours. If you see these color tinges, it is because your lens is clear and no longer cloudy. Within a few months after receiving an IOL, these extra colors should go away. When you have healed, you will probably need new glasses.  SEEK MEDICAL CARE IF:   You have increased bruising around your eye.   You have discomfort not helped by medicine.  SEEK IMMEDIATE MEDICAL CARE IF:   You have a fever.   You have a worsening or sudden vision loss.   You have redness, swelling, or increasing pain in the eye.   You have a thick discharge from the eye that had surgery.  MAKE SURE YOU:  Understand these instructions.   Will watch your condition.   Will get help right away if you are not doing well or get worse.  Document Released: 11/04/2004 Document Revised: 04/06/2011 Document Reviewed: 12/09/2010 [facility] Patient Information 2012 [place], [place].    Monitored Anesthesia Care  Monitored anesthesia care is an anesthesia service for a medical procedure. Anesthesia is the loss of the ability to feel pain. It is produced by medications called anesthetics. It may  affect a small area of your body (local anesthesia), a large area of your body (regional anesthesia), or your entire body (general anesthesia). The need for monitored anesthesia care depends your procedure, your condition, and the potential need for regional or general anesthesia. It is often provided during procedures where:   General anesthesia may be needed if there are complications. This is because you need special care when you are under general anesthesia.   You will be under local or regional anesthesia. This is so that you are able to have higher levels of anesthesia if needed.   You will receive calming medications (sedatives). This is especially the case if sedatives are given to put you in a semi-conscious state of relaxation (deep sedation). This is because the amount of sedative needed to produce this state can be hard to predict. Too much of a sedative can produce general anesthesia. Monitored anesthesia care is performed by one or more caregivers who have special training in all types of anesthesia. You will need to meet with these caregivers before your procedure. During this meeting, they will ask you about your medical history. They will also give you instructions to follow. (For example, you will need to stop eating and drinking before your procedure. You may also need to stop or change medications you are taking.) During your procedure, your caregivers will stay with you. They will:   Watch your condition. This includes watching you blood pressure, breathing, and level of pain.   Diagnose and treat problems that occur.   Give medications if they are needed. These  may include calming medications (sedatives) and anesthetics.   Make sure you are comfortable.  Having monitored anesthesia care does not necessarily mean that you will be under anesthesia. It does mean that your caregivers will be able to manage anesthesia if you need it or if it occurs. It also means that you will  be able to have a different type of anesthesia than you are having if you need it. When your procedure is complete, your caregivers will continue to watch your condition. They will make sure any medications wear off before you are allowed to go home.  Document Released: 01/11/2005 Document Revised: 08/12/2012 Document Reviewed: 05/29/2012 [facility] Patient Information 2014 [place], [place].

## 2016-02-02 ENCOUNTER — Encounter: Payer: Self-pay | Admitting: "Endocrinology

## 2016-02-02 ENCOUNTER — Encounter (HOSPITAL_COMMUNITY): Payer: Self-pay

## 2016-02-02 ENCOUNTER — Ambulatory Visit (INDEPENDENT_AMBULATORY_CARE_PROVIDER_SITE_OTHER): Payer: Medicare Other | Admitting: "Endocrinology

## 2016-02-02 ENCOUNTER — Encounter (HOSPITAL_COMMUNITY)
Admission: RE | Admit: 2016-02-02 | Discharge: 2016-02-02 | Disposition: A | Discharge status: Home / Self Care | Payer: Medicare Other | Source: Ambulatory Visit | Attending: Ophthalmology | Admitting: Ophthalmology

## 2016-02-02 ENCOUNTER — Other Ambulatory Visit: Payer: Self-pay

## 2016-02-02 VITALS — BP 133/81 | HR 52 | Ht 68.0 in | Wt 270.0 lb

## 2016-02-02 DIAGNOSIS — I10 Essential (primary) hypertension: Secondary | ICD-10-CM | POA: Diagnosis not present

## 2016-02-02 DIAGNOSIS — E1159 Type 2 diabetes mellitus with other circulatory complications: Secondary | ICD-10-CM | POA: Diagnosis not present

## 2016-02-02 DIAGNOSIS — Z01818 Encounter for other preprocedural examination: Secondary | ICD-10-CM | POA: Diagnosis not present

## 2016-02-02 DIAGNOSIS — E038 Other specified hypothyroidism: Secondary | ICD-10-CM | POA: Diagnosis not present

## 2016-02-02 DIAGNOSIS — Z9119 Patient's noncompliance with other medical treatment and regimen: Secondary | ICD-10-CM

## 2016-02-02 DIAGNOSIS — E782 Mixed hyperlipidemia: Secondary | ICD-10-CM

## 2016-02-02 DIAGNOSIS — Z91199 Patient's noncompliance with other medical treatment and regimen due to unspecified reason: Secondary | ICD-10-CM | POA: Insufficient documentation

## 2016-02-02 LAB — BASIC METABOLIC PANEL
Anion gap: 8 (ref 5–15)
BUN: 13 mg/dL (ref 6–20)
CHLORIDE: 103 mmol/L (ref 101–111)
CO2: 24 mmol/L (ref 22–32)
Calcium: 9 mg/dL (ref 8.9–10.3)
Creatinine, Ser: 1.53 mg/dL — ABNORMAL HIGH (ref 0.44–1.00)
GFR calc Af Amer: 39 mL/min — ABNORMAL LOW (ref >60)
GFR calc non Af Amer: 34 mL/min — ABNORMAL LOW (ref >60)
Glucose, Bld: 159 mg/dL — ABNORMAL HIGH (ref 65–99)
POTASSIUM: 4 mmol/L (ref 3.5–5.1)
SODIUM: 135 mmol/L (ref 135–145)

## 2016-02-02 LAB — CBC
HCT: 33.9 % — ABNORMAL LOW (ref 36.0–46.0)
Hemoglobin: 10.9 g/dL — ABNORMAL LOW (ref 12.0–15.0)
MCH: 31.8 pg (ref 26.0–34.0)
MCHC: 32.2 g/dL (ref 30.0–36.0)
MCV: 98.8 fL (ref 78.0–100.0)
Platelets: 226 10*3/uL (ref 150–400)
RBC: 3.43 MIL/uL — ABNORMAL LOW (ref 3.87–5.11)
RDW: 14.1 % (ref 11.5–15.5)
WBC: 7.5 10*3/uL (ref 4.0–10.5)

## 2016-02-02 NOTE — Progress Notes (Signed)
Subjective:    Patient ID: Jenna Diaz, female    DOB: May 20, 1945. Patient is being seen in consultation for management of diabetes requested by  [facility], [place]  Past Medical History:  Diagnosis Date  . Anemia   . Anxiety   . Bowel obstruction 1997  . Bursitis   . CAD (coronary artery disease)    A. 11/2010 - Cath: 60% LAD, 50% RCA ;  B. s/p Promus DES to LAD (by FFR 12/12);  RCA ok by FFR 12/12  . Carpal tunnel syndrome   . Cholelithiasis   . Chronic kidney disease    questionable  . Diabetes mellitus   . Esophageal stricture   . Essential hypertension   . Ganglion cyst   . Gastric ulcer   . Gastroparesis   . GERD (gastroesophageal reflux disease)    PUD with UGI bleed in 8/12 dx by EGD  . Hyperlipemia   . Hypertension   . Hypothyroidism   . Osteoarthritis   . Peripheral vascular disease (HCC)   . Shortness of breath    with exertion  . Sleep apnea    CPAP, pt does not knoe settings   Past Surgical History:  Procedure Laterality Date  . ABDOMINAL HYSTERECTOMY  1970's  . COLON RESECTION  1990's  . COLON SURGERY     COLONECTOMY WEARS A BAG  . DILATION AND CURETTAGE OF UTERUS     "before hysterectomy"  . ESOPHAGOGASTRODUODENOSCOPY N/A 01/18/2016   Procedure: ESOPHAGOGASTRODUODENOSCOPY (EGD);  Surgeon: Hilarie Fredrickson, MD;  Location: [facility];  Service: Endoscopy;  Laterality: N/A;  . GASTROSTOMY W/ FEEDING TUBE  1990's   later removed  . HAND SURGERY     cyst on left hand  . LEFT AND RIGHT HEART CATHETERIZATION WITH CORONARY ANGIOGRAM N/A 04/28/2011   Procedure: LEFT AND RIGHT HEART CATHETERIZATION WITH CORONARY ANGIOGRAM;  Surgeon: Tonny Bollman, MD;  Location: [facility] CATH LAB;  Service: Cardiovascular;  Laterality: N/A;  . NISSEN FUNDOPLICATION     questionable - pt unsure of surgical name  . UPPER GASTROINTESTINAL ENDOSCOPY     Social History   Social History  . Marital status: Single    Spouse name: N/A  . Number of children: 3  . Years of  education: N/A   Occupational History  . disability    Social History Main Topics  . Smoking status: Never Smoker  . Smokeless tobacco: Never Used  . Alcohol use No  . Drug use: No  . Sexual activity: No   Other Topics Concern  . None   Social History Narrative  . None   Outpatient Encounter Prescriptions as of 02/02/2016  Medication Sig  . acetaminophen (TYLENOL) 500 MG tablet Take 1,000 mg by mouth every 6 (six) hours as needed for mild pain.   Marland Kitchen albuterol (PROAIR HFA) 108 (90 BASE) MCG/ACT inhaler Inhale 2 puffs into the lungs every 6 (six) hours as needed for wheezing.  Marland Kitchen amLODipine (NORVASC) 5 MG tablet Take 1 tablet (5 mg total) by mouth daily. Please schedule appointment for refills.  Marland Kitchen aspirin EC 81 MG tablet Take 81 mg by mouth daily.  Marland Kitchen escitalopram (LEXAPRO) 20 MG tablet Take 20 mg by mouth daily.   . Ferrous Sulfate Dried (FEOSOL) 200 (65 FE) MG TABS Take 2 tablets by mouth every morning.  . fluticasone (FLONASE) 50 MCG/ACT nasal spray Place 2 sprays into both nostrils daily.  . furosemide (LASIX) 40 MG tablet Take 40 mg by mouth daily. For  fluid retention  . gabapentin (NEURONTIN) 300 MG capsule Take 300 mg by mouth 3 (three) times daily.   . insulin lispro (HUMALOG) 100 UNIT/ML injection Inject 12-14 Units into the skin 3 (three) times daily before meals.   Marland Kitchen. levothyroxine (SYNTHROID, LEVOTHROID) 150 MCG tablet Take 150 mcg by mouth daily.    Marland Kitchen. loratadine (CLARITIN) 10 MG tablet Take 10 mg by mouth daily.  Marland Kitchen. losartan (COZAAR) 50 MG tablet Take 1 tablet (50 mg total) by mouth daily. Please schedule appointment for refills.  . Menthol-Methyl Salicylate (MUSCLE RUB) 10-15 % CREA Apply 1 application topically as needed (for pain in hands).  . metFORMIN (GLUCOPHAGE) 500 MG tablet TAKE ONE TABLET BY MOUTH TWICE DAILY WITH MEALS  . Multiple Vitamin (MULTIVITAMIN WITH MINERALS) TABS tablet Take 1 tablet by mouth daily.  . nitroGLYCERIN (NITROSTAT) 0.4 MG SL tablet Place 1  tablet (0.4 mg total) under the tongue every 5 (five) minutes as needed for chest pain.  . pantoprazole (PROTONIX) 40 MG tablet Take 40 mg by mouth 2 (two) times daily.   . potassium chloride SA (K-DUR,KLOR-CON) 20 MEQ tablet Take 1 tablet by mouth daily.  . pravastatin (PRAVACHOL) 80 MG tablet Take 80 mg by mouth at bedtime.  Nathen May. TOUJEO SOLOSTAR 300 UNIT/ML SOPN Inject 40 Units as directed at bedtime.   . traMADol (ULTRAM) 50 MG tablet Take 50 mg by mouth daily.   . [DISCONTINUED] potassium chloride (K-DUR,KLOR-CON) 10 MEQ tablet Take 1 tablet by mouth daily.   Facility-Administered Encounter Medications as of 02/02/2016  Medication  . 0.9 %  sodium chloride infusion   ALLERGIES: No Known Allergies VACCINATION STATUS: Immunization History  Administered Date(s) Administered  . Influenza Split 04/11/2011, 02/03/2013  . Influenza Whole 01/23/2012, 02/08/2014  . Pneumococcal Polysaccharide-23 11/30/2010    Diabetes  She presents for her follow-up diabetic visit. She has type 2 diabetes mellitus. Onset time: She was diagnosed at approximate age of 42 years. Her disease course has been worsening. There are no hypoglycemic associated symptoms. Pertinent negatives for hypoglycemia include no confusion, headaches, pallor or seizures. Associated symptoms include fatigue. Pertinent negatives for diabetes include no chest pain, no polydipsia, no polyphagia and no polyuria. There are no hypoglycemic complications. Symptoms are worsening. Diabetic complications include heart disease. Risk factors for coronary artery disease include diabetes mellitus, dyslipidemia, family history, hypertension, female sex, obesity and sedentary lifestyle. Current diabetic treatment includes insulin injections and oral agent (monotherapy). She is compliant with treatment most of the time. Her weight is stable (She has stabilized after she lost 14 pounds overall. ). She is following a generally unhealthy diet. When asked about  meal planning, she reported none. She has not had a previous visit with a dietitian. She never participates in exercise. Home blood sugar record trend: Tragically, she came with no meter nor logs. She reports that her A1c remains high at 9.1% at her primary care doctor's office. An ACE inhibitor/angiotensin II receptor blocker is being taken.  Hyperlipidemia  This is a chronic problem. The current episode started more than 1 year ago. Pertinent negatives include no chest pain, myalgias or shortness of breath. Current antihyperlipidemic treatment includes statins. Risk factors for coronary artery disease include diabetes mellitus, dyslipidemia, hypertension, female sex and a sedentary lifestyle.  Hypertension  This is a chronic problem. The current episode started more than 1 year ago. Pertinent negatives include no chest pain, headaches, palpitations or shortness of breath. Risk factors for coronary artery disease include dyslipidemia, diabetes mellitus, obesity  and sedentary lifestyle. Past treatments include angiotensin blockers. Hypertensive end-organ damage includes CAD/MI.      Review of Systems  Constitutional: Positive for fatigue. Negative for chills, fever and unexpected weight change.  HENT: Negative for trouble swallowing and voice change.   Eyes: Negative for visual disturbance.  Respiratory: Negative for cough, shortness of breath and wheezing.   Cardiovascular: Negative for chest pain, palpitations and leg swelling.  Gastrointestinal: Negative for diarrhea, nausea and vomiting.  Endocrine: Negative for cold intolerance, heat intolerance, polydipsia, polyphagia and polyuria.  Musculoskeletal: Negative for arthralgias and myalgias.  Skin: Negative for color change, pallor, rash and wound.  Neurological: Negative for seizures and headaches.  Psychiatric/Behavioral: Negative for confusion and suicidal ideas.    Objective:    BP 133/81   Pulse (!) 52   Ht 5\' 8"  (1.727 m)   Wt 270  lb (122.5 kg)   BMI 41.05 kg/m   Wt Readings from Last 3 Encounters:  02/02/16 270 lb (122.5 kg)  02/02/16 270 lb 2 oz (122.5 kg)  01/18/16 264 lb (119.7 kg)    Physical Exam  Constitutional: She is oriented to person, place, and time. She appears well-developed.  HENT:  Head: Normocephalic and atraumatic.  Eyes: EOM are normal.  Neck: Normal range of motion. Neck supple. No tracheal deviation present. No thyromegaly present.  Cardiovascular: Normal rate and regular rhythm.   Pulmonary/Chest: Effort normal and breath sounds normal.  Abdominal: Soft. Bowel sounds are normal. There is no tenderness. There is no guarding.  Colostomy bag in place.  Musculoskeletal: Normal range of motion. She exhibits no edema.  Neurological: She is alert and oriented to person, place, and time. She has normal reflexes. No cranial nerve deficit. Coordination normal.  Skin: Skin is warm and dry. No rash noted. No erythema. No pallor.  Psychiatric: She has a normal mood and affect. Judgment normal.    CMP     Component Value Date/Time   NA 135 02/02/2016 0824   NA 138 10/01/2015 0936   K 4.0 02/02/2016 0824   CL 103 02/02/2016 0824   CO2 24 02/02/2016 0824   GLUCOSE 159 (H) 02/02/2016 0824   BUN 13 02/02/2016 0824   BUN 9 10/01/2015 0936   CREATININE 1.53 (H) 02/02/2016 0824   CALCIUM 9.0 02/02/2016 0824   PROT 6.6 07/25/2013 2031   ALBUMIN 3.1 (L) 07/25/2013 2031   AST 24 07/25/2013 2031   ALT 24 07/25/2013 2031   ALKPHOS 73 07/25/2013 2031   BILITOT 0.4 07/25/2013 2031   GFRNONAA 34 (L) 02/02/2016 0824   GFRAA 39 (L) 02/02/2016 0824   Her most recent A1c  Has increased to 9.2% from  8.2%   Assessment & Plan:   1. DM type 2 causing vascular disease (HCC)  - Patient has currently uncontrolled symptomatic type 2 DM since  70 years of age,  with most recent A1c of  9.2% worsening from 8.2 %. Recent labs reviewed. -She came with significantly above target blood glucose profile  largely  due to due to failure to cover her meals properly with Humalog.    Her diabetes is complicated by coronary artery disease status post stent placement and mild renal insufficiency and patient remains at a high risk for more acute and chronic complications of diabetes which include CAD, CVA, CKD, retinopathy, and neuropathy. These are all discussed in detail with the patient.  - I have counseled the patient on diet management and weight loss, by adopting a carbohydrate restricted/protein  rich diet.  - Suggestion is made for patient to avoid simple carbohydrates   from their diet including Cakes , Desserts, Ice Cream,  Soda (  diet and regular) , Sweet Tea , Candies,  Chips, Cookies, Artificial Sweeteners,   and "Sugar-free" Products . This will help patient to have stable blood glucose profile and potentially avoid unintended weight gain.  - I encouraged the patient to switch to  unprocessed or minimally processed complex starch and increased protein intake (animal or plant source), fruits, and vegetables.  - Patient is advised to stick to a routine mealtimes to eat 3 meals  a day and avoid unnecessary snacks ( to snack only to correct hypoglycemia).  - The patient will be scheduled with Norm Salt, RDN, CDE for individualized DM education.  - I have approached patient with the following individualized plan to manage diabetes and patient agrees:   - She came with no meter nor logs. She reports that her A1c is unchanged from 9.1% at her doctor's office. - I urged her to engage better I  will resume  Toujeo  50 units QHS, and  Humalog  10 units TIDAC for pre-meal BG readings of 90-150mg /dl, plus patient specific correction dose for unexpected hyperglycemia above 150mg /dl, associated with strict monitoring of glucose  AC and HS. - Patient is warned not to take insulin without proper monitoring per orders. -Adjustment parameters are given for hypo and hyperglycemia in writing. -Patient is encouraged  to call clinic for blood glucose levels less than 70 or above 300 mg /dl. - I will discontinue metformin  due to advancing chronic kidney failure.  -Patient is not a candidate for SGLT2 inhibitors due to CKD.  - Patient will be considered for incretin therapy as appropriate next visit. - Patient specific target  A1c;  LDL, HDL, Triglycerides, and  Waist Circumference were discussed in detail.  2) BP/HTN: Controlled. Continue current medications including ACEI/ARB. 3) Lipids/HPL:  Controlled, continue statins. 4)  Weight/Diet: CDE Consult will be initiated , exercise, and detailed carbohydrates information provided.  5) Hypothyroidism - She has no recent thyroid function test. I advised her to continue levothyroxine 150 g by mouth every morning for now.  - We discussed about correct intake of levothyroxine, at fasting, with water, separated by at least 30 minutes from breakfast, and separated by more than 4 hours from calcium, iron, multivitamins, acid reflux medications (PPIs). -Patient is made aware of the fact that thyroid hormone replacement is needed for life, dose to be adjusted by periodic monitoring of thyroid function tests.  6) Chronic Care/Health Maintenance:  -Patient is on ACEI/ARB and Statin medications and encouraged to continue to follow up with Ophthalmology, Podiatrist at least yearly or according to recommendations, and advised to   stay away from smoking. I have recommended yearly flu vaccine and pneumonia vaccination at least every 5 years; moderate intensity exercise for up to 150 minutes weekly; and  sleep for at least 7 hours a day.  - 25 minutes of time was spent on the care of this patient , 50% of which was applied for counseling on diabetes complications and their preventions.  - Patient to bring meter and  blood glucose logs during their next visit.   - I advised patient to maintain close follow up with [facility], MD for primary care needs.  Follow up  plan: - Return in about 1 week (around 02/09/2016) for follow up with meter and logs- no labs.  Marquis Lunch, MD Phone: [phone]  Fax: [phone]   02/02/2016, 2:30 PM

## 2016-02-04 MED ORDER — CYCLOPENTOLATE-PHENYLEPHRINE 0.2-1 % OP SOLN
OPHTHALMIC | Status: AC
  Filled 2016-02-04: qty 2

## 2016-02-04 MED ORDER — LIDOCAINE HCL (PF) 1 % IJ SOLN
INTRAMUSCULAR | Status: AC
  Filled 2016-02-04: qty 2

## 2016-02-04 MED ORDER — LIDOCAINE HCL 3.5 % OP GEL
OPHTHALMIC | Status: AC
  Filled 2016-02-04: qty 1

## 2016-02-04 MED ORDER — TETRACAINE HCL 0.5 % OP SOLN
OPHTHALMIC | Status: AC
  Filled 2016-02-04: qty 4

## 2016-02-04 MED ORDER — PHENYLEPHRINE HCL 2.5 % OP SOLN
OPHTHALMIC | Status: AC
  Filled 2016-02-04: qty 15

## 2016-02-04 MED ORDER — NEOMYCIN-POLYMYXIN-DEXAMETH 3.5-10000-0.1 OP SUSP
OPHTHALMIC | Status: AC
  Filled 2016-02-04: qty 5

## 2016-02-07 ENCOUNTER — Ambulatory Visit (HOSPITAL_COMMUNITY)
Admission: RE | Admit: 2016-02-07 | Discharge: 2016-02-07 | Disposition: A | Discharge status: Home / Self Care | Payer: Medicare Other | Source: Ambulatory Visit | Attending: Ophthalmology | Admitting: Ophthalmology

## 2016-02-07 ENCOUNTER — Encounter (HOSPITAL_COMMUNITY): Payer: Self-pay | Admitting: *Deleted

## 2016-02-07 ENCOUNTER — Ambulatory Visit (HOSPITAL_COMMUNITY): Payer: Medicare Other | Admitting: Anesthesiology

## 2016-02-07 ENCOUNTER — Encounter (HOSPITAL_COMMUNITY)
Admission: RE | Disposition: A | Discharge status: Home / Self Care | Payer: Self-pay | Source: Ambulatory Visit | Attending: Ophthalmology

## 2016-02-07 DIAGNOSIS — H40051 Ocular hypertension, right eye: Secondary | ICD-10-CM | POA: Diagnosis not present

## 2016-02-07 DIAGNOSIS — K219 Gastro-esophageal reflux disease without esophagitis: Secondary | ICD-10-CM | POA: Diagnosis not present

## 2016-02-07 DIAGNOSIS — H35363 Drusen (degenerative) of macula, bilateral: Secondary | ICD-10-CM | POA: Diagnosis not present

## 2016-02-07 DIAGNOSIS — F419 Anxiety disorder, unspecified: Secondary | ICD-10-CM | POA: Insufficient documentation

## 2016-02-07 DIAGNOSIS — E1136 Type 2 diabetes mellitus with diabetic cataract: Secondary | ICD-10-CM | POA: Diagnosis present

## 2016-02-07 DIAGNOSIS — D649 Anemia, unspecified: Secondary | ICD-10-CM | POA: Insufficient documentation

## 2016-02-07 DIAGNOSIS — I10 Essential (primary) hypertension: Secondary | ICD-10-CM | POA: Insufficient documentation

## 2016-02-07 DIAGNOSIS — I2510 Atherosclerotic heart disease of native coronary artery without angina pectoris: Secondary | ICD-10-CM | POA: Diagnosis not present

## 2016-02-07 DIAGNOSIS — Z794 Long term (current) use of insulin: Secondary | ICD-10-CM | POA: Insufficient documentation

## 2016-02-07 DIAGNOSIS — Z79899 Other long term (current) drug therapy: Secondary | ICD-10-CM | POA: Insufficient documentation

## 2016-02-07 DIAGNOSIS — E1151 Type 2 diabetes mellitus with diabetic peripheral angiopathy without gangrene: Secondary | ICD-10-CM | POA: Diagnosis not present

## 2016-02-07 DIAGNOSIS — E11319 Type 2 diabetes mellitus with unspecified diabetic retinopathy without macular edema: Secondary | ICD-10-CM | POA: Diagnosis not present

## 2016-02-07 DIAGNOSIS — E039 Hypothyroidism, unspecified: Secondary | ICD-10-CM | POA: Diagnosis not present

## 2016-02-07 DIAGNOSIS — G4730 Sleep apnea, unspecified: Secondary | ICD-10-CM | POA: Insufficient documentation

## 2016-02-07 HISTORY — PX: CATARACT EXTRACTION W/PHACO: SHX586

## 2016-02-07 LAB — GLUCOSE, CAPILLARY: Glucose-Capillary: 259 mg/dL — ABNORMAL HIGH (ref 65–99)

## 2016-02-07 SURGERY — PHACOEMULSIFICATION, CATARACT, WITH IOL INSERTION
Anesthesia: Monitor Anesthesia Care | Site: Eye | Laterality: Left

## 2016-02-07 MED ORDER — CYCLOPENTOLATE-PHENYLEPHRINE 0.2-1 % OP SOLN
1.0000 [drp] | OPHTHALMIC | Status: AC
  Administered 2016-02-07 (×3): 1 [drp] via OPHTHALMIC

## 2016-02-07 MED ORDER — LIDOCAINE HCL 3.5 % OP GEL
1.0000 "application " | Freq: Once | OPHTHALMIC | Status: AC
  Administered 2016-02-07: 1 via OPHTHALMIC

## 2016-02-07 MED ORDER — NEOMYCIN-POLYMYXIN-DEXAMETH 3.5-10000-0.1 OP SUSP
OPHTHALMIC | Status: DC | PRN
  Administered 2016-02-07: 2 [drp] via OPHTHALMIC

## 2016-02-07 MED ORDER — SODIUM HYALURONATE 23 MG/ML IO SOLN
INTRAOCULAR | Status: DC | PRN
  Administered 2016-02-07: 0.6 mL via INTRAOCULAR

## 2016-02-07 MED ORDER — EPINEPHRINE HCL 1 MG/ML IJ SOLN
INTRAMUSCULAR | Status: AC
  Filled 2016-02-07: qty 1

## 2016-02-07 MED ORDER — MIDAZOLAM HCL 2 MG/2ML IJ SOLN
INTRAMUSCULAR | Status: AC
  Filled 2016-02-07: qty 2

## 2016-02-07 MED ORDER — POVIDONE-IODINE 5 % OP SOLN
OPHTHALMIC | Status: DC | PRN
  Administered 2016-02-07: 1 via OPHTHALMIC

## 2016-02-07 MED ORDER — EPINEPHRINE HCL 1 MG/ML IJ SOLN
INTRAMUSCULAR | Status: DC | PRN
  Administered 2016-02-07: .9 mL via OPHTHALMIC

## 2016-02-07 MED ORDER — PROVISC 10 MG/ML IO SOLN
INTRAOCULAR | Status: DC | PRN
  Administered 2016-02-07: .85 mL via INTRAOCULAR

## 2016-02-07 MED ORDER — TETRACAINE HCL 0.5 % OP SOLN
1.0000 [drp] | OPHTHALMIC | Status: AC
  Administered 2016-02-07 (×3): 1 [drp] via OPHTHALMIC

## 2016-02-07 MED ORDER — EPINEPHRINE HCL 1 MG/ML IJ SOLN
INTRAOCULAR | Status: DC | PRN
  Administered 2016-02-07: 500 mL

## 2016-02-07 MED ORDER — LIDOCAINE HCL (PF) 1 % IJ SOLN
INTRAMUSCULAR | Status: AC
  Filled 2016-02-07: qty 2

## 2016-02-07 MED ORDER — FENTANYL CITRATE (PF) 100 MCG/2ML IJ SOLN
25.0000 ug | INTRAMUSCULAR | Status: AC | PRN
  Administered 2016-02-07 (×2): 25 ug via INTRAVENOUS

## 2016-02-07 MED ORDER — BSS IO SOLN
INTRAOCULAR | Status: DC | PRN
  Administered 2016-02-07: 15 mL

## 2016-02-07 MED ORDER — PHENYLEPHRINE HCL 2.5 % OP SOLN
1.0000 [drp] | OPHTHALMIC | Status: AC
  Administered 2016-02-07 (×3): 1 [drp] via OPHTHALMIC

## 2016-02-07 MED ORDER — LACTATED RINGERS IV SOLN
INTRAVENOUS | Status: DC
  Administered 2016-02-07: 07:00:00 via INTRAVENOUS

## 2016-02-07 MED ORDER — MIDAZOLAM HCL 2 MG/2ML IJ SOLN
1.0000 mg | INTRAMUSCULAR | Status: DC | PRN
  Administered 2016-02-07: 2 mg via INTRAVENOUS

## 2016-02-07 MED ORDER — FENTANYL CITRATE (PF) 100 MCG/2ML IJ SOLN
INTRAMUSCULAR | Status: AC
  Filled 2016-02-07: qty 2

## 2016-02-07 SURGICAL SUPPLY — 12 items
CLOTH BEACON ORANGE TIMEOUT ST (SAFETY) ×3 IMPLANT
EYE SHIELD UNIVERSAL CLEAR (GAUZE/BANDAGES/DRESSINGS) ×2 IMPLANT
GLOVE BIOGEL PI IND STRL 7.0 (GLOVE) ×1 IMPLANT
GLOVE BIOGEL PI INDICATOR 7.0 (GLOVE) ×2
GLOVE EXAM NITRILE MD LF STRL (GLOVE) ×3 IMPLANT
PAD ARMBOARD 7.5X6 YLW CONV (MISCELLANEOUS) ×2 IMPLANT
SIGHTPATH CAT PROC W REG LENS (Ophthalmic Related) ×3 IMPLANT
SYRINGE 3CC LL L/F (MISCELLANEOUS) ×2 IMPLANT
TAPE SURG TRANSPORE 1 IN (GAUZE/BANDAGES/DRESSINGS) IMPLANT
TAPE SURGICAL TRANSPORE 1 IN (GAUZE/BANDAGES/DRESSINGS) ×2
VISCOELASTIC ADDITIONAL (OPHTHALMIC RELATED) ×6 IMPLANT
WATER STERILE IRR 250ML POUR (IV SOLUTION) ×2 IMPLANT

## 2016-02-07 NOTE — Op Note (Signed)
Date of procedure:   Pre-operative diagnosis: Visually significant cataract, Left Eye  Post-operative diagnosis: Visually significant cataract, Left Eye  Procedure: Removal of cataract via phacoemulsification and insertion of intra-ocular lens Hoya 250 +20.5D into the capsular bag of the Left Eye  Attending surgeon: Tyce Delcid A. Karyssa Amaral, MD, MA  AnRudy Jewesthesia: MAC, Topical Akten  Complications: None  Estimated Blood Loss: <571mL (minimal)  Specimens: None  Implants: As above  Indications:  Visually significant cataract, Left Eye  Procedure:  The patient was seen and identified in the pre-operative area. The operative eye was identified and dilated.  The operative eye was marked.  Topical anesthesia was administered to the operative eye.     The patient was then to the operative suite and placed in the supine position.  A timeout was performed confirming the patient, procedure to be performed, and all other relevant information.   The patient's face was prepped and draped in the usual fashion for intra-ocular surgery.  A lid speculum was placed into the operative eye and the surgical microscope moved into place and focused.  A superotemporal paracentesis was created using a 15 degree blade.  Shugarcaine was injected into the anterior chamber.  Viscoelastic was injected into the anterior chamber.  A temporal clear-corneal main wound incision was created using a 2.864mm microkeratome.  A continuous curvilinear capsulorrhexis was initiated using an irrigating cystitome and completed using capsulorrhexis forceps.  Hydrodissection and hydrodeliniation were performed.  Viscoelastic was injected into the anterior chamber.  A phacoemulsification handpiece and a chopper as a second instrument were used to remove the nucleus and epinucleus. The irrigation/aspiration handpiece was used to remove any remaining cortical material.   The capsular bag was reinflated with viscoelastic, checked, and found to be intact.  The new intraocular lens was inserted into the capsular bag and dialed into place using a Sinskey hook.  The irrigation/aspiration handpiece was used to remove any remaining viscoelastic.  The clear corneal wound and paracentesis wounds were then hydrated and checked with Weck-Cels to be watertight.  The lid-speculum and drape was removed, and the patient's face was cleaned with a wet and dry 4x4.  Maxitrol was instilled in the eye before a clear shield was taped over the eye. The patient was taken to the post-operative care unit in good condition, having tolerated the procedure well.  Post-Op Instructions: The patient will [facility] for a same day post-operative evaluation and will receive all other orders and instructions.

## 2016-02-07 NOTE — Anesthesia Procedure Notes (Signed)
Procedure Name: MAC Date/Time: 02/07/2016 7:23 AM Performed by: Franco NonesYATES, Anacristina Steffek S Pre-anesthesia Checklist: Patient identified, Emergency Drugs available, Suction available, Timeout performed and Patient being monitored Patient Re-evaluated:Patient Re-evaluated prior to inductionOxygen Delivery Method: Nasal Cannula

## 2016-02-07 NOTE — H&P (Signed)
The H and P was reviewed and updated. The patient was examined.  No changes were found after exam.  The surgical eye was marked.  

## 2016-02-07 NOTE — Discharge Instructions (Signed)
Please discharge patient when stable, will follow up today with Dr. June LeapWrzosek at the [facility] office.  Leave shield in place until visit.  Monitored Anesthesia Care Monitored anesthesia care is an anesthesia service for a medical procedure. Anesthesia is the loss of the ability to feel pain. It is produced by medicines called anesthetics. It may affect a small area of your body (local anesthesia), a large area of your body (regional anesthesia), or your entire body (general anesthesia). The need for monitored anesthesia care depends your procedure, your condition, and the potential need for regional or general anesthesia. It is often provided during procedures where:   General anesthesia may be needed if there are complications. This is because you need special care when you are under general anesthesia.   You will be under local or regional anesthesia. This is so that you are able to have higher levels of anesthesia if needed.   You will receive calming medicines (sedatives). This is especially the case if sedatives are given to put you in a semi-conscious state of relaxation (deep sedation). This is because the amount of sedative needed to produce this state can be hard to predict. Too much of a sedative can produce general anesthesia. Monitored anesthesia care is performed by one or more health care providers who have special training in all types of anesthesia. You will need to meet with these health care providers before your procedure. During this meeting, they will ask you about your medical history. They will also give you instructions to follow. (For example, you will need to stop eating and drinking before your procedure. You may also need to stop or change medicines you are taking.) During your procedure, your health care providers will stay with you. They will:   Watch your condition. This includes watching your blood pressure, breathing, and level of pain.   Diagnose and treat  problems that occur.   Give medicines if they are needed. These may include calming medicines (sedatives) and anesthetics.   Make sure you are comfortable.  Having monitored anesthesia care does not necessarily mean that you will be under anesthesia. It does mean that your health care providers will be able to manage anesthesia if you need it or if it occurs. It also means that you will be able to have a different type of anesthesia than you are having if you need it. When your procedure is complete, your health care providers will continue to watch your condition. They will make sure any medicines wear off before you are allowed to go home.    This information is not intended to replace advice given to you by your health care provider. Make sure you discuss any questions you have with your health care provider.   Document Released: 01/11/2005 Document Revised: 05/08/2014 Document Reviewed: 05/29/2012 Elsevier Interactive Patient Education [employer]2016 [employer] Inc[employer]

## 2016-02-07 NOTE — Anesthesia Preprocedure Evaluation (Signed)
Anesthesia Evaluation  Patient identified by MRN, date of birth, ID band Patient awake    Reviewed: Allergy & Precautions, NPO status , Patient's Chart, lab work & pertinent test results  Airway Mallampati: II  TM Distance: >3 FB Neck ROM: Full    Dental  (+) Teeth Intact, Missing   Pulmonary shortness of breath and with exertion, sleep apnea ,    breath sounds clear to auscultation       Cardiovascular hypertension, Pt. on medications + angina + CAD and + Peripheral Vascular Disease   Rhythm:Regular Rate:Normal     Neuro/Psych PSYCHIATRIC DISORDERS Anxiety  Neuromuscular disease    GI/Hepatic PUD, GERD  ,  Endo/Other  diabetes, Type 2, Insulin DependentHypothyroidism   Renal/GU      Musculoskeletal   Abdominal   Peds  Hematology  (+) anemia ,   Anesthesia Other Findings   Reproductive/Obstetrics                             Anesthesia Physical Anesthesia Plan  ASA: III  Anesthesia Plan: MAC   Post-op Pain Management:    Induction: Intravenous  Airway Management Planned: Nasal Cannula  Additional Equipment:   Intra-op Plan:   Post-operative Plan:   Informed Consent: I have reviewed the patients History and Physical, chart, labs and discussed the procedure including the risks, benefits and alternatives for the proposed anesthesia with the patient or authorized representative who has indicated his/her understanding and acceptance.     Plan Discussed with:   Anesthesia Plan Comments:         Anesthesia Quick Evaluation

## 2016-02-07 NOTE — Anesthesia Postprocedure Evaluation (Signed)
  Anesthesia Post-op Note  Patient: Francene BoyersFrances P Lindeman  Procedure(s) Performed: Procedure(s) (LRB): CATARACT EXTRACTION PHACO AND INTRAOCULAR LENS PLACEMENT LEFT EYE (Left)  Patient Location:  Short Stay  Anesthesia Type: MAC  Level of Consciousness: awake  Airway and Oxygen Therapy: Patient Spontanous Breathing  Post-op Pain: none  Post-op Assessment: Post-op Vital signs reviewed, Patient's Cardiovascular Status Stable, Respiratory Function Stable, Patent Airway, No signs of Nausea or vomiting and Pain level controlled  Post-op Vital Signs: Reviewed and stable  Complications: No apparent anesthesia complications Anesthesia Post Note  Patient: Donnetta SimpersFrances P Marone  Procedure(s) Performed: Procedure(s) (LRB): CATARACT EXTRACTION PHACO AND INTRAOCULAR LENS PLACEMENT LEFT EYE (Left)  Anesthesia Post Evaluation  Last Vitals:  Vitals:   02/07/16 0715 02/07/16 0758  BP: (!) 121/49 (!) 136/58  Pulse:  (!) 48  Resp: 18 20  Temp:  36.7 C    Last Pain:  Vitals:   02/07/16 0758  TempSrc: Oral  PainSc:                  Minerva AreolaYATES,Jaelin Fackler

## 2016-02-07 NOTE — Transfer of Care (Signed)
Immediate Anesthesia Transfer of Care Note  Patient: Jenna SimpersFrances P Riggenbach  Procedure(s) Performed: Procedure(s) (LRB): CATARACT EXTRACTION PHACO AND INTRAOCULAR LENS PLACEMENT LEFT EYE (Left)  Patient Location: Shortstay  Anesthesia Type: MAC  Level of Consciousness: awake  Airway & Oxygen Therapy: Patient Spontanous Breathing   Post-op Assessment: Report given to PACU RN, Post -op Vital signs reviewed and stable and Patient moving all extremities  Post vital signs: Reviewed and stable  Complications: No apparent anesthesia complications

## 2016-02-09 ENCOUNTER — Ambulatory Visit: Payer: Medicare Other | Admitting: "Endocrinology

## 2016-02-10 ENCOUNTER — Encounter (HOSPITAL_COMMUNITY): Payer: Self-pay | Admitting: Ophthalmology

## 2016-02-16 ENCOUNTER — Encounter (HOSPITAL_COMMUNITY)
Admission: RE | Admit: 2016-02-16 | Discharge: 2016-02-16 | Disposition: A | Discharge status: Home / Self Care | Payer: Medicare Other | Source: Ambulatory Visit | Attending: Ophthalmology | Admitting: Ophthalmology

## 2016-02-16 ENCOUNTER — Encounter (HOSPITAL_COMMUNITY): Payer: Self-pay

## 2016-02-21 ENCOUNTER — Other Ambulatory Visit: Payer: Self-pay | Admitting: "Endocrinology

## 2016-02-23 MED ORDER — PHENYLEPHRINE HCL 2.5 % OP SOLN
OPHTHALMIC | Status: AC
  Filled 2016-02-23: qty 15

## 2016-02-23 MED ORDER — LIDOCAINE HCL (PF) 1 % IJ SOLN
INTRAMUSCULAR | Status: AC
  Filled 2016-02-23: qty 2

## 2016-02-23 MED ORDER — LIDOCAINE HCL 3.5 % OP GEL
OPHTHALMIC | Status: AC
  Filled 2016-02-23: qty 1

## 2016-02-23 MED ORDER — CYCLOPENTOLATE-PHENYLEPHRINE 0.2-1 % OP SOLN
OPHTHALMIC | Status: AC
  Filled 2016-02-23: qty 2

## 2016-02-23 MED ORDER — NEOMYCIN-POLYMYXIN-DEXAMETH 3.5-10000-0.1 OP SUSP
OPHTHALMIC | Status: AC
  Filled 2016-02-23: qty 5

## 2016-02-23 MED ORDER — TETRACAINE HCL 0.5 % OP SOLN
OPHTHALMIC | Status: AC
  Filled 2016-02-23: qty 4

## 2016-02-24 ENCOUNTER — Encounter (HOSPITAL_COMMUNITY)
Admission: RE | Disposition: A | Discharge status: Home / Self Care | Payer: Self-pay | Source: Ambulatory Visit | Attending: Ophthalmology

## 2016-02-24 ENCOUNTER — Ambulatory Visit (HOSPITAL_COMMUNITY): Payer: Medicare Other | Admitting: Anesthesiology

## 2016-02-24 ENCOUNTER — Encounter (HOSPITAL_COMMUNITY): Payer: Self-pay | Admitting: *Deleted

## 2016-02-24 ENCOUNTER — Ambulatory Visit (HOSPITAL_COMMUNITY)
Admission: RE | Admit: 2016-02-24 | Discharge: 2016-02-24 | Disposition: A | Discharge status: Home / Self Care | Payer: Medicare Other | Source: Ambulatory Visit | Attending: Ophthalmology | Admitting: Ophthalmology

## 2016-02-24 DIAGNOSIS — Z7982 Long term (current) use of aspirin: Secondary | ICD-10-CM | POA: Diagnosis not present

## 2016-02-24 DIAGNOSIS — E039 Hypothyroidism, unspecified: Secondary | ICD-10-CM | POA: Insufficient documentation

## 2016-02-24 DIAGNOSIS — D649 Anemia, unspecified: Secondary | ICD-10-CM | POA: Insufficient documentation

## 2016-02-24 DIAGNOSIS — I2510 Atherosclerotic heart disease of native coronary artery without angina pectoris: Secondary | ICD-10-CM | POA: Insufficient documentation

## 2016-02-24 DIAGNOSIS — F419 Anxiety disorder, unspecified: Secondary | ICD-10-CM | POA: Diagnosis not present

## 2016-02-24 DIAGNOSIS — E1151 Type 2 diabetes mellitus with diabetic peripheral angiopathy without gangrene: Secondary | ICD-10-CM | POA: Diagnosis not present

## 2016-02-24 DIAGNOSIS — Z794 Long term (current) use of insulin: Secondary | ICD-10-CM | POA: Diagnosis not present

## 2016-02-24 DIAGNOSIS — K219 Gastro-esophageal reflux disease without esophagitis: Secondary | ICD-10-CM | POA: Insufficient documentation

## 2016-02-24 DIAGNOSIS — Z7951 Long term (current) use of inhaled steroids: Secondary | ICD-10-CM | POA: Insufficient documentation

## 2016-02-24 DIAGNOSIS — Z79899 Other long term (current) drug therapy: Secondary | ICD-10-CM | POA: Insufficient documentation

## 2016-02-24 DIAGNOSIS — H269 Unspecified cataract: Secondary | ICD-10-CM | POA: Insufficient documentation

## 2016-02-24 DIAGNOSIS — E1136 Type 2 diabetes mellitus with diabetic cataract: Secondary | ICD-10-CM | POA: Insufficient documentation

## 2016-02-24 DIAGNOSIS — I10 Essential (primary) hypertension: Secondary | ICD-10-CM | POA: Diagnosis not present

## 2016-02-24 DIAGNOSIS — G4730 Sleep apnea, unspecified: Secondary | ICD-10-CM | POA: Insufficient documentation

## 2016-02-24 HISTORY — PX: CATARACT EXTRACTION W/PHACO: SHX586

## 2016-02-24 LAB — GLUCOSE, CAPILLARY: GLUCOSE-CAPILLARY: 166 mg/dL — AB (ref 65–99)

## 2016-02-24 SURGERY — PHACOEMULSIFICATION, CATARACT, WITH IOL INSERTION
Anesthesia: Monitor Anesthesia Care | Site: Eye | Laterality: Right

## 2016-02-24 MED ORDER — FENTANYL CITRATE (PF) 100 MCG/2ML IJ SOLN
INTRAMUSCULAR | Status: AC
  Filled 2016-02-24: qty 2

## 2016-02-24 MED ORDER — SODIUM HYALURONATE 23 MG/ML IO SOLN
INTRAOCULAR | Status: DC | PRN
  Administered 2016-02-24: 0.6 mL via INTRAOCULAR

## 2016-02-24 MED ORDER — MIDAZOLAM HCL 2 MG/2ML IJ SOLN
INTRAMUSCULAR | Status: AC
  Filled 2016-02-24: qty 2

## 2016-02-24 MED ORDER — CYCLOPENTOLATE-PHENYLEPHRINE 0.2-1 % OP SOLN
1.0000 [drp] | OPHTHALMIC | Status: AC
  Administered 2016-02-24 (×3): 1 [drp] via OPHTHALMIC

## 2016-02-24 MED ORDER — MIDAZOLAM HCL 5 MG/5ML IJ SOLN
INTRAMUSCULAR | Status: DC | PRN
  Administered 2016-02-24: 1 mg via INTRAVENOUS

## 2016-02-24 MED ORDER — EPINEPHRINE PF 1 MG/ML IJ SOLN
INTRAMUSCULAR | Status: AC
  Filled 2016-02-24: qty 1

## 2016-02-24 MED ORDER — FENTANYL CITRATE (PF) 100 MCG/2ML IJ SOLN
25.0000 ug | INTRAMUSCULAR | Status: AC | PRN
  Administered 2016-02-24 (×2): 25 ug via INTRAVENOUS

## 2016-02-24 MED ORDER — BSS IO SOLN
INTRAOCULAR | Status: DC | PRN
  Administered 2016-02-24: 15 mL via INTRAOCULAR

## 2016-02-24 MED ORDER — PROVISC 10 MG/ML IO SOLN
INTRAOCULAR | Status: DC | PRN
  Administered 2016-02-24: 0.85 mL via INTRAOCULAR

## 2016-02-24 MED ORDER — LIDOCAINE HCL (PF) 1 % IJ SOLN
INTRAOCULAR | Status: DC | PRN
  Administered 2016-02-24: 1 mL via OPHTHALMIC

## 2016-02-24 MED ORDER — NEOMYCIN-POLYMYXIN-DEXAMETH 3.5-10000-0.1 OP SUSP
OPHTHALMIC | Status: DC | PRN
  Administered 2016-02-24: 1 [drp] via OPHTHALMIC

## 2016-02-24 MED ORDER — MIDAZOLAM HCL 2 MG/2ML IJ SOLN
1.0000 mg | INTRAMUSCULAR | Status: DC | PRN
  Administered 2016-02-24: 2 mg via INTRAVENOUS

## 2016-02-24 MED ORDER — PHENYLEPHRINE HCL 2.5 % OP SOLN
1.0000 [drp] | OPHTHALMIC | Status: AC
  Administered 2016-02-24 (×3): 1 [drp] via OPHTHALMIC

## 2016-02-24 MED ORDER — EPINEPHRINE PF 1 MG/ML IJ SOLN
INTRAOCULAR | Status: DC | PRN
  Administered 2016-02-24: 08:00:00

## 2016-02-24 MED ORDER — LACTATED RINGERS IV SOLN
INTRAVENOUS | Status: DC
  Administered 2016-02-24: 07:00:00 via INTRAVENOUS

## 2016-02-24 MED ORDER — MIDAZOLAM HCL 2 MG/2ML IJ SOLN
INTRAMUSCULAR | Status: AC
Start: 2016-02-24 — End: 2016-02-24
  Filled 2016-02-24: qty 2

## 2016-02-24 MED ORDER — LIDOCAINE HCL 3.5 % OP GEL
1.0000 "application " | Freq: Once | OPHTHALMIC | Status: AC
  Administered 2016-02-24: 1 via OPHTHALMIC

## 2016-02-24 MED ORDER — LIDOCAINE HCL 3.5 % OP GEL
OPHTHALMIC | Status: DC | PRN
  Administered 2016-02-24: 1 via OPHTHALMIC

## 2016-02-24 MED ORDER — TETRACAINE HCL 0.5 % OP SOLN
1.0000 [drp] | OPHTHALMIC | Status: AC
  Administered 2016-02-24 (×3): 1 [drp] via OPHTHALMIC

## 2016-02-24 MED ORDER — POVIDONE-IODINE 5 % OP SOLN
OPHTHALMIC | Status: DC | PRN
  Administered 2016-02-24: 1 via OPHTHALMIC

## 2016-02-24 SURGICAL SUPPLY — 15 items
CLOTH BEACON ORANGE TIMEOUT ST (SAFETY) ×2 IMPLANT
EYE SHIELD UNIVERSAL CLEAR (GAUZE/BANDAGES/DRESSINGS) ×2 IMPLANT
GLOVE BIOGEL PI IND STRL 6.5 (GLOVE) IMPLANT
GLOVE BIOGEL PI IND STRL 7.0 (GLOVE) IMPLANT
GLOVE BIOGEL PI IND STRL 7.5 (GLOVE) IMPLANT
GLOVE BIOGEL PI INDICATOR 6.5 (GLOVE)
GLOVE BIOGEL PI INDICATOR 7.0 (GLOVE) ×2
GLOVE BIOGEL PI INDICATOR 7.5 (GLOVE)
GLOVE EXAM NITRILE LRG STRL (GLOVE) IMPLANT
GLOVE EXAM NITRILE MD LF STRL (GLOVE) ×2 IMPLANT
PAD ARMBOARD 7.5X6 YLW CONV (MISCELLANEOUS) ×2 IMPLANT
SIGHTPATH CAT PROC W REG LENS (Ophthalmic Related) ×2 IMPLANT
SYR TB 1ML LL NO SAFETY (SYRINGE) ×3 IMPLANT
VISCOELASTIC ADDITIONAL (OPHTHALMIC RELATED) ×3 IMPLANT
WATER STERILE IRR 250ML POUR (IV SOLUTION) ×2 IMPLANT

## 2016-02-24 NOTE — Anesthesia Preprocedure Evaluation (Signed)
Anesthesia Evaluation  Patient identified by MRN, date of birth, ID band Patient awake    Reviewed: Allergy & Precautions, NPO status , Patient's Chart, lab work & pertinent test results  Airway Mallampati: II  TM Distance: >3 FB Neck ROM: Full    Dental  (+) Teeth Intact, Missing   Pulmonary shortness of breath and with exertion, sleep apnea ,    breath sounds clear to auscultation       Cardiovascular hypertension, Pt. on medications + angina + CAD and + Peripheral Vascular Disease   Rhythm:Regular Rate:Normal     Neuro/Psych PSYCHIATRIC DISORDERS Anxiety  Neuromuscular disease    GI/Hepatic PUD, GERD  ,  Endo/Other  diabetes, Type 2, Insulin DependentHypothyroidism   Renal/GU      Musculoskeletal   Abdominal   Peds  Hematology  (+) anemia ,   Anesthesia Other Findings   Reproductive/Obstetrics                             Anesthesia Physical Anesthesia Plan  ASA: III  Anesthesia Plan: MAC   Post-op Pain Management:    Induction: Intravenous  Airway Management Planned: Nasal Cannula  Additional Equipment:   Intra-op Plan:   Post-operative Plan:   Informed Consent: I have reviewed the patients History and Physical, chart, labs and discussed the procedure including the risks, benefits and alternatives for the proposed anesthesia with the patient or authorized representative who has indicated his/her understanding and acceptance.     Plan Discussed with:   Anesthesia Plan Comments:         Anesthesia Quick Evaluation  

## 2016-02-24 NOTE — Discharge Instructions (Signed)
Please discharge patient when stable, will follow up today with Dr. Cheyenna Pankowski at the [facility] office.  Leave shield in place until visit. ° °

## 2016-02-24 NOTE — Interval H&P Note (Signed)
History and Physical Interval Note:  02/24/2016 7:22 AM  Jenna Diaz  has presented today for surgery, with the diagnosis of nuclear cataract right eye  The various methods of treatment have been discussed with the patient and family. After consideration of risks, benefits and other options for treatment, the patient has consented to  Procedure(s) with comments: CATARACT EXTRACTION PHACO AND INTRAOCULAR LENS PLACEMENT (IOC) CDE (Right) - right as a surgical intervention .  The patient's history has been reviewed, patient examined, no change in status, stable for surgery.  I have reviewed the patient's chart and labs.  Questions were answered to the patient's satisfaction.     Fabio PierceWRZOSEK, Lauraine Crespo

## 2016-02-24 NOTE — Transfer of Care (Signed)
Immediate Anesthesia Transfer of Care Note  Patient: Jenna Diaz  Procedure(s) Performed: Procedure(s) with comments: CATARACT EXTRACTION PHACO AND INTRAOCULAR LENS PLACEMENT (IOC) CDE 4.37 (Right) - right  Patient Location: Short Stay  Anesthesia Type:MAC  Level of Consciousness: awake  Airway & Oxygen Therapy: Patient Spontanous Breathing  Post-op Assessment: Report given to RN  Post vital signs: Reviewed  Last Vitals:  Vitals:   02/24/16 0720 02/24/16 0725  BP: 134/63 130/62  Pulse:    Resp: 13 13  Temp:      Last Pain:  Vitals:   02/24/16 0643  TempSrc: Oral      Patients Stated Pain Goal: 8 (02/24/16 [id number])  Complications: No apparent anesthesia complications

## 2016-02-24 NOTE — Anesthesia Postprocedure Evaluation (Signed)
Anesthesia Post Note  Patient: Jenna Diaz  Procedure(s) Performed: Procedure(s) (LRB): CATARACT EXTRACTION PHACO AND INTRAOCULAR LENS PLACEMENT (IOC) CDE 4.37 (Right)  Patient location during evaluation: Short Stay Anesthesia Type: MAC Level of consciousness: awake and alert and oriented Pain management: pain level controlled Vital Signs Assessment: post-procedure vital signs reviewed and stable Respiratory status: spontaneous breathing Cardiovascular status: blood pressure returned to baseline Postop Assessment: no signs of nausea or vomiting Anesthetic complications: no    Last Vitals:  Vitals:   02/24/16 0720 02/24/16 0725  BP: 134/63 130/62  Pulse:    Resp: 13 13  Temp:      Last Pain:  Vitals:   02/24/16 0643  TempSrc: Oral                 Kyria Bumgardner

## 2016-02-24 NOTE — Op Note (Signed)
Date of procedure: 02/24/16  Pre-operative diagnosis: Visually significant cataract, Right Eye  Post-operative diagnosis: Visually significant cataract, Right Eye  Procedure: Removal of cataract via phacoemulsification and insertion of intra-ocular lens Hoya 250 +21.5D into the capsular bag of the Right Eye  Attending surgeon: Rudy JewJames A. Faizaan Falls, MD, MA  Anesthesia: MAC, Topical Akten  Complications: None  Estimated Blood Loss: <351mL (minimal)  Specimens: None  Implants: As above  Indications:  Visually significant cataract, Right Eye  Procedure:  The patient was seen and identified in the pre-operative area. The operative eye was identified and dilated.  The operative eye was marked.  Topical anesthesia was administered to the operative eye.     The patient was then to the operative suite and placed in the supine position.  A timeout was performed confirming the patient, procedure to be performed, and all other relevant information.   The patient's face was prepped and draped in the usual fashion for intra-ocular surgery.  A lid speculum was placed into the operative eye and the surgical microscope moved into place and focused.  A superotemporal paracentesis was created using a 15 degree blade.  Shugarcaine was injected into the anterior chamber.  Viscoelastic was injected into the anterior chamber.  A temporal clear-corneal main wound incision was created using a 2.504mm microkeratome.  A continuous curvilinear capsulorrhexis was initiated using an irrigating cystitome and completed using capsulorrhexis forceps.  Hydrodissection and hydrodeliniation were performed.  Viscoelastic was injected into the anterior chamber.  A phacoemulsification handpiece and a chopper as a second instrument were used to remove the nucleus and epinucleus. The irrigation/aspiration handpiece was used to remove any remaining cortical material.   The capsular bag was reinflated with viscoelastic, checked, and found  to be intact. A Hoya model 250 lens was inserted into the capsular bag and dialed into place using a Sinskey hook.  The irrigation/aspiration handpiece was used to remove any remaining viscoelastic.  The clear corneal wound and paracentesis wounds were then hydrated and checked with Weck-Cels to be watertight.  The lid-speculum and drape was removed, and the patient's face was cleaned with a wet and dry 4x4.  Maxitrol was instilled in the eye before a clear shield was taped over the eye. The patient was taken to the post-operative care unit in good condition, having tolerated the procedure well.  Post-Op Instructions: The patient [facility] for a same day post-operative evaluation and will receive all other orders and instructions.

## 2016-02-24 NOTE — H&P (View-Only) (Signed)
The H and P was reviewed and updated. The patient was examined.  No changes were found after exam.  The surgical eye was marked.  

## 2016-02-28 ENCOUNTER — Encounter (HOSPITAL_COMMUNITY): Payer: Self-pay | Admitting: Ophthalmology

## 2016-08-11 ENCOUNTER — Encounter: Payer: Self-pay | Admitting: Cardiology

## 2016-08-15 ENCOUNTER — Encounter: Payer: Self-pay | Admitting: Cardiology

## 2017-03-19 ENCOUNTER — Encounter: Payer: Self-pay | Admitting: Cardiology

## 2017-05-30 ENCOUNTER — Ambulatory Visit: Payer: Medicare Other | Admitting: Internal Medicine

## 2017-06-25 ENCOUNTER — Encounter: Payer: Self-pay | Admitting: Internal Medicine

## 2017-06-25 ENCOUNTER — Ambulatory Visit: Payer: Medicare Other | Admitting: Internal Medicine

## 2017-06-25 VITALS — BP 110/60 | HR 70 | Ht 68.0 in | Wt 266.0 lb

## 2017-06-25 DIAGNOSIS — K219 Gastro-esophageal reflux disease without esophagitis: Secondary | ICD-10-CM

## 2017-06-25 DIAGNOSIS — R0789 Other chest pain: Secondary | ICD-10-CM

## 2017-06-25 NOTE — Patient Instructions (Signed)
Please follow up with Dr Marina GoodellPerry as needed.  If you are age 70 or older, your body mass index should be between 23-30. Your Body mass index is 40.45 kg/m. If this is out of the aforementioned range listed, please consider follow up with your Primary Care Provider.  If you are age 70 or younger, your body mass index should be between 19-25. Your Body mass index is 40.45 kg/m. If this is out of the aformentioned range listed, please consider follow up with your Primary Care Provider.

## 2017-06-25 NOTE — Progress Notes (Signed)
HISTORY OF PRESENT ILLNESS:  Jenna Diaz is a 72 y.o. female with multiple medical problems who is referred by her primary care physician Dr. Sherryll BurgerShah for evaluation of chest pain. The patient has a history of total colectomy with ileostomy elsewhere remotely (cause uncertain), gastric ulcer followed to healing, and dysphagia likely secondary to dysmotility. Also history of large gastric polyp removed endoscopically. Her chief complaint is 6 weeks of chest pain which comes and goes. Typically last 15-20 minutes. NOT affected by meals. Clearly affected by movement and direct palpation. She drinks warm water and moves around hoping this will help. She had on PPI twice daily without change in symptoms. Now on PPI once daily. No active reflux symptoms. She will experience nocturnal regurgitation, particularly when eating large meals late. She has been unsuccessful losing weight. Current BMI is greater than 40. She tells me that she has upcoming cardiology evaluation already scheduled given her history of coronary artery disease  REVIEW OF SYSTEMS:  All non-GI ROS negative except for arthritis, cough, fatigue  Past Medical History:  Diagnosis Date  . Anemia   . Anxiety   . Bowel obstruction (HCC) 1997  . Bursitis   . CAD (coronary artery disease)    A. 11/2010 - Cath: 60% LAD, 50% RCA ;  B. s/p Promus DES to LAD (by FFR 12/12);  RCA ok by FFR 12/12  . Carpal tunnel syndrome   . Cholelithiasis   . Chronic kidney disease    questionable  . Diabetes mellitus   . Esophageal stricture   . Essential hypertension   . Ganglion cyst   . Gastric ulcer   . Gastroparesis   . GERD (gastroesophageal reflux disease)    PUD with UGI bleed in 8/12 dx by EGD  . Hyperlipemia   . Hypertension   . Hypothyroidism   . Morbid obesity (HCC)   . Osteoarthritis   . Peripheral vascular disease (HCC)   . Shortness of breath    with exertion  . Sleep apnea    CPAP, pt does not knoe settings    Past Surgical  History:  Procedure Laterality Date  . ABDOMINAL HYSTERECTOMY  1970's  . CATARACT EXTRACTION W/PHACO Left 02/07/2016   Procedure: CATARACT EXTRACTION PHACO AND INTRAOCULAR LENS PLACEMENT LEFT EYE;  Surgeon: Fabio PierceJames Wrzosek, MD;  Location: [facility];  Service: Ophthalmology;  Laterality: Left;  CDE: 4.12  . CATARACT EXTRACTION W/PHACO Right 02/24/2016   Procedure: CATARACT EXTRACTION PHACO AND INTRAOCULAR LENS PLACEMENT (IOC) CDE 4.37;  Surgeon: Fabio PierceJames Wrzosek, MD;  Location: [facility];  Service: Ophthalmology;  Laterality: Right;  right  . COLON RESECTION  1990's  . COLON SURGERY     COLONECTOMY WEARS A BAG  . DILATION AND CURETTAGE OF UTERUS     "before hysterectomy"  . ESOPHAGOGASTRODUODENOSCOPY N/A 01/18/2016   Procedure: ESOPHAGOGASTRODUODENOSCOPY (EGD);  Surgeon: Hilarie FredricksonJohn N Avid Guillette, MD;  [facility];  Service: Endoscopy;  Laterality: N/A;  . GASTROSTOMY W/ FEEDING TUBE  1990's   later removed  . HAND SURGERY     cyst on left hand  . LEFT AND RIGHT HEART CATHETERIZATION WITH CORONARY ANGIOGRAM N/A 04/28/2011   Procedure: LEFT AND RIGHT HEART CATHETERIZATION WITH CORONARY ANGIOGRAM;  Surgeon: Tonny BollmanMichael Cooper, MD;  [facility];  Service: Cardiovascular;  Laterality: N/A;  . NISSEN FUNDOPLICATION     questionable - pt unsure of surgical name  . UPPER GASTROINTESTINAL ENDOSCOPY      Social History Jenna Diaz  reports that  has never smoked. she has never used smokeless tobacco. She reports that she does not drink alcohol or use drugs.  family history includes Diabetes in her sister; Esophageal cancer in her father; Heart disease in her brother, maternal grandmother, mother, and paternal grandmother; Ovarian cancer in her sister.  No Known Allergies     PHYSICAL EXAMINATION: Vital signs: BP 110/60   Pulse 70   Ht 5\' 8"  (1.727 m)   Wt 266 lb (120.7 kg)   BMI 40.45 kg/m   Constitutional: Pleasant, obese generally well-appearing, no acute distress Psychiatric: alert  and oriented x3, cooperative Eyes: extraocular movements intact, anicteric, conjunctiva pink Mouth: oral pharynx moist, no lesions Neck: supple no lymphadenopathy Chest: Palpation over the sternum slightly left of center reproduces her pain Cardiovascular: heart regular rate and rhythm, no murmur Lungs: clear to auscultation bilaterally Abdomen: soft, obese, nontender, nondistended, no obvious ascites, no peritoneal signs, normal bowel sounds, no organomegaly. Healthy ostomy in place Rectal: Omitted Extremities: no clubbing, cyanosis, or lower extremity edema bilaterally Skin: no lesions on visible extremities Neuro: No focal deficits. Cranial nerves intact  ASSESSMENT:  #1. Musculoskeletal chest pain #2. GERD. At night regurgitation.  #3. Remote total colectomy with ileostomy elsewhere (reason uncertain)   PLAN:  #1. Reassurance regarding musculoskeletal chest pain #2. Return to PCP regarding symptomatic management #3. Reflux precautions with attention to weight loss and avoiding meals within 3 hours of bedtime #4. GI follow-up as needed 25 minutes spent face-to-face with the patient. Greater than 50% a time use for counseling regarding the nature musculoskeletal chest pain as well as recommendations for management of her nocturnal regurgitation related to GERD

## 2017-09-06 ENCOUNTER — Telehealth: Payer: Self-pay | Admitting: *Deleted

## 2017-09-06 NOTE — Telephone Encounter (Signed)
Left message 09/05/17 & 09/06/17 for patient to call and reschedule 09/14/17 [facility] location) appointment---Dr. Antoine Poche not in [place] on that day

## 2017-09-14 ENCOUNTER — Ambulatory Visit: Payer: Medicare Other | Admitting: Cardiology

## 2017-10-03 IMAGING — RF DG ESOPHAGUS
14 of 23 series · 14 of 24 positions shown · non-contrast
Comparison: None.

CLINICAL DATA: Dysphagia weight loss

EXAM:
ESOPHOGRAM / BARIUM SWALLOW / BARIUM TABLET STUDY
TECHNIQUE: Combined double contrast and single contrast examination performed
using effervescent crystals, thick barium liquid, and thin barium
liquid. The patient was observed with fluoroscopy swallowing a 13 mm
barium sulphate tablet.
FLUOROSCOPY TIME:  Radiation Exposure Index (as provided by the
fluoroscopic device):
If the device does not provide the exposure index:
Fluoroscopy Time:  3 minutes 40 second
Number of Acquired Images:

[Series 1: run · 1 of 22 slices shown (1 of 14)]
[im 1/22]
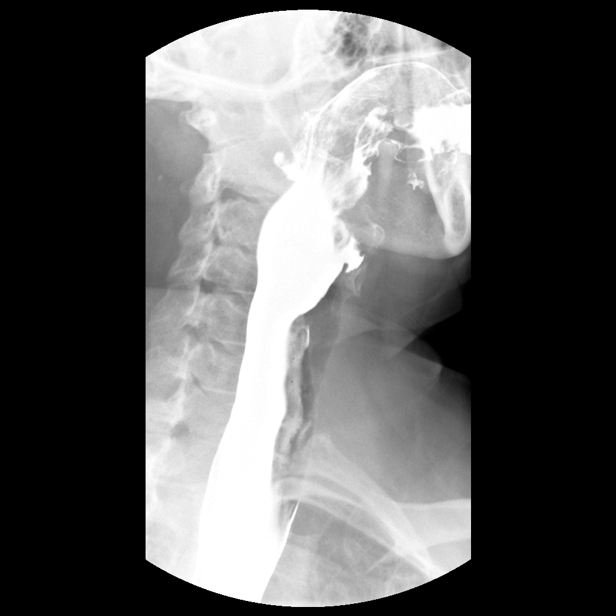

[Series 2: run · 1 of 1 slices shown (2 of 14)]
[im 1/1]
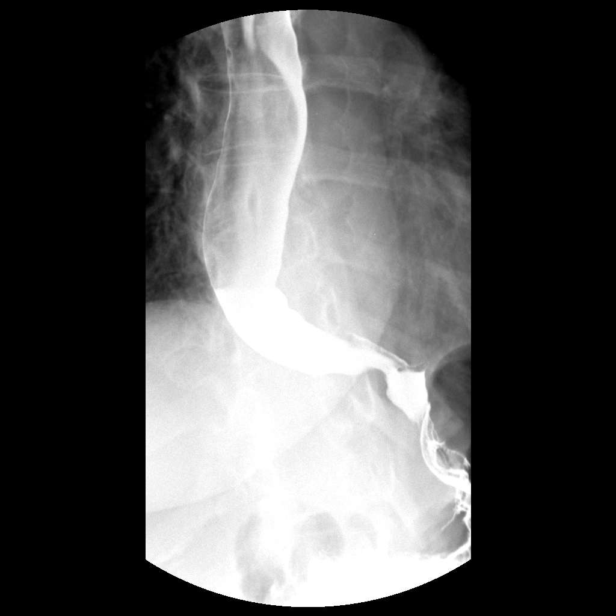

[Series 4: run · 1 of 1 slices shown (3 of 14)]
[im 1/1]
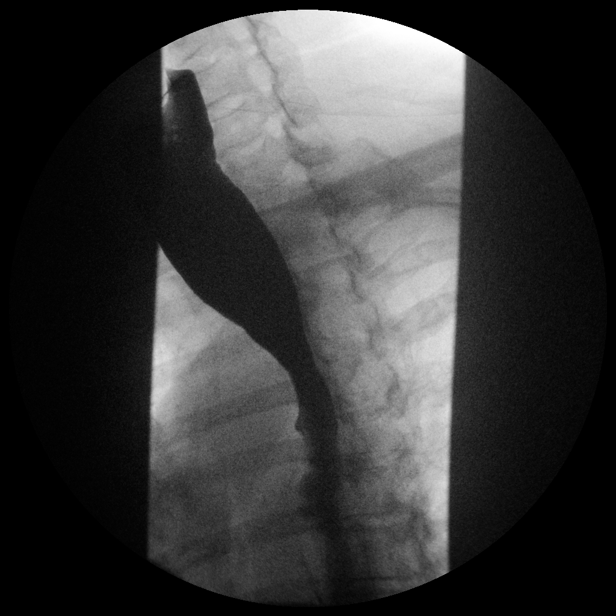

[Series 6: run · 1 of 1 slices shown (4 of 14)]
[im 1/1]
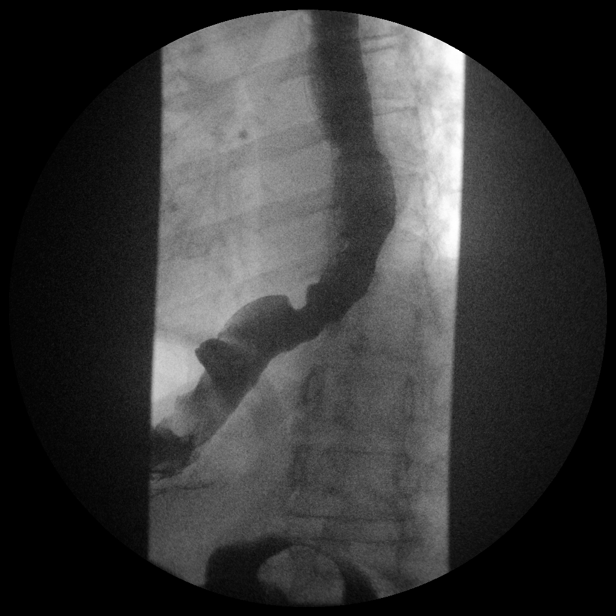

[Series 7: run · 1 of 1 slices shown (5 of 14)]
[im 1/1]
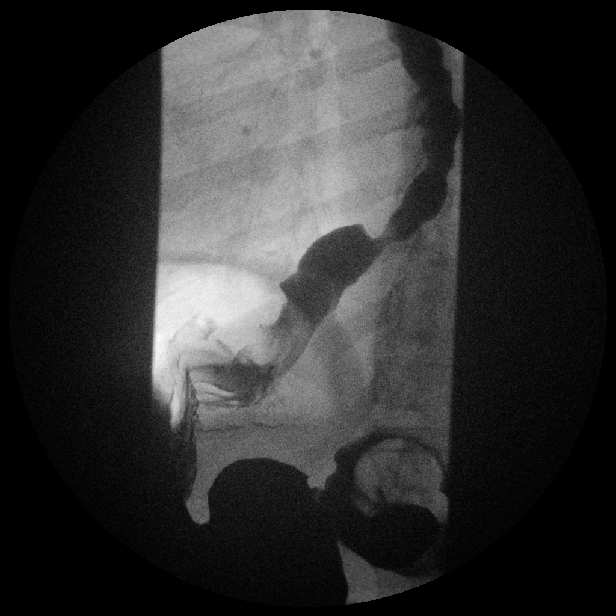

[Series 9: run · 1 of 2 slices shown (6 of 14)]
[im 1/2]
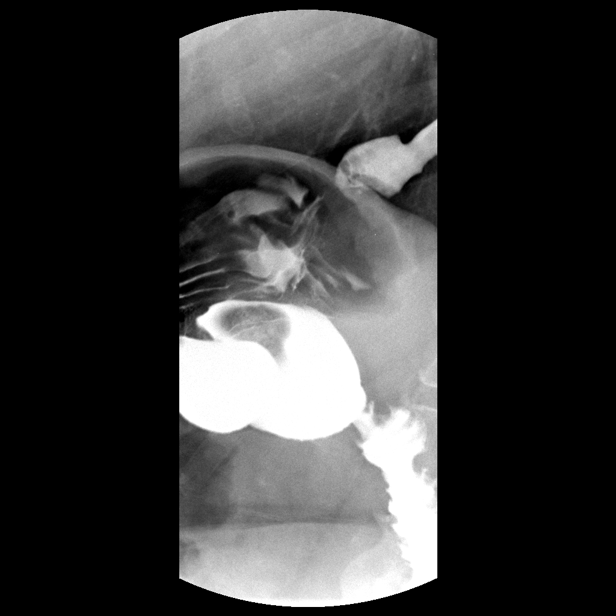

[Series 11: run · 1 of 1 slices shown (7 of 14)]
[im 1/1]
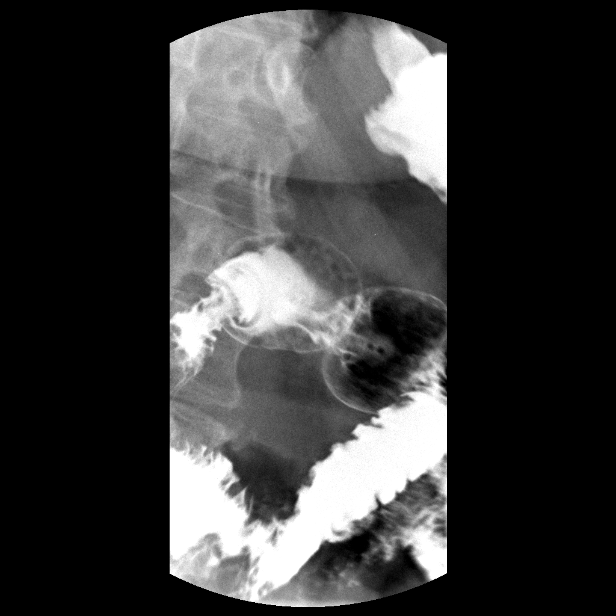

[Series 12: run · 1 of 1 slices shown (8 of 14)]
[im 1/1]
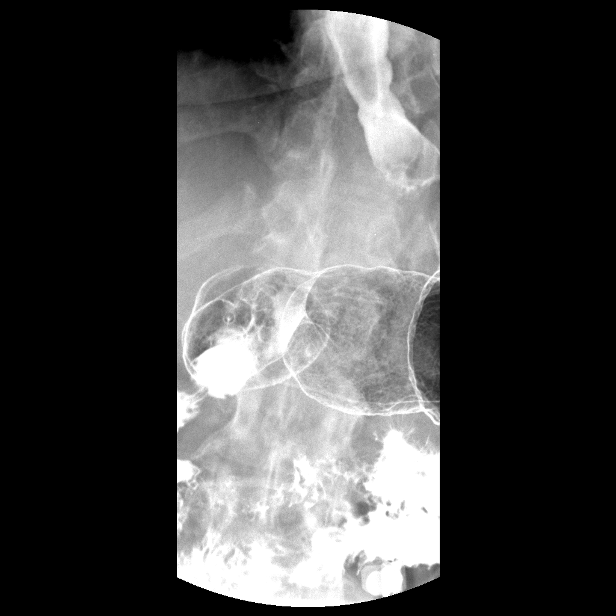

[Series 14: run · 1 of 1 slices shown (9 of 14)]
[im 1/1]
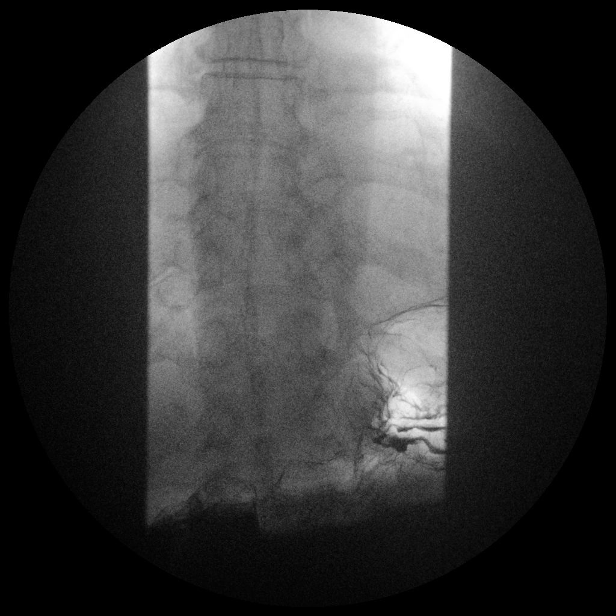

[Series 16: run · 1 of 1 slices shown (10 of 14)]
[im 1/1]
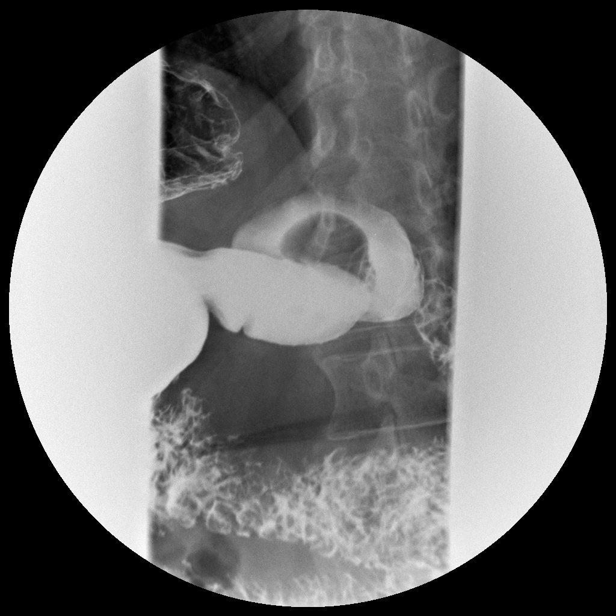

[Series 18: run · 1 of 1 slices shown (11 of 14)]
[im 1/1]
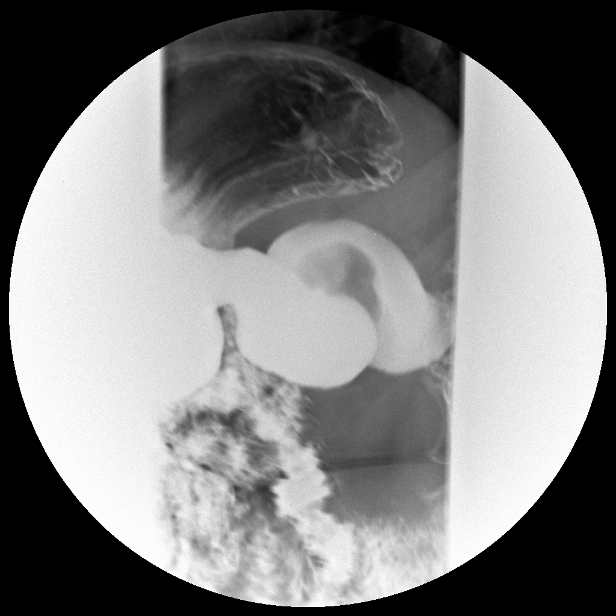

[Series 19: run · 1 of 1 slices shown (12 of 14)]
[im 1/1]
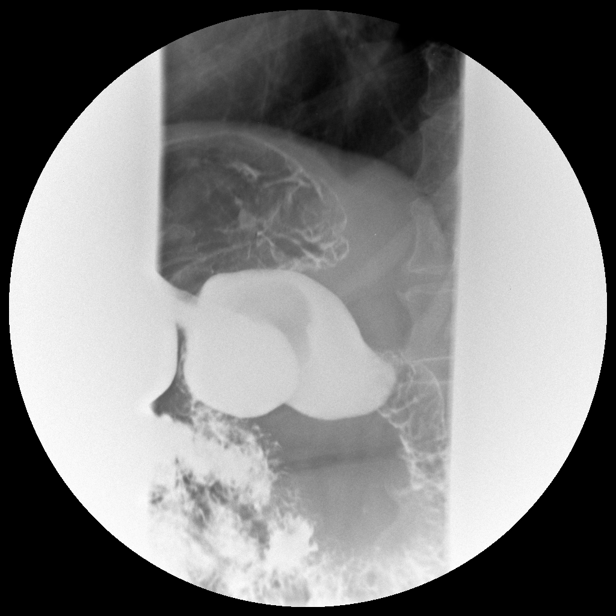

[Series 21: run · 1 of 1 slices shown (13 of 14)]
[im 1/1]
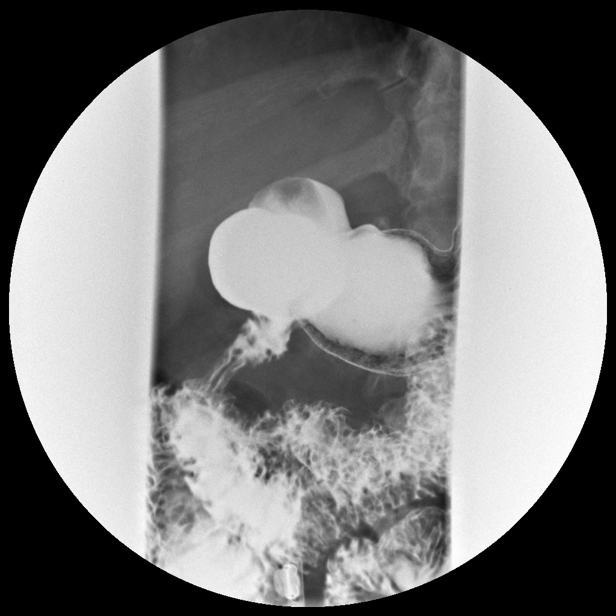

[Series 23: run · 1 of 1 slices shown (14 of 14)]
[im 1/1]
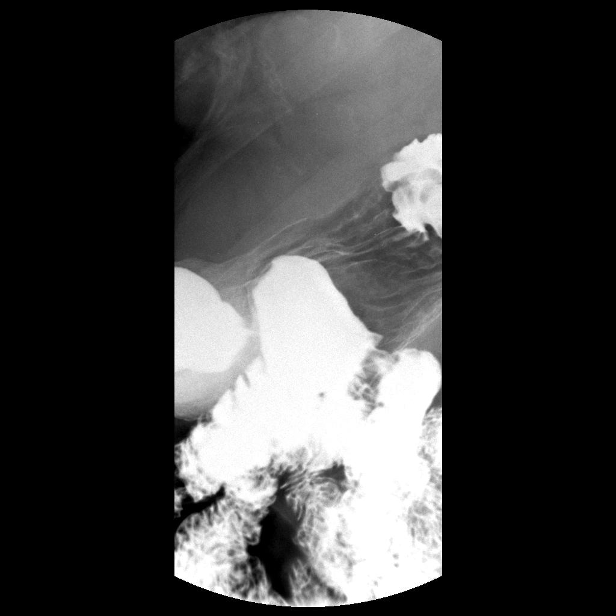

[14 of 24 positions shown; findings below may reference images not displayed]

FINDINGS: Decreased esophageal motility. Esophageal spasm noted in the mid and
distal esophagus with slow clearing of the esophagus. Esophagus is
diffusely distended. No stricture or mass lesion.

Barium tablet passed readily into the stomach without delay.

Duodenal bulb is distended. There is an apparent filling defect
within the duodenum bulb which is well-circumscribed and persistent
with the patient prone. This is not well seen with the patient
supine on air contrast views. This may represent a polyp or mass
lesion. Endoscopy recommended.
IMPRESSION: Esophageal spasm with poor motility. No stricture or mass in the
esophagus. Barium passed readily into the stomach

Apparent filling defect in the duodenal bulb measuring approximately
3 cm. This is well-circumscribed. Recommend endoscopy for further
evaluation.

## 2017-11-12 ENCOUNTER — Encounter: Payer: Self-pay | Admitting: Cardiology

## 2017-11-12 NOTE — Progress Notes (Signed)
Cardiology Office Note   Date:  11/14/2017   ID:  [name], [name] 02-Jun-1945, MRN [id number]  PCP:  Kirstie Peri, MD  Cardiologist:   No primary care provider on file.   Chief Complaint  Patient presents with  . Chest Pain      History of Present Illness: Jenna Diaz is a 72 y.o. female who presents for follow up of CAD.  I have not seen her since March 2016.  Cardiac cath in 2012 demonstrated a 60-70% mid LAD stenosis which was significant by FFR. The stenosis in the proximal RCA was not significant. She underwent an angioplasty and drug-eluting stent placement to the LAD without complications. In 2015 she had stress perfusion imaging which was negative for any evidence of ischemia with a well preserved ejection fraction.  This was done to evaluate increased DOE.   Apparently there was another Scientist, physiological at [facility] after this.  I don't have these results but she reports that it was "negative."  Since I last saw her she has had chest pain.  She says this is been going on for perhaps a year but it is hard to quantify.  She talks about some mid chest discomfort.  She thought this was somewhat related to reducing her Prilosec.  However, this was changed and her pains continued.  They seem to be rare now.  They are sporadic.  She might have some discomfort down into her arms.  She cannot bring it on.  She is not overly active but she does do grocery shopping without bringing this on.  She takes care of her 80-year-old and 13-year-old grandchild without bringing this on.  She saw Dr. Marina Goodell in GI and he thought this was musculoskeletal.  She did have a stress perfusion study at [facility].  I was able to get this result after calling during this office visit and the test was negative for ischemia.    .    Past Medical History:  Diagnosis Date  . Anemia   . Anxiety   . Bowel obstruction (HCC) 1997  . Bursitis   . CAD (coronary artery disease)    A. 11/2010 -  Cath: 60% LAD, 50% RCA ;  B. s/p Promus DES to LAD (by FFR 12/12);  RCA ok by FFR 12/12  . Carpal tunnel syndrome   . Cholelithiasis   . Chronic kidney disease    questionable  . Diabetes mellitus   . Esophageal stricture   . Essential hypertension   . Ganglion cyst   . Gastric ulcer   . Gastroparesis   . GERD (gastroesophageal reflux disease)    PUD with UGI bleed in 8/12 dx by EGD  . Hyperlipemia   . Hypertension   . Hypothyroidism   . Morbid obesity (HCC)   . Osteoarthritis   . Peripheral vascular disease (HCC)   . Sleep apnea    CPAP, pt does not knoe settings    Past Surgical History:  Procedure Laterality Date  . ABDOMINAL HYSTERECTOMY  1970's  . CATARACT EXTRACTION W/PHACO Left 02/07/2016   Procedure: CATARACT EXTRACTION PHACO AND INTRAOCULAR LENS PLACEMENT LEFT EYE;  Surgeon: Fabio Pierce, MD;  Location: [facility];  Service: Ophthalmology;  Laterality: Left;  CDE: 4.12  . CATARACT EXTRACTION W/PHACO Right 02/24/2016   Procedure: CATARACT EXTRACTION PHACO AND INTRAOCULAR LENS PLACEMENT (IOC) CDE 4.37;  Surgeon: Fabio Pierce, MD;  Location: [facility];  Service: Ophthalmology;  Laterality: Right;  right  .  COLON RESECTION  1990's  . COLON SURGERY     COLONECTOMY WEARS A BAG  . DILATION AND CURETTAGE OF UTERUS     "before hysterectomy"  . ESOPHAGOGASTRODUODENOSCOPY N/A 01/18/2016   Procedure: ESOPHAGOGASTRODUODENOSCOPY (EGD);  Surgeon: Hilarie Fredrickson, MD;  Location: [facility];  Service: Endoscopy;  Laterality: N/A;  . GASTROSTOMY W/ FEEDING TUBE  1990's   later removed  . HAND SURGERY     cyst on left hand  . LEFT AND RIGHT HEART CATHETERIZATION WITH CORONARY ANGIOGRAM N/A 04/28/2011   Procedure: LEFT AND RIGHT HEART CATHETERIZATION WITH CORONARY ANGIOGRAM;  Surgeon: Tonny Bollman, MD;  Location: [facility] CATH LAB;  Service: Cardiovascular;  Laterality: N/A;  . NISSEN FUNDOPLICATION     questionable - pt unsure of surgical name  . UPPER GASTROINTESTINAL ENDOSCOPY     Family  History  Problem Relation Age of Onset  . Heart disease Mother        MI  . Heart disease Maternal Grandmother   . Heart disease Paternal Grandmother   . Ovarian cancer Sister   . Diabetes Sister        x3  . Heart disease Brother   . Esophageal cancer Father   . Colon cancer Neg Hx   . Stomach cancer Neg Hx    Social History   Socioeconomic History  . Marital status: Single    Spouse name: Not on file  . Number of children: 3  . Years of education: Not on file  . Highest education level: Not on file  Occupational History  . Occupation: disability  Social Needs  . Financial resource strain: Not on file  . Food insecurity:    Worry: Not on file    Inability: Not on file  . Transportation needs:    Medical: Not on file    Non-medical: Not on file  Tobacco Use  . Smoking status: Never Smoker  . Smokeless tobacco: Never Used  Substance and Sexual Activity  . Alcohol use: No  . Drug use: No  . Sexual activity: Never  Lifestyle  . Physical activity:    Days per week: Not on file    Minutes per session: Not on file  . Stress: Not on file  Relationships  . Social connections:    Talks on phone: Not on file    Gets together: Not on file    Attends religious service: Not on file    Active member of club or organization: Not on file    Attends meetings of clubs or organizations: Not on file    Relationship status: Not on file  . Intimate partner violence:    Fear of current or ex partner: Not on file    Emotionally abused: Not on file    Physically abused: Not on file    Forced sexual activity: Not on file  Other Topics Concern  . Not on file  Social History Narrative  . Not on file     Current Outpatient Medications  Medication Sig Dispense Refill  . acetaminophen (TYLENOL) 500 MG tablet Take 1,000 mg by mouth every 6 (six) hours as needed for mild pain.     Marland Kitchen albuterol (PROAIR HFA) 108 (90 BASE) MCG/ACT inhaler Inhale 2 puffs into the lungs every 6 (six)  hours as needed for wheezing. 1 Inhaler 5  . amLODipine (NORVASC) 5 MG tablet Take 1 tablet (5 mg total) by mouth daily. Please schedule appointment for refills. 30 tablet 0  . aspirin EC  81 MG tablet Take 81 mg by mouth daily.     Marland Kitchen escitalopram (LEXAPRO) 20 MG tablet Take 20 mg by mouth daily.   2  . Ferrous Sulfate Dried (FEOSOL) 200 (65 FE) MG TABS Take 2 tablets by mouth 3 (three) times a week.     . fluticasone (FLONASE) 50 MCG/ACT nasal spray Place 2 sprays into both nostrils daily. 1 g 2  . furosemide (LASIX) 40 MG tablet Take 40 mg by mouth daily. For fluid retention    . gabapentin (NEURONTIN) 300 MG capsule Take 300 mg by mouth 3 (three) times daily.     . insulin lispro (HUMALOG) 100 UNIT/ML injection Inject 12-14 Units into the skin 3 (three) times daily before meals. 14 units at breakfast, 12 units at lunch, 14 units at dinner    . levothyroxine (SYNTHROID, LEVOTHROID) 150 MCG tablet Take 150 mcg by mouth daily.      Marland Kitchen loratadine (CLARITIN) 10 MG tablet Take 10 mg by mouth daily.    Marland Kitchen losartan (COZAAR) 50 MG tablet Take 1 tablet (50 mg total) by mouth daily. Please schedule appointment for refills. 30 tablet 0  . Menthol-Methyl Salicylate (MUSCLE RUB) 10-15 % CREA Apply 1 application topically as needed (for pain in hands).    . Multiple Vitamin (MULTIVITAMIN WITH MINERALS) TABS tablet Take 1 tablet by mouth daily.    . pantoprazole (PROTONIX) 40 MG tablet Take 40 mg by mouth 2 (two) times daily.     . potassium chloride SA (K-DUR,KLOR-CON) 20 MEQ tablet Take 20 mEq by mouth daily.   2  . pravastatin (PRAVACHOL) 80 MG tablet Take 80 mg by mouth at bedtime.    . prednisoLONE acetate (PRED FORTE) 1 % ophthalmic suspension Place 1 drop into the left eye 3 (three) times daily.   0  . TOUJEO SOLOSTAR 300 UNIT/ML SOPN Inject 40 Units as directed at bedtime.   2  . trimethoprim-polymyxin b (POLYTRIM) ophthalmic solution Place 1 drop into the left eye 4 (four) times daily.   1  . Insulin  Pen Needle 32G X 6 MM MISC 32 g by Does not apply route.    . nitroGLYCERIN (NITROSTAT) 0.4 MG SL tablet Place 1 tablet (0.4 mg total) under the tongue every 5 (five) minutes as needed for chest pain. 25 tablet 3   Current Facility-Administered Medications  Medication Dose Route Frequency Provider Last Rate Last Dose  . 0.9 %  sodium chloride infusion  500 mL Intravenous Continuous Hilarie Fredrickson, MD        Allergies:   Patient has no known allergies.    ROS:  Please see the history of present illness.   Otherwise, review of systems are positive for none.   All other systems are reviewed and negative.    PHYSICAL EXAM: VS:  BP 112/62   Pulse 80   Ht 5\' 8"  (1.727 m)   Wt 264 lb (119.7 kg)   BMI 40.14 kg/m  , BMI Body mass index is 40.14 kg/m. GENERAL:  Well appearing NECK:  No jugular venous distention, waveform within normal limits, carotid upstroke brisk and symmetric, no bruits, no thyromegaly LUNGS:  Clear to auscultation bilaterally BACK:  No CVA tenderness CHEST:  Unremarkable HEART:  PMI not displaced or sustained,S1 and S2 within normal limits, no S3, no S4, no clicks, no rubs, no murmurs ABD:  Flat, positive bowel sounds normal in frequency in pitch, no bruits, no rebound, no guarding, no midline pulsatile mass, no  hepatomegaly, no splenomegaly, colostomy bag.   EXT:  2 plus pulses throughout, mild edema, no cyanosis no clubbing   EKG:  EKG is ordered today. The ekg ordered today demonstrates sinus rhythm, rate 79, axis within normal limits, intervals within normal limits, no acute ST-T wave changes.   Recent Labs: No results found for requested labs within last 8760 hours.    Lipid Panel    Component Value Date/Time   CHOL 179 10/01/2015 0936   TRIG 130 10/01/2015 0936   HDL 66 10/01/2015 0936   LDLCALC 87 10/01/2015 0936      Wt Readings from Last 3 Encounters:  11/14/17 264 lb (119.7 kg)  06/25/17 266 lb (120.7 kg)  02/07/16 270 lb (122.5 kg)       Other studies Reviewed: Additional studies/ records that were reviewed today include: [employer], labs. Review of the above records demonstrates:  Please see elsewhere in the note.     ASSESSMENT AND PLAN:  CAD:    The patient's chest pain is atypical.  She had a negative stress perfusion study in November.  At this point I think the pain is rare and atypical and unchanged from the symptoms she was having in the fall when she had the stress test.  Therefore, I would not suggest further testing is indicated.  CKD: Her recent creatinine was 1.43.  This is stable and will be followed by her primary provider.  HYPERLIPIDEMIA: LDL was 114.  HDL was excellent at 80.  I think this is a reasonable profile and she will continue with these meds.  Current medicines are reviewed at length with the patient today.  The patient does not have concerns regarding medicines.  The following changes have been made:  no change  Labs/ tests ordered today include: None  Orders Placed This Encounter  Procedures  . EKG 12-Lead     Disposition:   FU with me as needed.      Signed, Rollene RotundaJames Loyda Costin, MD  11/14/2017 12:53 PM    [facility]

## 2017-11-14 ENCOUNTER — Encounter: Payer: Self-pay | Admitting: Cardiology

## 2017-11-14 ENCOUNTER — Ambulatory Visit: Payer: Medicare Other | Admitting: Cardiology

## 2017-11-14 VITALS — BP 112/62 | HR 80 | Ht 68.0 in | Wt 264.0 lb

## 2017-11-14 DIAGNOSIS — E785 Hyperlipidemia, unspecified: Secondary | ICD-10-CM | POA: Diagnosis not present

## 2017-11-14 DIAGNOSIS — I10 Essential (primary) hypertension: Secondary | ICD-10-CM | POA: Diagnosis not present

## 2017-11-14 DIAGNOSIS — I2510 Atherosclerotic heart disease of native coronary artery without angina pectoris: Secondary | ICD-10-CM

## 2017-11-14 NOTE — Patient Instructions (Signed)
Medication Instructions:  Please decrease Asprin to 81 mg once a day. Continue all other medications as listed.  Follow-Up: Follow up in 1 year with Dr. Antoine PocheHochrein.  You will receive a letter in the mail 2 months before you are due.  Please call us when you receive this letter to schedule your follow up appointment.  If you need a refill on your cardiac medications before your next appointment, please call your pharmacy.  Thank you for choosing [facility]!!

## 2019-01-13 DIAGNOSIS — N183 Chronic kidney disease, stage 3 unspecified: Secondary | ICD-10-CM | POA: Insufficient documentation

## 2019-01-13 DIAGNOSIS — I2510 Atherosclerotic heart disease of native coronary artery without angina pectoris: Secondary | ICD-10-CM | POA: Insufficient documentation

## 2019-01-13 NOTE — Progress Notes (Signed)
Cardiology Office Note   Date:  01/15/2019   ID:  Jenna Diaz, DOB Sep 01, 1945, MRN [id number]  PCP:  Kirstie PeriShah, Ashish, MD  Cardiologist:   No primary care provider on file.   Chief Complaint  Patient presents with  . Chest Pain      History of Present Illness: Jenna Diaz is a 73 y.o. female who presents for follow up of CAD.  Cardiac cath in 2012 demonstrated a 60-70% mid LAD stenosis which was significant by FFR. The stenosis in the proximal RCA was not significant. She underwent an angioplasty and drug-eluting stent placement to the LAD without complications. In 2015 she had stress perfusion imaging which was negative for any evidence of ischemia with a well preserved ejection fraction.  This was done to evaluate increased DOE.   There was another Scientist, physiologicalLexiscan Myoview [facility] after this.   This was negative for ischemia.   This was in 2018.  Since I last saw her she has been limited in her ability to get around.  States she has trouble doing a lot of bending and walking because of her ostomy bag.  She did have a fall and was kind of wedged between the toilet and the wall of her house and has had some shoulder discomfort and numbness related to this.  This was a mechanical event and there was no syncope.  She has had some chest discomfort.  This is midsternal.  It is mild to moderate.  It feels somewhat like it did before her angioplasty years ago.  She lets it pass.  She sometimes tries to drink some warm water.  She does not take nitroglycerin.  She is not very active his mother to bring this on.  She does not describe associated shortness of breath, nausea or vomiting.  She has had no palpitations, presyncope or syncope.  Past Medical History:  Diagnosis Date  . Anemia   . Anxiety   . Bowel obstruction (HCC) 1997  . Bursitis   . CAD (coronary artery disease)    A. 11/2010 - Cath: 60% LAD, 50% RCA ;  B. s/p Promus DES to LAD (by FFR 12/12);  RCA ok by FFR 12/12   . Carpal tunnel syndrome   . Cholelithiasis   . Chronic kidney disease    questionable  . Diabetes mellitus   . Esophageal stricture   . Essential hypertension   . Ganglion cyst   . Gastric ulcer   . Gastroparesis   . GERD (gastroesophageal reflux disease)    PUD with UGI bleed in 8/12 dx by EGD  . Hyperlipemia   . Hypertension   . Hypothyroidism   . Morbid obesity (HCC)   . Osteoarthritis   . Peripheral vascular disease (HCC)   . Sleep apnea    CPAP, pt does not know settings    Past Surgical History:  Procedure Laterality Date  . ABDOMINAL HYSTERECTOMY  1970's  . CATARACT EXTRACTION W/PHACO Left 02/07/2016   Procedure: CATARACT EXTRACTION PHACO AND INTRAOCULAR LENS PLACEMENT LEFT EYE;  Surgeon: Fabio PierceJames Wrzosek, MD;  Location: [facility];  Service: Ophthalmology;  Laterality: Left;  CDE: 4.12  . CATARACT EXTRACTION W/PHACO Right 02/24/2016   Procedure: CATARACT EXTRACTION PHACO AND INTRAOCULAR LENS PLACEMENT (IOC) CDE 4.37;  Surgeon: Fabio PierceJames Wrzosek, MD;  Location: [facility];  Service: Ophthalmology;  Laterality: Right;  right  . COLON RESECTION  1990's  . COLON SURGERY     COLONECTOMY WEARS A BAG  .  DILATION AND CURETTAGE OF UTERUS     "before hysterectomy"  . ESOPHAGOGASTRODUODENOSCOPY N/A 01/18/2016   Procedure: ESOPHAGOGASTRODUODENOSCOPY (EGD);  Surgeon: Hilarie FredricksonJohn N Perry, MD;  [facility];  Service: Endoscopy;  Laterality: N/A;  . GASTROSTOMY W/ FEEDING TUBE  1990's   later removed  . HAND SURGERY     cyst on left hand  . LEFT AND RIGHT HEART CATHETERIZATION WITH CORONARY ANGIOGRAM N/A 04/28/2011   Procedure: LEFT AND RIGHT HEART CATHETERIZATION WITH CORONARY ANGIOGRAM;  Surgeon: Tonny BollmanMichael Cooper, MD;  [facility];  Service: Cardiovascular;  Laterality: N/A;  . NISSEN FUNDOPLICATION     questionable - pt unsure of surgical name  . UPPER GASTROINTESTINAL ENDOSCOPY     Family History  Problem Relation Age of Onset  . Heart disease Mother        MI  . Heart  disease Maternal Grandmother   . Heart disease Paternal Grandmother   . Ovarian cancer Sister   . Diabetes Sister        x3  . Heart disease Brother   . Esophageal cancer Father   . Colon cancer Neg Hx   . Stomach cancer Neg Hx    Social History   Socioeconomic History  . Marital status: Single    Spouse name: Not on file  . Number of children: 3  . Years of education: Not on file  . Highest education level: Not on file  Occupational History  . Occupation: disability  Social Needs  . Financial resource strain: Not on file  . Food insecurity    Worry: Not on file    Inability: Not on file  . Transportation needs    Medical: Not on file    Non-medical: Not on file  Tobacco Use  . Smoking status: Never Smoker  . Smokeless tobacco: Never Used  Substance and Sexual Activity  . Alcohol use: No  . Drug use: No  . Sexual activity: Never  Lifestyle  . Physical activity    Days per week: Not on file    Minutes per session: Not on file  . Stress: Not on file  Relationships  . Social Musicianconnections    Talks on phone: Not on file    Gets together: Not on file    Attends religious service: Not on file    Active member of club or organization: Not on file    Attends meetings of clubs or organizations: Not on file    Relationship status: Not on file  . Intimate partner violence    Fear of current or ex partner: Not on file    Emotionally abused: Not on file    Physically abused: Not on file    Forced sexual activity: Not on file  Other Topics Concern  . Not on file  Social History Narrative  . Not on file     Current Outpatient Medications  Medication Sig Dispense Refill  . amLODipine (NORVASC) 2.5 MG tablet Take 2.5 mg by mouth daily.    Marland Kitchen. aspirin EC 81 MG tablet Take 81 mg by mouth daily.     Marland Kitchen. escitalopram (LEXAPRO) 20 MG tablet Take 20 mg by mouth daily.   2  . Ferrous Sulfate Dried (FEOSOL) 200 (65 FE) MG TABS Take 2 tablets by mouth 3 (three) times a week.     .  fluticasone (FLONASE) 50 MCG/ACT nasal spray Place 2 sprays into both nostrils daily. 1 g 2  . furosemide (LASIX) 40 MG tablet Take  40 mg by mouth daily. For fluid retention    . gabapentin (NEURONTIN) 300 MG capsule Take 300 mg by mouth 3 (three) times daily.     . insulin lispro (HUMALOG) 100 UNIT/ML injection Inject 12-14 Units into the skin 3 (three) times daily before meals. 14 units at breakfast, 12 units at lunch, 14 units at dinner    . levothyroxine (SYNTHROID, LEVOTHROID) 150 MCG tablet Take 150 mcg by mouth daily.      Marland Kitchen loratadine (CLARITIN) 10 MG tablet Take 10 mg by mouth daily.    . Menthol-Methyl Salicylate (MUSCLE RUB) 10-15 % CREA Apply 1 application topically as needed (for pain in hands).    . Multiple Vitamin (MULTIVITAMIN WITH MINERALS) TABS tablet Take 1 tablet by mouth daily.    . nitroGLYCERIN (NITROSTAT) 0.4 MG SL tablet Place 1 tablet (0.4 mg total) under the tongue every 5 (five) minutes as needed for chest pain. 25 tablet prn  . pantoprazole (PROTONIX) 40 MG tablet Take 40 mg by mouth 2 (two) times daily.     . potassium chloride SA (K-DUR,KLOR-CON) 20 MEQ tablet Take 20 mEq by mouth daily.   2  . pravastatin (PRAVACHOL) 80 MG tablet Take 80 mg by mouth at bedtime.    . prednisoLONE acetate (PRED FORTE) 1 % ophthalmic suspension Place 1 drop into the left eye 3 (three) times daily.   0  . TOUJEO SOLOSTAR 300 UNIT/ML SOPN Inject 40 Units as directed at bedtime.   2  . acetaminophen (TYLENOL) 500 MG tablet Take 1,000 mg by mouth every 6 (six) hours as needed for mild pain.     Marland Kitchen albuterol (PROAIR HFA) 108 (90 BASE) MCG/ACT inhaler Inhale 2 puffs into the lungs every 6 (six) hours as needed for wheezing. 1 Inhaler 5  . Insulin Pen Needle 32G X 6 MM MISC 32 g by Does not apply route.     Current Facility-Administered Medications  Medication Dose Route Frequency Provider Last Rate Last Dose  . 0.9 %  sodium chloride infusion  500 mL Intravenous Continuous Hilarie Fredrickson, MD         Allergies:   Patient has no known allergies.    ROS:  Please see the history of present illness.   Otherwise, review of systems are positive for none.   All other systems are reviewed and negative.    PHYSICAL EXAM: VS:  BP 100/64   Pulse 64   Ht 5\' 7"  (1.702 m)   Wt 266 lb (120.7 kg)   BMI 41.66 kg/m  , BMI Body mass index is 41.66 kg/m. GENERAL:  Well appearing NECK:  No jugular venous distention, waveform within normal limits, carotid upstroke brisk and symmetric, no bruits, no thyromegaly LUNGS:  Clear to auscultation bilaterally CHEST:  Unremarkable HEART:  PMI not displaced or sustained,S1 and S2 within normal limits, no S3, no S4, no clicks, no rubs, no murmurs ABD:  Flat, positive bowel sounds normal in frequency in pitch, no bruits, no rebound, no guarding, no midline pulsatile mass, no hepatomegaly, no splenomegaly, positive ostomy bag left lower quadrant EXT:  2 plus pulses throughout, no edema, no cyanosis no clubbing   EKG:  EKG is   ordered today. The ekg ordered today demonstrates sinus rhythm, rate 64, axis within normal limits, intervals within normal limits, no acute ST-T wave changes.   Recent Labs: No results found for requested labs within last 8760 hours.     Wt Readings from Last 3 Encounters:  01/15/19 266 lb (120.7 kg)  11/14/17 264 lb (119.7 kg)  06/25/17 266 lb (120.7 kg)      Other studies Reviewed: Additional studies/ records that were reviewed today include: Labs Review of the above records demonstrates:  Please see elsewhere in the note.     ASSESSMENT AND PLAN:  CAD: Given the chest discomfort and ongoing risk factors she needs to be screened with a stress test but she would not be able to walk.  Therefore, she will have a [employer].  CKD:    She is due to get labs soon and I will ask Monico Blitz, MD to send this to me.   HYPERLIPIDEMIA: LDL was 114.   We discussed the fact that this was not at target.  She is getting  this checked this week and I would like to review as her goal LDL should be less than 70.    Current medicines are reviewed at length with the patient today.  The patient does not have concerns regarding medicines.  The following changes have been made:  None  Labs/ tests ordered today include:   Orders Placed This Encounter  Procedures  . NM Myocar Multi W/Spect W/Wall Motion / EF  . EKG 12-Lead     Disposition:   FU with me in one year or sooner based on the results of the above. Ronnell Guadalajara, MD  01/15/2019 11:53 AM    [place]

## 2019-01-15 ENCOUNTER — Encounter: Payer: Self-pay | Admitting: Cardiology

## 2019-01-15 ENCOUNTER — Ambulatory Visit: Payer: Medicare Other | Admitting: Cardiology

## 2019-01-15 ENCOUNTER — Other Ambulatory Visit: Payer: Self-pay

## 2019-01-15 VITALS — BP 100/64 | HR 64 | Ht 67.0 in | Wt 266.0 lb

## 2019-01-15 DIAGNOSIS — N183 Chronic kidney disease, stage 3 unspecified: Secondary | ICD-10-CM

## 2019-01-15 DIAGNOSIS — R079 Chest pain, unspecified: Secondary | ICD-10-CM

## 2019-01-15 DIAGNOSIS — R0789 Other chest pain: Secondary | ICD-10-CM

## 2019-01-15 DIAGNOSIS — I2510 Atherosclerotic heart disease of native coronary artery without angina pectoris: Secondary | ICD-10-CM | POA: Diagnosis not present

## 2019-01-15 DIAGNOSIS — E785 Hyperlipidemia, unspecified: Secondary | ICD-10-CM

## 2019-01-15 MED ORDER — NITROGLYCERIN 0.4 MG SL SUBL: 0.4000 mg | SUBLINGUAL_TABLET | SUBLINGUAL | 99 refills | Status: DC | PRN

## 2019-01-15 NOTE — Patient Instructions (Addendum)
Medication Instructions:  The current medical regimen is effective;  continue present plan and medications.  If you need a refill on your cardiac medications before your next appointment, please call your pharmacy.   Testing/Procedures: Your physician has requested that you have a lexiscan myoview.  Register at 8:30 at Radiology - [facility].  Nothing to eat/drink after midnight the night before.  You may take your medication that morning with sips of water.  Take only 1/2 your normal dose of Insulin.  You may hold your Furosemide this AM. No caffeine 12 hours before.  Follow-Up: Follow up in 1 year with Dr. Percival Spanish.  You will receive a letter in the mail 2 months before you are due.  Please call us when you receive this letter to schedule your follow up appointment.  Thank you for choosing [facility]!!

## 2019-01-22 ENCOUNTER — Encounter (HOSPITAL_COMMUNITY): Payer: Self-pay

## 2019-01-22 ENCOUNTER — Encounter (HOSPITAL_BASED_OUTPATIENT_CLINIC_OR_DEPARTMENT_OTHER)
Admission: RE | Admit: 2019-01-22 | Discharge: 2019-01-22 | Disposition: A | Discharge status: Home / Self Care | Payer: Medicare Other | Source: Ambulatory Visit | Attending: Cardiology | Admitting: Cardiology

## 2019-01-22 ENCOUNTER — Encounter (HOSPITAL_COMMUNITY)
Admission: RE | Admit: 2019-01-22 | Discharge: 2019-01-22 | Disposition: A | Discharge status: Home / Self Care | Payer: Medicare Other | Source: Ambulatory Visit | Attending: Cardiology | Admitting: Cardiology

## 2019-01-22 ENCOUNTER — Other Ambulatory Visit: Payer: Self-pay

## 2019-01-22 DIAGNOSIS — R079 Chest pain, unspecified: Secondary | ICD-10-CM

## 2019-01-22 DIAGNOSIS — I2510 Atherosclerotic heart disease of native coronary artery without angina pectoris: Secondary | ICD-10-CM

## 2019-01-22 DIAGNOSIS — R0789 Other chest pain: Secondary | ICD-10-CM

## 2019-01-22 LAB — NM MYOCAR MULTI W/SPECT W/WALL MOTION / EF
LV dias vol: 86 mL (ref 46–106)
LV sys vol: 27 mL
Peak HR: 80 {beats}/min
RATE: 0.37
Rest HR: 51 {beats}/min
SDS: 3
SRS: 1
SSS: 4
TID: 1.13

## 2019-01-22 MED ORDER — SODIUM CHLORIDE 0.9% FLUSH
INTRAVENOUS | Status: AC
  Administered 2019-01-22: 10:00:00 10 mL via INTRAVENOUS
  Filled 2019-01-22: qty 10

## 2019-01-22 MED ORDER — REGADENOSON 0.4 MG/5ML IV SOLN
INTRAVENOUS | Status: AC
  Administered 2019-01-22: 0.4 mg via INTRAVENOUS
  Filled 2019-01-22: qty 5

## 2019-01-22 MED ORDER — TECHNETIUM TC 99M TETROFOSMIN IV KIT
10.0000 | PACK | Freq: Once | INTRAVENOUS | Status: AC | PRN
  Administered 2019-01-22: 09:00:00 11 via INTRAVENOUS

## 2019-01-22 MED ORDER — AMINOPHYLLINE 25 MG/ML IV SOLN
INTRAVENOUS | Status: AC
  Administered 2019-01-22: 25 mg via INTRAVENOUS
  Filled 2019-01-22: qty 10

## 2019-01-22 MED ORDER — TECHNETIUM TC 99M TETROFOSMIN IV KIT
30.0000 | PACK | Freq: Once | INTRAVENOUS | Status: AC | PRN
  Administered 2019-01-22: 10:00:00 32 via INTRAVENOUS

## 2020-05-04 NOTE — Progress Notes (Signed)
Cardiology Office Note   Date:  05/05/2020   ID:  Jenna, Diaz 1945-12-31, MRN [id number]  PCP:  Monico Blitz, MD  Cardiologist:   No primary care provider on file.   Chief Complaint  Patient presents with  . Coronary Artery Disease      History of Present Illness: Jenna Diaz is a 75 y.o. female who presents for follow up of CAD.  Cardiac cath in 2012 demonstrated a 60-70% mid LAD stenosis which was significant by FFR. The stenosis in the proximal RCA was not significant. She underwent an angioplasty and drug-eluting stent placement to the LAD without complications. In 2015 she had stress perfusion imaging which was negative for any evidence of ischemia with a well preserved ejection fraction.  This was done to evaluate increased DOE.   There was another Lexicographer at [facility] after this.   This was negative for ischemia.   This was in 2018.  She had some chest discomfort in 2020 and again a negative perfusion study.  Since I last saw her she has done well.  The patient denies any new symptoms such as chest discomfort, neck or arm discomfort. There has been no new shortness of breath, PND or orthopnea. There have been no reported palpitations, presyncope or syncope.  She is limited by knee pain and wants to have knee surgery if she can lose weight and she actually has lost some   Past Medical History:  Diagnosis Date  . Anemia   . Anxiety   . Bowel obstruction ([place] ) 1997  . Bursitis   . CAD (coronary artery disease)    A. 11/2010 - Cath: 60% LAD, 50% RCA ;  B. s/p Promus DES to LAD (by FFR 12/12);  RCA ok by FFR 12/12  . Carpal tunnel syndrome   . Cholelithiasis   . Chronic kidney disease    questionable  . Diabetes mellitus   . Esophageal stricture   . Essential hypertension   . Ganglion cyst   . Gastric ulcer   . Gastroparesis   . GERD (gastroesophageal reflux disease)    PUD with UGI bleed in 8/12 dx by EGD  . Hyperlipemia   .  Hypertension   . Hypothyroidism   . Morbid obesity ([place])   . Osteoarthritis   . Peripheral vascular disease ([place])   . Sleep apnea    CPAP, pt does not know settings    Past Surgical History:  Procedure Laterality Date  . ABDOMINAL HYSTERECTOMY  1970's  . CATARACT EXTRACTION W/PHACO Left 02/07/2016   Procedure: CATARACT EXTRACTION PHACO AND INTRAOCULAR LENS PLACEMENT LEFT EYE;  Surgeon: Baruch Goldmann, MD;  Location: [facility];  Service: Ophthalmology;  Laterality: Left;  CDE: 4.12  . CATARACT EXTRACTION W/PHACO Right 02/24/2016   Procedure: CATARACT EXTRACTION PHACO AND INTRAOCULAR LENS PLACEMENT (IOC) CDE 4.37;  Surgeon: Baruch Goldmann, MD;  Location: [facility];  Service: Ophthalmology;  Laterality: Right;  right  . COLON RESECTION  1990's  . COLON SURGERY     COLONECTOMY WEARS A BAG  . DILATION AND CURETTAGE OF UTERUS     "before hysterectomy"  . ESOPHAGOGASTRODUODENOSCOPY N/A 01/18/2016   Procedure: ESOPHAGOGASTRODUODENOSCOPY (EGD);  Surgeon: Irene Shipper, MD;  Location: [facility];  Service: Endoscopy;  Laterality: N/A;  . GASTROSTOMY W/ FEEDING TUBE  1990's   later removed  . HAND SURGERY     cyst on left hand  . LEFT AND RIGHT HEART CATHETERIZATION WITH CORONARY ANGIOGRAM N/A  04/28/2011   Procedure: LEFT AND RIGHT HEART CATHETERIZATION WITH CORONARY ANGIOGRAM;  Surgeon: Tonny Bollman, MD;  Location: [facility] CATH LAB;  Service: Cardiovascular;  Laterality: N/A;  . NISSEN FUNDOPLICATION     questionable - pt unsure of surgical name  . UPPER GASTROINTESTINAL ENDOSCOPY     Family History  Problem Relation Age of Onset  . Heart disease Mother        MI  . Heart disease Maternal Grandmother   . Heart disease Paternal Grandmother   . Ovarian cancer Sister   . Diabetes Sister        x3  . Heart disease Brother   . Esophageal cancer Father   . Colon cancer Neg Hx   . Stomach cancer Neg Hx    Social History   Socioeconomic History  . Marital status: Single    Spouse name: Not on  file  . Number of children: 3  . Years of education: Not on file  . Highest education level: Not on file  Occupational History  . Occupation: disability  Tobacco Use  . Smoking status: Never Smoker  . Smokeless tobacco: Never Used  Vaping Use  . Vaping Use: Never used  Substance and Sexual Activity  . Alcohol use: No  . Drug use: No  . Sexual activity: Never  Other Topics Concern  . Not on file  Social History Narrative  . Not on file   Social Determinants of Health   Financial Resource Strain: Not on file  Food Insecurity: Not on file  Transportation Needs: Not on file  Physical Activity: Not on file  Stress: Not on file  Social Connections: Not on file  Intimate Partner Violence: Not on file     Current Outpatient Medications  Medication Sig Dispense Refill  . acetaminophen (TYLENOL) 500 MG tablet Take 1,000 mg by mouth every 6 (six) hours as needed for mild pain.     Marland Kitchen amLODipine (NORVASC) 2.5 MG tablet Take 2.5 mg by mouth daily.    Marland Kitchen aspirin EC 81 MG tablet Take 81 mg by mouth daily.     Marland Kitchen escitalopram (LEXAPRO) 20 MG tablet Take 20 mg by mouth daily.   2  . Ferrous Sulfate (FERROUSUL PO) Take 34 mg by mouth in the morning, at noon, in the evening, and at bedtime.    . fluticasone (FLONASE) 50 MCG/ACT nasal spray Place 2 sprays into both nostrils daily. 1 g 2  . furosemide (LASIX) 40 MG tablet Take 40 mg by mouth daily. For fluid retention    . gabapentin (NEURONTIN) 300 MG capsule Take 300 mg by mouth 3 (three) times daily.     . insulin lispro (HUMALOG) 100 UNIT/ML injection Inject 12-14 Units into the skin 3 (three) times daily before meals. 14 units at breakfast, 12 units at lunch, 14 units at dinner    . levothyroxine (SYNTHROID, LEVOTHROID) 150 MCG tablet Take 150 mcg by mouth daily.      Marland Kitchen loratadine (CLARITIN) 10 MG tablet Take 10 mg by mouth daily.    Marland Kitchen losartan (COZAAR) 50 MG tablet Take 50 mg by mouth daily.    . Menthol-Methyl Salicylate (MUSCLE RUB)  10-15 % CREA Apply 1 application topically as needed (for pain in hands).    . nitroGLYCERIN (NITROSTAT) 0.4 MG SL tablet Place 1 tablet (0.4 mg total) under the tongue every 5 (five) minutes as needed for chest pain. 25 tablet prn  . pantoprazole (PROTONIX) 40 MG tablet Take 40 mg by  mouth 2 (two) times daily.     . potassium chloride SA (K-DUR,KLOR-CON) 20 MEQ tablet Take 20 mEq by mouth daily.   2  . pravastatin (PRAVACHOL) 80 MG tablet Take 80 mg by mouth at bedtime.    Nathen May SOLOSTAR 300 UNIT/ML SOPN Inject 60 Units as directed at bedtime.  2  . albuterol (PROAIR HFA) 108 (90 BASE) MCG/ACT inhaler Inhale 2 puffs into the lungs every 6 (six) hours as needed for wheezing. 1 Inhaler 5  . Insulin Pen Needle 32G X 6 MM MISC 32 g by Does not apply route.    . Multiple Vitamin (MULTIVITAMIN WITH MINERALS) TABS tablet Take 1 tablet by mouth daily. (Patient not taking: Reported on 05/05/2020)    . prednisoLONE acetate (PRED FORTE) 1 % ophthalmic suspension Place 1 drop into the left eye 3 (three) times daily.  (Patient not taking: Reported on 05/05/2020)  0   Current Facility-Administered Medications  Medication Dose Route Frequency Provider Last Rate Last Admin  . 0.9 %  sodium chloride infusion  500 mL Intravenous Continuous Hilarie Fredrickson, MD        Allergies:   Patient has no known allergies.    ROS:  Please see the history of present illness.   Otherwise, review of systems are positive for none.   All other systems are reviewed and negative.    PHYSICAL EXAM: VS:  BP 100/60   Pulse 69   Ht 5\' 7"  (1.702 m)   Wt 259 lb (117.5 kg)   BMI 40.57 kg/m  , BMI Body mass index is 40.57 kg/m. GENERAL:  Well appearing NECK:  No jugular venous distention, waveform within normal limits, carotid upstroke brisk and symmetric, no bruits, no thyromegaly LUNGS:  Clear to auscultation bilaterally CHEST:  Unremarkable HEART:  PMI not displaced or sustained,S1 and S2 within normal limits, no S3, no S4, no  clicks, no rubs, no murmurs ABD:  Flat, positive bowel sounds normal in frequency in pitch, no bruits, no rebound, no guarding, no midline pulsatile mass, no hepatomegaly, no splenomegaly,  EXT:  2 plus pulses throughout, no edema, no cyanosis no clubbing    EKG:  EKG is  ordered today. The ekg ordered today demonstrates sinus rhythm, rate 69, axis within normal limits, intervals within normal limits, no acute ST-T wave changes.   Recent Labs: No results found for requested labs within last 8760 hours.     Wt Readings from Last 3 Encounters:  05/05/20 259 lb (117.5 kg)  01/15/19 266 lb (120.7 kg)  11/14/17 264 lb (119.7 kg)      Other studies Reviewed: Additional studies/ records that were reviewed today include: None Review of the above records demonstrates:   ASSESSMENT AND PLAN:  CAD:  The patient has no new sypmtoms.  No further cardiovascular testing is indicated.  We will continue with aggressive risk reduction and meds as listed.  CKD:    I do not have the most recent labs.  I will defer to 11/16/17, MD  HYPERLIPIDEMIA:   I will defer to Kirstie Peri, MD with a goal LDL less than 70.    Current medicines are reviewed at length with the patient today.  The patient does not have concerns regarding medicines.  The following changes have been made:  None  Labs/ tests ordered today include:   Orders Placed This Encounter  Procedures  . EKG 12-Lead     Disposition:   FU with me in one year.  Signed, Rollene Rotunda, MD  05/05/2020 2:08 PM    [facility]

## 2020-05-05 ENCOUNTER — Encounter: Payer: Self-pay | Admitting: Cardiology

## 2020-05-05 ENCOUNTER — Ambulatory Visit: Payer: Medicare Other | Admitting: Cardiology

## 2020-05-05 ENCOUNTER — Other Ambulatory Visit: Payer: Self-pay

## 2020-05-05 VITALS — BP 100/60 | HR 69 | Ht 67.0 in | Wt 259.0 lb

## 2020-05-05 DIAGNOSIS — N1831 Chronic kidney disease, stage 3a: Secondary | ICD-10-CM

## 2020-05-05 DIAGNOSIS — E785 Hyperlipidemia, unspecified: Secondary | ICD-10-CM

## 2020-05-05 DIAGNOSIS — I2510 Atherosclerotic heart disease of native coronary artery without angina pectoris: Secondary | ICD-10-CM | POA: Diagnosis not present

## 2020-05-05 NOTE — Patient Instructions (Signed)
Medication Instructions:  The current medical regimen is effective;  continue present plan and medications.  *If you need a refill on your cardiac medications before your next appointment, please call your pharmacy*  Follow-Up: At [facility], you and your health needs are our priority.  As part of our continuing mission to provide you with exceptional heart care, we have created designated Provider Care Teams.  These Care Teams include your primary Cardiologist (physician) and Advanced Practice Providers (APPs -  Physician Assistants and Nurse Practitioners) who all work together to provide you with the care you need, when you need it.  We recommend signing up for the patient portal called "MyChart".  Sign up information is provided on this After Visit Summary.  MyChart is used to connect with patients for Virtual Visits (Telemedicine).  Patients are able to view lab/test results, encounter notes, upcoming appointments, etc.  Non-urgent messages can be sent to your provider as well.   To learn more about what you can do with MyChart, go to [email or web address].    Your next appointment:   12 month(s)  The format for your next appointment:   In Person  Provider:   James Hochrein, MD   Thank you for choosing [facility]!!     

## 2021-04-04 NOTE — Progress Notes (Deleted)
Cardiology Office Note   Date:  04/04/2021   ID:  [name], [name] Oct 09, 1945, MRN [id number]  PCP:  Kirstie Peri, MD  Cardiologist:   None   No chief complaint on file.     History of Present Illness: Jenna Diaz is a 75 y.o. female who presents for follow up of CAD.  Cardiac cath in 2012 demonstrated a 60-70% mid LAD stenosis which was significant by FFR. The stenosis in the proximal RCA was not significant. She underwent an angioplasty and drug-eluting stent placement to the LAD without complications. In 2015 she had stress perfusion imaging which was negative for any evidence of ischemia with a well preserved ejection fraction.  This was done to evaluate increased DOE.   There was another Scientist, physiological at [facility] after this.   This was negative for ischemia.   This was in 2018.  She had some chest discomfort in 2020 and again a negative perfusion study.  Since I last saw her ***   *** she has done well.  The patient denies any new symptoms such as chest discomfort, neck or arm discomfort. There has been no new shortness of breath, PND or orthopnea. There have been no reported palpitations, presyncope or syncope.  She is limited by knee pain and wants to have knee surgery if she can lose weight and she actually has lost some   Past Medical History:  Diagnosis Date   Anemia    Anxiety    Bowel obstruction ([facility]) 1997   Bursitis    CAD (coronary artery disease)    A. 11/2010 - Cath: 60% LAD, 50% RCA ;  B. s/p Promus DES to LAD (by FFR 12/12);  RCA ok by FFR 12/12   Carpal tunnel syndrome    Cholelithiasis    Chronic kidney disease    questionable   Diabetes mellitus    Esophageal stricture    Essential hypertension    Ganglion cyst    Gastric ulcer    Gastroparesis    GERD (gastroesophageal reflux disease)    PUD with UGI bleed in 8/12 dx by EGD   Hyperlipemia    Hypertension    Hypothyroidism    Morbid obesity ([facility])    Osteoarthritis     Peripheral vascular disease ([facility])    Sleep apnea    CPAP, pt does not know settings    Past Surgical History:  Procedure Laterality Date   ABDOMINAL HYSTERECTOMY  1970's   CATARACT EXTRACTION W/PHACO Left 02/07/2016   Procedure: CATARACT EXTRACTION PHACO AND INTRAOCULAR LENS PLACEMENT LEFT EYE;  Surgeon: Fabio Pierce, MD;  Location: [facility];  Service: Ophthalmology;  Laterality: Left;  CDE: 4.12   CATARACT EXTRACTION W/PHACO Right 02/24/2016   Procedure: CATARACT EXTRACTION PHACO AND INTRAOCULAR LENS PLACEMENT (IOC) CDE 4.37;  Surgeon: Fabio Pierce, MD;  Location: [facility];  Service: Ophthalmology;  Laterality: Right;  right   COLON RESECTION  1990's   COLON SURGERY     COLONECTOMY WEARS A BAG   DILATION AND CURETTAGE OF UTERUS     "before hysterectomy"   ESOPHAGOGASTRODUODENOSCOPY N/A 01/18/2016   Procedure: ESOPHAGOGASTRODUODENOSCOPY (EGD);  Surgeon: Hilarie Fredrickson, MD;  Location: [facility];  Service: Endoscopy;  Laterality: N/A;   GASTROSTOMY W/ FEEDING TUBE  1990's   later removed   HAND SURGERY     cyst on left hand   LEFT AND RIGHT HEART CATHETERIZATION WITH CORONARY ANGIOGRAM N/A 04/28/2011   Procedure: LEFT AND RIGHT HEART  CATHETERIZATION WITH CORONARY ANGIOGRAM;  Surgeon: Tonny Bollman, MD;  Location: [facility] CATH LAB;  Service: Cardiovascular;  Laterality: N/A;   NISSEN FUNDOPLICATION     questionable - pt unsure of surgical name   UPPER GASTROINTESTINAL ENDOSCOPY     Family History  Problem Relation Age of Onset   Heart disease Mother        MI   Heart disease Maternal Grandmother    Heart disease Paternal Grandmother    Ovarian cancer Sister    Diabetes Sister        x3   Heart disease Brother    Esophageal cancer Father    Colon cancer Neg Hx    Stomach cancer Neg Hx    Social History   Socioeconomic History   Marital status: Single    Spouse name: Not on file   Number of children: 3   Years of education: Not on file   Highest education level: Not on file   Occupational History   Occupation: disability  Tobacco Use   Smoking status: Never   Smokeless tobacco: Never  Vaping Use   Vaping Use: Never used  Substance and Sexual Activity   Alcohol use: No   Drug use: No   Sexual activity: Never  Other Topics Concern   Not on file  Social History Narrative   Not on file   Social Determinants of Health   Financial Resource Strain: Not on file  Food Insecurity: Not on file  Transportation Needs: Not on file  Physical Activity: Not on file  Stress: Not on file  Social Connections: Not on file  Intimate Partner Violence: Not on file     Current Outpatient Medications  Medication Sig Dispense Refill   acetaminophen (TYLENOL) 500 MG tablet Take 1,000 mg by mouth every 6 (six) hours as needed for mild pain.      albuterol (PROAIR HFA) 108 (90 BASE) MCG/ACT inhaler Inhale 2 puffs into the lungs every 6 (six) hours as needed for wheezing. 1 Inhaler 5   amLODipine (NORVASC) 2.5 MG tablet Take 2.5 mg by mouth daily.     aspirin EC 81 MG tablet Take 81 mg by mouth daily.      escitalopram (LEXAPRO) 20 MG tablet Take 20 mg by mouth daily.   2   Ferrous Sulfate (FERROUSUL PO) Take 34 mg by mouth in the morning, at noon, in the evening, and at bedtime.     fluticasone (FLONASE) 50 MCG/ACT nasal spray Place 2 sprays into both nostrils daily. 1 g 2   furosemide (LASIX) 40 MG tablet Take 40 mg by mouth daily. For fluid retention     gabapentin (NEURONTIN) 300 MG capsule Take 300 mg by mouth 3 (three) times daily.      insulin lispro (HUMALOG) 100 UNIT/ML injection Inject 12-14 Units into the skin 3 (three) times daily before meals. 14 units at breakfast, 12 units at lunch, 14 units at dinner     Insulin Pen Needle 32G X 6 MM MISC 32 g by Does not apply route.     levothyroxine (SYNTHROID, LEVOTHROID) 150 MCG tablet Take 150 mcg by mouth daily.       loratadine (CLARITIN) 10 MG tablet Take 10 mg by mouth daily.     losartan (COZAAR) 50 MG tablet Take  50 mg by mouth daily.     Menthol-Methyl Salicylate (MUSCLE RUB) 10-15 % CREA Apply 1 application topically as needed (for pain in hands).     Multiple Vitamin (  MULTIVITAMIN WITH MINERALS) TABS tablet Take 1 tablet by mouth daily. (Patient not taking: Reported on 05/05/2020)     nitroGLYCERIN (NITROSTAT) 0.4 MG SL tablet Place 1 tablet (0.4 mg total) under the tongue every 5 (five) minutes as needed for chest pain. 25 tablet prn   pantoprazole (PROTONIX) 40 MG tablet Take 40 mg by mouth 2 (two) times daily.      potassium chloride SA (K-DUR,KLOR-CON) 20 MEQ tablet Take 20 mEq by mouth daily.   2   pravastatin (PRAVACHOL) 80 MG tablet Take 80 mg by mouth at bedtime.     prednisoLONE acetate (PRED FORTE) 1 % ophthalmic suspension Place 1 drop into the left eye 3 (three) times daily.  (Patient not taking: Reported on 05/05/2020)  0   TOUJEO SOLOSTAR 300 UNIT/ML SOPN Inject 60 Units as directed at bedtime.  2   Current Facility-Administered Medications  Medication Dose Route Frequency Provider Last Rate Last Admin   0.9 %  sodium chloride infusion  500 mL Intravenous Continuous Hilarie Fredrickson, MD        Allergies:   Patient has no known allergies.    ROS:  Please see the history of present illness.   Otherwise, review of systems are positive for ***.   All other systems are reviewed and negative.    PHYSICAL EXAM: VS:  There were no vitals taken for this visit. , BMI There is no height or weight on file to calculate BMI. GENERAL:  Well appearing NECK:  No jugular venous distention, waveform within normal limits, carotid upstroke brisk and symmetric, no bruits, no thyromegaly LUNGS:  Clear to auscultation bilaterally CHEST:  Unremarkable HEART:  PMI not displaced or sustained,S1 and S2 within normal limits, no S3, no S4, no clicks, no rubs, *** murmurs ABD:  Flat, positive bowel sounds normal in frequency in pitch, no bruits, no rebound, no guarding, no midline pulsatile mass, no hepatomegaly, no  splenomegaly EXT:  2 plus pulses throughout, no edema, no cyanosis no clubbing     ***GENERAL:  Well appearing NECK:  No jugular venous distention, waveform within normal limits, carotid upstroke brisk and symmetric, no bruits, no thyromegaly LUNGS:  Clear to auscultation bilaterally CHEST:  Unremarkable HEART:  PMI not displaced or sustained,S1 and S2 within normal limits, no S3, no S4, no clicks, no rubs, no murmurs ABD:  Flat, positive bowel sounds normal in frequency in pitch, no bruits, no rebound, no guarding, no midline pulsatile mass, no hepatomegaly, no splenomegaly,  EXT:  2 plus pulses throughout, no edema, no cyanosis no clubbing    EKG:  EKG is *** ordered today. The ekg ordered today demonstrates sinus rhythm, rate ***, axis within normal limits, intervals within normal limits, no acute ST-T wave changes.   Recent Labs: No results found for requested labs within last 8760 hours.     Wt Readings from Last 3 Encounters:  05/05/20 259 lb (117.5 kg)  01/15/19 266 lb (120.7 kg)  11/14/17 264 lb (119.7 kg)      Other studies Reviewed: Additional studies/ records that were reviewed today include: *** Review of the above records demonstrates:  ***  ASSESSMENT AND PLAN:  CAD:  ***  The patient has no new sypmtoms.  No further cardiovascular testing is indicated.  We will continue with aggressive risk reduction and meds as listed.  CKD:    ***  I do not have the most recent labs.  I will defer to Kirstie Peri, MD  HYPERLIPIDEMIA:  I will defer to Kirstie Peri, MD with a goal LDL less than 70.    ***   Current medicines are reviewed at length with the patient today.  The patient does not have concerns regarding medicines.  The following changes have been made:  ***  Labs/ tests ordered today include:  ***  No orders of the defined types were placed in this encounter.    Disposition:   FU with me in ***   Signed, Rollene Rotunda, MD  04/04/2021 8:51 AM     [facility]

## 2021-04-06 ENCOUNTER — Ambulatory Visit: Payer: Medicare Other | Admitting: Cardiology

## 2021-04-06 DIAGNOSIS — I2510 Atherosclerotic heart disease of native coronary artery without angina pectoris: Secondary | ICD-10-CM

## 2021-04-06 DIAGNOSIS — E785 Hyperlipidemia, unspecified: Secondary | ICD-10-CM

## 2021-04-06 DIAGNOSIS — N1831 Chronic kidney disease, stage 3a: Secondary | ICD-10-CM

## 2021-05-16 NOTE — Progress Notes (Deleted)
Cardiology Office Note   Date:  05/16/2021   ID:  Jenna, Diaz 1945/09/24, MRN [id number]  PCP:  Kirstie Peri, MD  Cardiologist:   None   No chief complaint on file.     History of Present Illness: Jenna Diaz is a 76 y.o. female who presents for follow up of CAD.  Cardiac cath in 2012 demonstrated a 60-70% mid LAD stenosis which was significant by FFR. The stenosis in the proximal RCA was not significant. She underwent an angioplasty and drug-eluting stent placement to the LAD without complications. In 2015 she had stress perfusion imaging which was negative for any evidence of ischemia with a well preserved ejection fraction.  This was done to evaluate increased DOE.   There was another Scientist, physiological at [facility] after this.   This was negative for ischemia.   This was in 2018.  She had some chest discomfort in 2020 and again a negative perfusion study.  Since I last saw her ***   ***  she has done well.  The patient denies any new symptoms such as chest discomfort, neck or arm discomfort. There has been no new shortness of breath, PND or orthopnea. There have been no reported palpitations, presyncope or syncope.  She is limited by knee pain and wants to have knee surgery if she can lose weight and she actually has lost some   Past Medical History:  Diagnosis Date   Anemia    Anxiety    Bowel obstruction (HCC) 1997   Bursitis    CAD (coronary artery disease)    A. 11/2010 - Cath: 60% LAD, 50% RCA ;  B. s/p Promus DES to LAD (by FFR 12/12);  RCA ok by FFR 12/12   Carpal tunnel syndrome    Cholelithiasis    Chronic kidney disease    questionable   Diabetes mellitus    Esophageal stricture    Essential hypertension    Ganglion cyst    Gastric ulcer    Gastroparesis    GERD (gastroesophageal reflux disease)    PUD with UGI bleed in 8/12 dx by EGD   Hyperlipemia    Hypertension    Hypothyroidism    Morbid obesity (HCC)    Osteoarthritis     Peripheral vascular disease (HCC)    Sleep apnea    CPAP, pt does not know settings    Past Surgical History:  Procedure Laterality Date   ABDOMINAL HYSTERECTOMY  1970's   CATARACT EXTRACTION W/PHACO Left 02/07/2016   Procedure: CATARACT EXTRACTION PHACO AND INTRAOCULAR LENS PLACEMENT LEFT EYE;  Surgeon: Fabio Pierce, MD;  Location: [facility];  Service: Ophthalmology;  Laterality: Left;  CDE: 4.12   CATARACT EXTRACTION W/PHACO Right 02/24/2016   Procedure: CATARACT EXTRACTION PHACO AND INTRAOCULAR LENS PLACEMENT (IOC) CDE 4.37;  Surgeon: Fabio Pierce, MD;  Location: [facility];  Service: Ophthalmology;  Laterality: Right;  right   COLON RESECTION  1990's   COLON SURGERY     COLONECTOMY WEARS A BAG   DILATION AND CURETTAGE OF UTERUS     "before hysterectomy"   ESOPHAGOGASTRODUODENOSCOPY N/A 01/18/2016   Procedure: ESOPHAGOGASTRODUODENOSCOPY (EGD);  Surgeon: Hilarie Fredrickson, MD;  Location: [facility];  Service: Endoscopy;  Laterality: N/A;   GASTROSTOMY W/ FEEDING TUBE  1990's   later removed   HAND SURGERY     cyst on left hand   LEFT AND RIGHT HEART CATHETERIZATION WITH CORONARY ANGIOGRAM N/A 04/28/2011   Procedure: LEFT AND RIGHT  HEART CATHETERIZATION WITH CORONARY ANGIOGRAM;  Surgeon: Tonny Bollman, MD;  Location: [facility] CATH LAB;  Service: Cardiovascular;  Laterality: N/A;   NISSEN FUNDOPLICATION     questionable - pt unsure of surgical name   UPPER GASTROINTESTINAL ENDOSCOPY     Family History  Problem Relation Age of Onset   Heart disease Mother        MI   Heart disease Maternal Grandmother    Heart disease Paternal Grandmother    Ovarian cancer Sister    Diabetes Sister        x3   Heart disease Brother    Esophageal cancer Father    Colon cancer Neg Hx    Stomach cancer Neg Hx    Social History   Socioeconomic History   Marital status: Single    Spouse name: Not on file   Number of children: 3   Years of education: Not on file   Highest education level: Not on file   Occupational History   Occupation: disability  Tobacco Use   Smoking status: Never   Smokeless tobacco: Never  Vaping Use   Vaping Use: Never used  Substance and Sexual Activity   Alcohol use: No   Drug use: No   Sexual activity: Never  Other Topics Concern   Not on file  Social History Narrative   Not on file   Social Determinants of Health   Financial Resource Strain: Not on file  Food Insecurity: Not on file  Transportation Needs: Not on file  Physical Activity: Not on file  Stress: Not on file  Social Connections: Not on file  Intimate Partner Violence: Not on file     Current Outpatient Medications  Medication Sig Dispense Refill   loratadine (CLARITIN) 10 MG tablet Take by mouth.     acetaminophen (TYLENOL) 500 MG tablet Take 1,000 mg by mouth every 6 (six) hours as needed for mild pain.      ADVAIR DISKUS 250-50 MCG/ACT AEPB Inhale 1 puff into the lungs 2 (two) times daily.     albuterol (PROAIR HFA) 108 (90 BASE) MCG/ACT inhaler Inhale 2 puffs into the lungs every 6 (six) hours as needed for wheezing. 1 Inhaler 5   amLODipine (NORVASC) 2.5 MG tablet Take 2.5 mg by mouth daily.     aspirin EC 81 MG tablet Take 81 mg by mouth daily.      escitalopram (LEXAPRO) 20 MG tablet Take 20 mg by mouth daily.   2   Ferrous Sulfate (FERROUSUL PO) Take 34 mg by mouth in the morning, at noon, in the evening, and at bedtime.     fluticasone (FLONASE) 50 MCG/ACT nasal spray Place 2 sprays into both nostrils daily. 1 g 2   furosemide (LASIX) 40 MG tablet Take 40 mg by mouth daily. For fluid retention     gabapentin (NEURONTIN) 300 MG capsule Take 300 mg by mouth 3 (three) times daily.      glimepiride (AMARYL) 2 MG tablet Take 2 mg by mouth in the morning.     insulin lispro (HUMALOG) 100 UNIT/ML injection Inject 12-14 Units into the skin 3 (three) times daily before meals. 14 units at breakfast, 12 units at lunch, 14 units at dinner     Insulin Pen Needle 32G X 6 MM MISC 32 g by  Does not apply route.     levothyroxine (SYNTHROID, LEVOTHROID) 150 MCG tablet Take 150 mcg by mouth daily.       loratadine (CLARITIN) 10 MG  tablet Take 10 mg by mouth daily.     losartan (COZAAR) 50 MG tablet Take 50 mg by mouth daily.     Menthol-Methyl Salicylate (MUSCLE RUB) 10-15 % CREA Apply 1 application topically as needed (for pain in hands).     Multiple Vitamin (MULTIVITAMIN WITH MINERALS) TABS tablet Take 1 tablet by mouth daily. (Patient not taking: Reported on 05/05/2020)     nitroGLYCERIN (NITROSTAT) 0.4 MG SL tablet Place 1 tablet (0.4 mg total) under the tongue every 5 (five) minutes as needed for chest pain. 25 tablet prn   OZEMPIC, 0.25 OR 0.5 MG/DOSE, 2 MG/1.5ML SOPN Inject into the skin. Inject into the skin.     pantoprazole (PROTONIX) 40 MG tablet Take 40 mg by mouth 2 (two) times daily.      potassium chloride SA (K-DUR,KLOR-CON) 20 MEQ tablet Take 20 mEq by mouth daily.   2   pravastatin (PRAVACHOL) 80 MG tablet Take 80 mg by mouth at bedtime.     prednisoLONE acetate (PRED FORTE) 1 % ophthalmic suspension Place 1 drop into the left eye 3 (three) times daily.  (Patient not taking: Reported on 05/05/2020)  0   rosuvastatin (CRESTOR) 10 MG tablet Take 10 mg by mouth daily.     TOUJEO SOLOSTAR 300 UNIT/ML SOPN Inject 60 Units as directed at bedtime.  2   Current Facility-Administered Medications  Medication Dose Route Frequency Provider Last Rate Last Admin   0.9 %  sodium chloride infusion  500 mL Intravenous Continuous Hilarie FredricksonPerry, John N, MD        Allergies:   Patient has no known allergies.    ROS:  Please see the history of present illness.   Otherwise, review of systems are positive for ***.   All other systems are reviewed and negative.    PHYSICAL EXAM: VS:  There were no vitals taken for this visit. , BMI There is no height or weight on file to calculate BMI. GENERAL:  Well appearing NECK:  No jugular venous distention, waveform within normal limits, carotid  upstroke brisk and symmetric, no bruits, no thyromegaly LUNGS:  Clear to auscultation bilaterally CHEST:  Unremarkable HEART:  PMI not displaced or sustained,S1 and S2 within normal limits, no S3, no S4, no clicks, no rubs, *** murmurs ABD:  Flat, positive bowel sounds normal in frequency in pitch, no bruits, no rebound, no guarding, no midline pulsatile mass, no hepatomegaly, no splenomegaly EXT:  2 plus pulses throughout, no edema, no cyanosis no clubbing    ***GENERAL:  Well appearing NECK:  No jugular venous distention, waveform within normal limits, carotid upstroke brisk and symmetric, no bruits, no thyromegaly LUNGS:  Clear to auscultation bilaterally CHEST:  Unremarkable HEART:  PMI not displaced or sustained,S1 and S2 within normal limits, no S3, no S4, no clicks, no rubs, no murmurs ABD:  Flat, positive bowel sounds normal in frequency in pitch, no bruits, no rebound, no guarding, no midline pulsatile mass, no hepatomegaly, no splenomegaly,  EXT:  2 plus pulses throughout, no edema, no cyanosis no clubbing    EKG:  EKG is *** ordered today. The ekg ordered today demonstrates sinus rhythm, rate ***, axis within normal limits, intervals within normal limits, no acute ST-T wave changes.   Recent Labs: No results found for requested labs within last 8760 hours.     Wt Readings from Last 3 Encounters:  05/05/20 259 lb (117.5 kg)  01/15/19 266 lb (120.7 kg)  11/14/17 264 lb (119.7 kg)  Other studies Reviewed: Additional studies/ records that were reviewed today include: *** Review of the above records demonstrates: ***  ASSESSMENT AND PLAN:  CAD:  ***  The patient has no new sypmtoms.  No further cardiovascular testing is indicated.  We will continue with aggressive risk reduction and meds as listed.  CKD:    ***  I do not have the most recent labs.  I will defer to Kirstie PeriShah, Ashish, MD  HYPERLIPIDEMIA:   LDL goal was *** I will defer to Kirstie PeriShah, Ashish, MD with a goal  LDL less than 70.    Current medicines are reviewed at length with the patient today.  The patient does not have concerns regarding medicines.  The following changes have been made:  ***  Labs/ tests ordered today include: ***  No orders of the defined types were placed in this encounter.    Disposition:   FU with me in ***.    Signed, Rollene RotundaJames Dezmond Downie, MD  05/16/2021 8:06 PM    [facility]

## 2021-05-18 ENCOUNTER — Ambulatory Visit: Payer: Medicare Other | Admitting: Cardiology

## 2021-05-18 DIAGNOSIS — E785 Hyperlipidemia, unspecified: Secondary | ICD-10-CM

## 2021-05-18 DIAGNOSIS — I2510 Atherosclerotic heart disease of native coronary artery without angina pectoris: Secondary | ICD-10-CM

## 2021-05-18 DIAGNOSIS — N1831 Chronic kidney disease, stage 3a: Secondary | ICD-10-CM

## 2021-06-07 NOTE — Progress Notes (Deleted)
Cardiology Office Note   Date:  06/07/2021   ID:  Jenna Diaz, DOB [birth year], MRN [id number]  PCP:  Kirstie PeriShah, Ashish, MD  Cardiologist:   None   No chief complaint on file.     History of Present Illness: Jenna Diaz is a 76 y.o. female who presents for follow up of CAD.  Cardiac cath in 2012 demonstrated a 60-70% mid LAD stenosis which was significant by FFR. The stenosis in the proximal RCA was not significant. She underwent an angioplasty and drug-eluting stent placement to the LAD without complications. In 2015 she had stress perfusion imaging which was negative for any evidence of ischemia with a well preserved ejection fraction.  This was done to evaluate increased DOE.   There was another Scientist, physiologicalLexiscan Myoview [facility].   This was negative for ischemia.   This was in 2018.  She had some chest discomfort in 2020 and again a negative perfusion study.  Since I last saw her ***   ***  she has done well.  The patient denies any new symptoms such as chest discomfort, neck or arm discomfort. There has been no new shortness of breath, PND or orthopnea. There have been no reported palpitations, presyncope or syncope.  She is limited by knee pain and wants to have knee surgery if she can lose weight and she actually has lost some   Past Medical History:  Diagnosis Date   Anemia    Anxiety    Bowel obstruction (HCC) 1997   Bursitis    CAD (coronary artery disease)    A. 11/2010 - Cath: 60% LAD, 50% RCA ;  B. s/p Promus DES to LAD (by FFR 12/12);  RCA ok by FFR 12/12   Carpal tunnel syndrome    Cholelithiasis    Chronic kidney disease    questionable   Diabetes mellitus    Esophageal stricture    Essential hypertension    Ganglion cyst    Gastric ulcer    Gastroparesis    GERD (gastroesophageal reflux disease)    PUD with UGI bleed in 8/12 dx by EGD   Hyperlipemia    Hypertension    Hypothyroidism    Morbid obesity (HCC)    Osteoarthritis     Peripheral vascular disease (HCC)    Sleep apnea    CPAP, pt does not know settings    Past Surgical History:  Procedure Laterality Date   ABDOMINAL HYSTERECTOMY  1970's   CATARACT EXTRACTION W/PHACO Left 02/07/2016   Procedure: CATARACT EXTRACTION PHACO AND INTRAOCULAR LENS PLACEMENT LEFT EYE;  Surgeon: Fabio PierceJames Wrzosek, MD;  Location: [facility];  Service: Ophthalmology;  Laterality: Left;  CDE: 4.12   CATARACT EXTRACTION W/PHACO Right 02/24/2016   Procedure: CATARACT EXTRACTION PHACO AND INTRAOCULAR LENS PLACEMENT (IOC) CDE 4.37;  Surgeon: Fabio PierceJames Wrzosek, MD;  Location: [facility];  Service: Ophthalmology;  Laterality: Right;  right   COLON RESECTION  1990's   COLON SURGERY     COLONECTOMY WEARS A BAG   DILATION AND CURETTAGE OF UTERUS     "before hysterectomy"   ESOPHAGOGASTRODUODENOSCOPY N/A 01/18/2016   Procedure: ESOPHAGOGASTRODUODENOSCOPY (EGD);  Surgeon: Hilarie FredricksonJohn N Perry, MD;  [facility];  Service: Endoscopy;  Laterality: N/A;   GASTROSTOMY W/ FEEDING TUBE  1990's   later removed   HAND SURGERY     cyst on left hand   LEFT AND RIGHT HEART CATHETERIZATION WITH CORONARY ANGIOGRAM N/A 04/28/2011   Procedure: LEFT AND RIGHT  HEART CATHETERIZATION WITH CORONARY ANGIOGRAM;  Surgeon: Tonny Bollman, MD;  Location: [facility] CATH LAB;  Service: Cardiovascular;  Laterality: N/A;   NISSEN FUNDOPLICATION     questionable - pt unsure of surgical name   UPPER GASTROINTESTINAL ENDOSCOPY     Family History  Problem Relation Age of Onset   Heart disease Mother        MI   Heart disease Maternal Grandmother    Heart disease Paternal Grandmother    Ovarian cancer Sister    Diabetes Sister        x3   Heart disease Brother    Esophageal cancer Father    Colon cancer Neg Hx    Stomach cancer Neg Hx    Social History   Socioeconomic History   Marital status: Single    Spouse name: Not on file   Number of children: 3   Years of education: Not on file   Highest education level: Not on file   Occupational History   Occupation: disability  Tobacco Use   Smoking status: Never   Smokeless tobacco: Never  Vaping Use   Vaping Use: Never used  Substance and Sexual Activity   Alcohol use: No   Drug use: No   Sexual activity: Never  Other Topics Concern   Not on file  Social History Narrative   Not on file   Social Determinants of Health   Financial Resource Strain: Not on file  Food Insecurity: Not on file  Transportation Needs: Not on file  Physical Activity: Not on file  Stress: Not on file  Social Connections: Not on file  Intimate Partner Violence: Not on file     Current Outpatient Medications  Medication Sig Dispense Refill   acetaminophen (TYLENOL) 500 MG tablet Take 1,000 mg by mouth every 6 (six) hours as needed for mild pain.      ADVAIR DISKUS 250-50 MCG/ACT AEPB Inhale 1 puff into the lungs 2 (two) times daily.     albuterol (PROAIR HFA) 108 (90 BASE) MCG/ACT inhaler Inhale 2 puffs into the lungs every 6 (six) hours as needed for wheezing. 1 Inhaler 5   amLODipine (NORVASC) 2.5 MG tablet Take 2.5 mg by mouth daily.     aspirin EC 81 MG tablet Take 81 mg by mouth daily.      escitalopram (LEXAPRO) 20 MG tablet Take 20 mg by mouth daily.   2   Ferrous Sulfate (FERROUSUL PO) Take 34 mg by mouth in the morning, at noon, in the evening, and at bedtime.     fluticasone (FLONASE) 50 MCG/ACT nasal spray Place 2 sprays into both nostrils daily. 1 g 2   furosemide (LASIX) 40 MG tablet Take 40 mg by mouth daily. For fluid retention     gabapentin (NEURONTIN) 300 MG capsule Take 300 mg by mouth 3 (three) times daily.      glimepiride (AMARYL) 2 MG tablet Take 2 mg by mouth in the morning.     insulin lispro (HUMALOG) 100 UNIT/ML injection Inject 12-14 Units into the skin 3 (three) times daily before meals. 14 units at breakfast, 12 units at lunch, 14 units at dinner     Insulin Pen Needle 32G X 6 MM MISC 32 g by Does not apply route.     levothyroxine (SYNTHROID,  LEVOTHROID) 150 MCG tablet Take 150 mcg by mouth daily.       loratadine (CLARITIN) 10 MG tablet Take 10 mg by mouth daily.     loratadine (  CLARITIN) 10 MG tablet Take by mouth.     losartan (COZAAR) 50 MG tablet Take 50 mg by mouth daily.     Menthol-Methyl Salicylate (MUSCLE RUB) 10-15 % CREA Apply 1 application topically as needed (for pain in hands).     Multiple Vitamin (MULTIVITAMIN WITH MINERALS) TABS tablet Take 1 tablet by mouth daily. (Patient not taking: Reported on 05/05/2020)     nitroGLYCERIN (NITROSTAT) 0.4 MG SL tablet Place 1 tablet (0.4 mg total) under the tongue every 5 (five) minutes as needed for chest pain. 25 tablet prn   OZEMPIC, 0.25 OR 0.5 MG/DOSE, 2 MG/1.5ML SOPN Inject into the skin. Inject into the skin.     pantoprazole (PROTONIX) 40 MG tablet Take 40 mg by mouth 2 (two) times daily.      potassium chloride SA (K-DUR,KLOR-CON) 20 MEQ tablet Take 20 mEq by mouth daily.   2   pravastatin (PRAVACHOL) 80 MG tablet Take 80 mg by mouth at bedtime.     prednisoLONE acetate (PRED FORTE) 1 % ophthalmic suspension Place 1 drop into the left eye 3 (three) times daily.  (Patient not taking: Reported on 05/05/2020)  0   rosuvastatin (CRESTOR) 10 MG tablet Take 10 mg by mouth daily.     TOUJEO SOLOSTAR 300 UNIT/ML SOPN Inject 60 Units as directed at bedtime.  2   Current Facility-Administered Medications  Medication Dose Route Frequency Provider Last Rate Last Admin   0.9 %  sodium chloride infusion  500 mL Intravenous Continuous Hilarie Fredrickson, MD        Allergies:   Patient has no known allergies.    ROS:  Please see the history of present illness.   Otherwise, review of systems are positive for ***.   All other systems are reviewed and negative.    PHYSICAL EXAM: VS:  There were no vitals taken for this visit. , BMI There is no height or weight on file to calculate BMI. GENERAL:  Well appearing NECK:  No jugular venous distention, waveform within normal limits, carotid  upstroke brisk and symmetric, no bruits, no thyromegaly LUNGS:  Clear to auscultation bilaterally CHEST:  Unremarkable HEART:  PMI not displaced or sustained,S1 and S2 within normal limits, no S3, no S4, no clicks, no rubs, *** murmurs ABD:  Flat, positive bowel sounds normal in frequency in pitch, no bruits, no rebound, no guarding, no midline pulsatile mass, no hepatomegaly, no splenomegaly EXT:  2 plus pulses throughout, no edema, no cyanosis no clubbing    ***GENERAL:  Well appearing NECK:  No jugular venous distention, waveform within normal limits, carotid upstroke brisk and symmetric, no bruits, no thyromegaly LUNGS:  Clear to auscultation bilaterally CHEST:  Unremarkable HEART:  PMI not displaced or sustained,S1 and S2 within normal limits, no S3, no S4, no clicks, no rubs, no murmurs ABD:  Flat, positive bowel sounds normal in frequency in pitch, no bruits, no rebound, no guarding, no midline pulsatile mass, no hepatomegaly, no splenomegaly,  EXT:  2 plus pulses throughout, no edema, no cyanosis no clubbing    EKG:  EKG is *** ordered today. The ekg ordered today demonstrates sinus rhythm, rate ***, axis within normal limits, intervals within normal limits, no acute ST-T wave changes.   Recent Labs: No results found for requested labs within last 8760 hours.     Wt Readings from Last 3 Encounters:  05/05/20 259 lb (117.5 kg)  01/15/19 266 lb (120.7 kg)  11/14/17 264 lb (119.7 kg)  Other studies Reviewed: Additional studies/ records that were reviewed today include: *** Review of the above records demonstrates: ***  ASSESSMENT AND PLAN:  CAD:  ***  The patient has no new sypmtoms.  No further cardiovascular testing is indicated.  We will continue with aggressive risk reduction and meds as listed.  CKD:    ***  I do not have the most recent labs.  I will defer to Kirstie Peri, MD  HYPERLIPIDEMIA:   LDL goal was *** I will defer to Kirstie Peri, MD with a goal  LDL less than 70.    Current medicines are reviewed at length with the patient today.  The patient does not have concerns regarding medicines.  The following changes have been made:  ***  Labs/ tests ordered today include: ***  No orders of the defined types were placed in this encounter.    Disposition:   FU with me in ***.    Signed, Rollene Rotunda, MD  06/07/2021 8:19 PM    [facility]

## 2021-06-08 ENCOUNTER — Ambulatory Visit: Payer: Medicare Other | Admitting: Cardiology

## 2021-06-08 DIAGNOSIS — E785 Hyperlipidemia, unspecified: Secondary | ICD-10-CM

## 2021-06-08 DIAGNOSIS — I2510 Atherosclerotic heart disease of native coronary artery without angina pectoris: Secondary | ICD-10-CM

## 2021-06-08 DIAGNOSIS — N1831 Chronic kidney disease, stage 3a: Secondary | ICD-10-CM

## 2021-06-19 NOTE — Progress Notes (Signed)
Cardiology Office Note   Date:  06/21/2021   ID:  Jenna, Diaz 08/24/1945, MRN [id number]  PCP:  Monico Blitz, MD  Cardiologist:   None   Chief Complaint  Patient presents with   Coronary Artery Disease      History of Present Illness: Jenna Diaz is a 76 y.o. female who presents for follow up of CAD.  Cardiac cath in 2012 demonstrated a 60-70% mid LAD stenosis which was significant by FFR. The stenosis in the proximal RCA was not significant. She underwent an angioplasty and drug-eluting stent placement to the LAD without complications. In 2015 she had stress perfusion imaging which was negative for any evidence of ischemia with a well preserved ejection fraction.  This was done to evaluate increased DOE.   There was another Lexicographer at [facility] after this.   This was negative for ischemia.   This was in 2018.  She had some chest discomfort in 2020 and again a negative perfusion study.  Since I last saw her she has been limited by bilateral knee pain.  She is going to have knee surgery.  She denies any cardiovascular symptoms. The patient denies any new symptoms such as chest discomfort, neck or arm discomfort. There has been no new shortness of breath, PND or orthopnea. There have been no reported palpitations, presyncope or syncope.  She does get around with a walker.    Past Medical History:  Diagnosis Date   Anemia    Anxiety    Bowel obstruction ([place]) 1997   Bursitis    CAD (coronary artery disease)    A. 11/2010 - Cath: 60% LAD, 50% RCA ;  B. s/p Promus DES to LAD (by FFR 12/12);  RCA ok by FFR 12/12   Carpal tunnel syndrome    Cholelithiasis    Chronic kidney disease    questionable   Diabetes mellitus    Esophageal stricture    Essential hypertension    Ganglion cyst    Gastric ulcer    Gastroparesis    GERD (gastroesophageal reflux disease)    PUD with UGI bleed in 8/12 dx by EGD   Hyperlipemia    Hypertension     Hypothyroidism    Morbid obesity ([place])    Osteoarthritis    Peripheral vascular disease ([place])    Sleep apnea    CPAP, pt does not know settings    Past Surgical History:  Procedure Laterality Date   ABDOMINAL HYSTERECTOMY  1970's   CATARACT EXTRACTION W/PHACO Left 02/07/2016   Procedure: CATARACT EXTRACTION PHACO AND INTRAOCULAR LENS PLACEMENT LEFT EYE;  Surgeon: Baruch Goldmann, MD;  Location: [facility];  Service: Ophthalmology;  Laterality: Left;  CDE: 4.12   CATARACT EXTRACTION W/PHACO Right 02/24/2016   Procedure: CATARACT EXTRACTION PHACO AND INTRAOCULAR LENS PLACEMENT (IOC) CDE 4.37;  Surgeon: Baruch Goldmann, MD;  Location: [facility];  Service: Ophthalmology;  Laterality: Right;  right   COLON RESECTION  1990's   COLON SURGERY     COLONECTOMY WEARS A BAG   DILATION AND CURETTAGE OF UTERUS     "before hysterectomy"   ESOPHAGOGASTRODUODENOSCOPY N/A 01/18/2016   Procedure: ESOPHAGOGASTRODUODENOSCOPY (EGD);  Surgeon: Irene Shipper, MD;  Location: [facility];  Service: Endoscopy;  Laterality: N/A;   GASTROSTOMY W/ FEEDING TUBE  1990's   later removed   HAND SURGERY     cyst on left hand   LEFT AND RIGHT HEART CATHETERIZATION WITH CORONARY ANGIOGRAM N/A 04/28/2011  Procedure: LEFT AND RIGHT HEART CATHETERIZATION WITH CORONARY ANGIOGRAM;  Surgeon: Sherren Mocha, MD;  Location: [facility] CATH LAB;  Service: Cardiovascular;  Laterality: N/A;   NISSEN FUNDOPLICATION     questionable - pt unsure of surgical name   UPPER GASTROINTESTINAL ENDOSCOPY     Family History  Problem Relation Age of Onset   Heart disease Mother        MI   Heart disease Maternal Grandmother    Heart disease Paternal Grandmother    Ovarian cancer Sister    Diabetes Sister        x3   Heart disease Brother    Esophageal cancer Father    Colon cancer Neg Hx    Stomach cancer Neg Hx    Social History   Socioeconomic History   Marital status: Single    Spouse name: Not on file   Number of children: 3   Years of  education: Not on file   Highest education level: Not on file  Occupational History   Occupation: disability  Tobacco Use   Smoking status: Never   Smokeless tobacco: Never  Vaping Use   Vaping Use: Never used  Substance and Sexual Activity   Alcohol use: No   Drug use: No   Sexual activity: Never  Other Topics Concern   Not on file  Social History Narrative   Not on file   Social Determinants of Health   Financial Resource Strain: Not on file  Food Insecurity: Not on file  Transportation Needs: Not on file  Physical Activity: Not on file  Stress: Not on file  Social Connections: Not on file  Intimate Partner Violence: Not on file     Current Outpatient Medications  Medication Sig Dispense Refill   acetaminophen (TYLENOL) 500 MG tablet Take 1,000 mg by mouth every 6 (six) hours as needed for mild pain.      ADVAIR DISKUS 250-50 MCG/ACT AEPB Inhale 1 puff into the lungs 2 (two) times daily.     albuterol (PROAIR HFA) 108 (90 BASE) MCG/ACT inhaler Inhale 2 puffs into the lungs every 6 (six) hours as needed for wheezing. 1 Inhaler 5   amLODipine (NORVASC) 2.5 MG tablet Take 2.5 mg by mouth daily.     aspirin EC 81 MG tablet Take 81 mg by mouth daily.      escitalopram (LEXAPRO) 20 MG tablet Take 20 mg by mouth daily.   2   Ferrous Sulfate (FERROUSUL PO) Take 34 mg by mouth in the morning, at noon, in the evening, and at bedtime.     fluticasone (FLONASE) 50 MCG/ACT nasal spray Place 2 sprays into both nostrils daily. 1 g 2   furosemide (LASIX) 40 MG tablet Take 40 mg by mouth daily. For fluid retention     gabapentin (NEURONTIN) 300 MG capsule Take 300 mg by mouth 3 (three) times daily.      glimepiride (AMARYL) 2 MG tablet Take 2 mg by mouth in the morning.     insulin lispro (HUMALOG) 100 UNIT/ML injection Inject 12-14 Units into the skin 3 (three) times daily before meals. 14 units at breakfast, 12 units at lunch, 14 units at dinner     Insulin Pen Needle 32G X 6 MM MISC  32 g by Does not apply route.     levothyroxine (SYNTHROID, LEVOTHROID) 150 MCG tablet Take 150 mcg by mouth daily.       loratadine (CLARITIN) 10 MG tablet Take 10 mg by mouth daily.  loratadine (CLARITIN) 10 MG tablet Take by mouth.     losartan (COZAAR) 50 MG tablet Take 50 mg by mouth daily.     Menthol-Methyl Salicylate (MUSCLE RUB) 10-15 % CREA Apply 1 application topically as needed (for pain in hands).     Multiple Vitamin (MULTIVITAMIN WITH MINERALS) TABS tablet Take 1 tablet by mouth daily.     nitroGLYCERIN (NITROSTAT) 0.4 MG SL tablet Place 1 tablet (0.4 mg total) under the tongue every 5 (five) minutes as needed for chest pain. 25 tablet prn   OZEMPIC, 0.25 OR 0.5 MG/DOSE, 2 MG/1.5ML SOPN Inject into the skin. Inject into the skin.     pantoprazole (PROTONIX) 40 MG tablet Take 40 mg by mouth 2 (two) times daily.      potassium chloride SA (K-DUR,KLOR-CON) 20 MEQ tablet Take 20 mEq by mouth daily.   2   prednisoLONE acetate (PRED FORTE) 1 % ophthalmic suspension Place 1 drop into the left eye 3 (three) times daily.  0   TOUJEO SOLOSTAR 300 UNIT/ML SOPN Inject 60 Units as directed at bedtime.  2   traMADol (ULTRAM) 50 MG tablet Take 50 mg by mouth every 6 (six) hours as needed.     rosuvastatin (CRESTOR) 20 MG tablet Take 1 tablet (20 mg total) by mouth daily. 90 tablet 3   Current Facility-Administered Medications  Medication Dose Route Frequency Provider Last Rate Last Admin   0.9 %  sodium chloride infusion  500 mL Intravenous Continuous Irene Shipper, MD        Allergies:   Patient has no known allergies.    ROS:  Please see the history of present illness.   Otherwise, review of systems are positive for none.   All other systems are reviewed and negative.    PHYSICAL EXAM: VS:  BP 119/65    Pulse 64    Ht 5\' 5"  (1.651 m)    Wt 206 lb (93.4 kg)    SpO2 100%    BMI 34.28 kg/m  , BMI Body mass index is 34.28 kg/m. GENERAL:  Well appearing NECK:  No jugular venous  distention, waveform within normal limits, carotid upstroke brisk and symmetric, no bruits, no thyromegaly LUNGS:  Clear to auscultation bilaterally CHEST:  Unremarkable HEART:  PMI not displaced or sustained,S1 and S2 within normal limits, no S3, no S4, no clicks, no rubs, no murmurs ABD:  Flat, positive bowel sounds normal in frequency in pitch, no bruits, no rebound, no guarding, no midline pulsatile mass, no hepatomegaly, no splenomegaly EXT:  2 plus pulses throughout, no edema, no cyanosis no clubbing  EKG:  EKG is  ordered today. The ekg ordered today demonstrates sinus rhythm, rate 64, axis within normal limits, intervals within normal limits, no acute ST-T wave changes.   Recent Labs: No results found for requested labs within last 8760 hours.     Wt Readings from Last 3 Encounters:  06/21/21 206 lb (93.4 kg)  05/05/20 259 lb (117.5 kg)  01/15/19 266 lb (120.7 kg)      Other studies Reviewed: Additional studies/ records that were reviewed today include: Labs Review of the above records demonstrates: See elsewhere  ASSESSMENT AND PLAN:  CAD:  The patient has no new sypmtoms.  No further cardiovascular testing is indicated.  We will continue with aggressive risk reduction and meds as listed.  CKD:    He is 1.74.  This is being followed closely by  I will defer to Monico Blitz, MD  HYPERLIPIDEMIA:   LDL  I will was 127.  I am going to increase her Crestor to 20 mg daily.   PREOP:  The patient is not going for a high risk surgery.   She has no high risk signs or symptoms.  Therefore, based on ACC/AHA guidelines, the patient would be at acceptable risk for the planned procedure without further cardiovascular testing.     Current medicines are reviewed at length with the patient today.  The patient does not have concerns regarding medicines.  The following changes have been made:  As above  Labs/ tests ordered today include: None  No orders of the defined types were  placed in this encounter.    Disposition:   FU with me in 12 months. Ronnell Guadalajara, MD  06/21/2021 6:33 PM    [place]

## 2021-06-21 ENCOUNTER — Other Ambulatory Visit: Payer: Self-pay

## 2021-06-21 ENCOUNTER — Ambulatory Visit: Payer: Medicare Other | Admitting: Cardiology

## 2021-06-21 ENCOUNTER — Encounter: Payer: Self-pay | Admitting: Cardiology

## 2021-06-21 VITALS — BP 119/65 | HR 64 | Ht 65.0 in | Wt 206.0 lb

## 2021-06-21 DIAGNOSIS — E785 Hyperlipidemia, unspecified: Secondary | ICD-10-CM

## 2021-06-21 DIAGNOSIS — R0609 Other forms of dyspnea: Secondary | ICD-10-CM | POA: Diagnosis not present

## 2021-06-21 DIAGNOSIS — I2510 Atherosclerotic heart disease of native coronary artery without angina pectoris: Secondary | ICD-10-CM | POA: Diagnosis not present

## 2021-06-21 DIAGNOSIS — N1831 Chronic kidney disease, stage 3a: Secondary | ICD-10-CM | POA: Diagnosis not present

## 2021-06-21 MED ORDER — ROSUVASTATIN CALCIUM 20 MG PO TABS: 20.0000 mg | ORAL_TABLET | Freq: Every day | ORAL | 3 refills | Status: DC

## 2021-06-21 NOTE — Patient Instructions (Signed)
Medication Instructions:   INCREASE ROSUVASTATIN TO 20 MG ONCE DAILY= 2 OF THE 10 MG TABLETS ONCE DAILY  *If you need a refill on your cardiac medications before your next appointment, please call your pharmacy*   Follow-Up: At [facility], you and your health needs are our priority.  As part of our continuing mission to provide you with exceptional heart care, we have created designated Provider Care Teams.  These Care Teams include your primary Cardiologist (physician) and Advanced Practice Providers (APPs -  Physician Assistants and Nurse Practitioners) who all work together to provide you with the care you need, when you need it.  We recommend signing up for the patient portal called "MyChart".  Sign up information is provided on this After Visit Summary.  MyChart is used to connect with patients for Virtual Visits (Telemedicine).  Patients are able to view lab/test results, encounter notes, upcoming appointments, etc.  Non-urgent messages can be sent to your provider as well.   To learn more about what you can do with MyChart, go to [email or web address].    Your next appointment:   12 month(s)  The format for your next appointment:   In Person  Provider:   Rollene Rotunda MD

## 2021-06-22 NOTE — Addendum Note (Signed)
Addended by: Wonda Horner on: 06/22/2021 04:02 PM   Modules accepted: Orders

## 2021-08-29 ENCOUNTER — Other Ambulatory Visit (HOSPITAL_COMMUNITY): Payer: Self-pay | Admitting: Nephrology

## 2021-08-29 ENCOUNTER — Other Ambulatory Visit: Payer: Self-pay | Admitting: Nephrology

## 2021-08-29 DIAGNOSIS — I129 Hypertensive chronic kidney disease with stage 1 through stage 4 chronic kidney disease, or unspecified chronic kidney disease: Secondary | ICD-10-CM

## 2021-08-29 DIAGNOSIS — E1122 Type 2 diabetes mellitus with diabetic chronic kidney disease: Secondary | ICD-10-CM

## 2021-09-05 ENCOUNTER — Ambulatory Visit (HOSPITAL_COMMUNITY)
Admission: RE | Admit: 2021-09-05 | Discharge: 2021-09-05 | Disposition: A | Discharge status: Home / Self Care | Payer: Medicare Other | Source: Ambulatory Visit | Attending: Nephrology | Admitting: Nephrology

## 2021-09-05 DIAGNOSIS — E1122 Type 2 diabetes mellitus with diabetic chronic kidney disease: Secondary | ICD-10-CM | POA: Diagnosis present

## 2021-09-05 DIAGNOSIS — I129 Hypertensive chronic kidney disease with stage 1 through stage 4 chronic kidney disease, or unspecified chronic kidney disease: Secondary | ICD-10-CM | POA: Diagnosis present

## 2021-09-21 ENCOUNTER — Other Ambulatory Visit (HOSPITAL_COMMUNITY)
Admission: RE | Admit: 2021-09-21 | Discharge: 2021-09-21 | Disposition: A | Discharge status: Home / Self Care | Payer: Medicare Other | Source: Ambulatory Visit | Attending: Internal Medicine | Admitting: Internal Medicine

## 2021-09-21 ENCOUNTER — Ambulatory Visit (HOSPITAL_COMMUNITY)
Admission: RE | Admit: 2021-09-21 | Discharge: 2021-09-21 | Disposition: A | Discharge status: Home / Self Care | Payer: Medicare Other | Source: Ambulatory Visit | Attending: Gerontology | Admitting: Gerontology

## 2021-09-21 ENCOUNTER — Other Ambulatory Visit (HOSPITAL_COMMUNITY): Payer: Self-pay | Admitting: Gerontology

## 2021-09-21 DIAGNOSIS — R059 Cough, unspecified: Secondary | ICD-10-CM | POA: Insufficient documentation

## 2021-09-21 DIAGNOSIS — E039 Hypothyroidism, unspecified: Secondary | ICD-10-CM | POA: Diagnosis present

## 2021-09-21 DIAGNOSIS — M25561 Pain in right knee: Secondary | ICD-10-CM

## 2021-09-21 DIAGNOSIS — M25562 Pain in left knee: Secondary | ICD-10-CM | POA: Diagnosis present

## 2021-09-21 DIAGNOSIS — E1165 Type 2 diabetes mellitus with hyperglycemia: Secondary | ICD-10-CM | POA: Insufficient documentation

## 2021-09-21 DIAGNOSIS — Z1159 Encounter for screening for other viral diseases: Secondary | ICD-10-CM | POA: Insufficient documentation

## 2021-09-21 DIAGNOSIS — I10 Essential (primary) hypertension: Secondary | ICD-10-CM | POA: Diagnosis present

## 2021-09-21 LAB — BASIC METABOLIC PANEL
Anion gap: 5 (ref 5–15)
BUN: 18 mg/dL (ref 8–23)
CO2: 26 mmol/L (ref 22–32)
Calcium: 9.4 mg/dL (ref 8.9–10.3)
Chloride: 107 mmol/L (ref 98–111)
Creatinine, Ser: 1.62 mg/dL — ABNORMAL HIGH (ref 0.44–1.00)
GFR, Estimated: 33 mL/min — ABNORMAL LOW (ref >60)
Glucose, Bld: 134 mg/dL — ABNORMAL HIGH (ref 70–99)
Potassium: 4 mmol/L (ref 3.5–5.1)
Sodium: 138 mmol/L (ref 135–145)

## 2021-09-21 LAB — HEMOGLOBIN A1C
Hgb A1c MFr Bld: 7.5 % — ABNORMAL HIGH (ref 4.8–5.6)
Mean Plasma Glucose: 168.55 mg/dL

## 2021-09-21 LAB — CBC WITH DIFFERENTIAL/PLATELET
Abs Immature Granulocytes: 0.02 10*3/uL (ref 0.00–0.07)
Basophils Absolute: 0 10*3/uL (ref 0.0–0.1)
Basophils Relative: 0 %
Eosinophils Absolute: 0.1 10*3/uL (ref 0.0–0.5)
Eosinophils Relative: 1 %
HCT: 36 % (ref 36.0–46.0)
Hemoglobin: 11.9 g/dL — ABNORMAL LOW (ref 12.0–15.0)
Immature Granulocytes: 0 %
Lymphocytes Relative: 24 %
Lymphs Abs: 2.2 10*3/uL (ref 0.7–4.0)
MCH: 32.6 pg (ref 26.0–34.0)
MCHC: 33.1 g/dL (ref 30.0–36.0)
MCV: 98.6 fL (ref 80.0–100.0)
Monocytes Absolute: 0.8 10*3/uL (ref 0.1–1.0)
Monocytes Relative: 9 %
Neutro Abs: 6.1 10*3/uL (ref 1.7–7.7)
Neutrophils Relative %: 66 %
Platelets: 224 10*3/uL (ref 150–400)
RBC: 3.65 MIL/uL — ABNORMAL LOW (ref 3.87–5.11)
RDW: 13.9 % (ref 11.5–15.5)
WBC: 9.4 10*3/uL (ref 4.0–10.5)
nRBC: 0 % (ref 0.0–0.2)

## 2021-09-21 LAB — HEPATIC FUNCTION PANEL
ALT: 16 U/L (ref 0–44)
AST: 20 U/L (ref 15–41)
Albumin: 3.7 g/dL (ref 3.5–5.0)
Alkaline Phosphatase: 65 U/L (ref 38–126)
Bilirubin, Direct: 0.1 mg/dL (ref 0.0–0.2)
Indirect Bilirubin: 0.4 mg/dL (ref 0.3–0.9)
Total Bilirubin: 0.5 mg/dL (ref 0.3–1.2)
Total Protein: 6.9 g/dL (ref 6.5–8.1)

## 2021-09-21 LAB — HEPATITIS C ANTIBODY: HCV Ab: NONREACTIVE

## 2021-09-21 LAB — LIPID PANEL
Cholesterol: 274 mg/dL — ABNORMAL HIGH (ref 0–200)
HDL: 61 mg/dL (ref >40)
LDL Cholesterol: 140 mg/dL — ABNORMAL HIGH (ref 0–99)
Total CHOL/HDL Ratio: 4.5 RATIO
Triglycerides: 367 mg/dL — ABNORMAL HIGH (ref <150)
VLDL: 73 mg/dL — ABNORMAL HIGH (ref 0–40)

## 2021-09-21 LAB — TSH: TSH: 0.8 u[IU]/mL (ref 0.350–4.500)

## 2021-09-23 ENCOUNTER — Other Ambulatory Visit (HOSPITAL_COMMUNITY): Payer: Self-pay | Admitting: Nephrology

## 2021-09-23 DIAGNOSIS — I10 Essential (primary) hypertension: Secondary | ICD-10-CM

## 2021-09-23 LAB — T4: T4, Total: 7.6 ug/dL (ref 4.5–12.0)

## 2021-09-30 ENCOUNTER — Ambulatory Visit (HOSPITAL_COMMUNITY)
Admission: RE | Admit: 2021-09-30 | Discharge: 2021-09-30 | Disposition: A | Discharge status: Home / Self Care | Payer: Medicare Other | Source: Ambulatory Visit | Attending: Internal Medicine | Admitting: Internal Medicine

## 2021-09-30 DIAGNOSIS — I10 Essential (primary) hypertension: Secondary | ICD-10-CM | POA: Insufficient documentation

## 2021-09-30 LAB — ECHOCARDIOGRAM COMPLETE
AR max vel: 3.16 cm2
AV Area VTI: 3.07 cm2
AV Area mean vel: 3.1 cm2
AV Mean grad: 5 mmHg
AV Peak grad: 8.5 mmHg
Ao pk vel: 1.46 m/s
Area-P 1/2: 3.08 cm2
S' Lateral: 2.7 cm

## 2021-09-30 NOTE — Progress Notes (Signed)
*  PRELIMINARY RESULTS* Echocardiogram 2D Echocardiogram has been performed.  Jenna Diaz 09/30/2021, 12:29 PM

## 2021-11-08 ENCOUNTER — Ambulatory Visit: Payer: Medicare Other | Admitting: Orthopedic Surgery

## 2021-11-18 ENCOUNTER — Encounter: Payer: Self-pay | Admitting: Orthopedic Surgery

## 2021-11-18 ENCOUNTER — Ambulatory Visit: Payer: Medicare Other | Admitting: Orthopedic Surgery

## 2021-11-18 VITALS — BP 128/93 | HR 71 | Ht 68.0 in | Wt 251.0 lb

## 2021-11-18 DIAGNOSIS — M170 Bilateral primary osteoarthritis of knee: Secondary | ICD-10-CM

## 2021-11-18 MED ORDER — METHYLPREDNISOLONE ACETATE 40 MG/ML IJ SUSP
40.0000 mg | Freq: Once | INTRAMUSCULAR | Status: AC
  Administered 2021-11-18: 40 mg via INTRA_ARTICULAR

## 2021-11-18 NOTE — Progress Notes (Signed)
New Patient Visit  Assessment: Jenna Diaz is a 76 y.o. female with the following: 1. Bilateral primary osteoarthritis of knee  Plan: CORTNEE STEINMILLER has advanced degenerative changes in bilateral knees.  She has previously been offered total knee arthroplasty by another provider.  She has multiple medical comorbidities, and her BMI is currently 24.  She is a diabetic, with elevated A1c.  Overall, she is not a good candidate for surgery.  As such, I recommended we continue to manage her symptoms.  I offered her bilateral knee steroid injections, and she elected to proceed.  This was completed in clinic.  Follow-up as needed.  Procedure note injection Left knee joint   Verbal consent was obtained to inject the left knee joint  Timeout was completed to confirm the site of injection.  The skin was prepped with alcohol and ethyl chloride was sprayed at the injection site.  A 21-gauge needle was used to inject 40 mg of Depo-Medrol and 1% lidocaine (3 cc) into the left knee using an anterolateral approach.  There were no complications. A sterile bandage was applied.   Procedure note injection Right knee joint   Verbal consent was obtained to inject the right knee joint  Timeout was completed to confirm the site of injection.  The skin was prepped with alcohol and ethyl chloride was sprayed at the injection site.  A 21-gauge needle was used to inject 40 mg of Depo-Medrol and 1% lidocaine (3 cc) into the right knee using an anterolateral approach.  There were no complications. A sterile bandage was applied.   Follow-up: Return if symptoms worsen or fail to improve.  Subjective:  Chief Complaint  Patient presents with   Knee Pain    Bilateral, pain about the same in both    History of Present Illness: Jenna Diaz is a 76 y.o. female who presents for evaluation of bilateral knee pain.  Currently, the right is worse than the left.  No recent injury.  She has known  degenerative changes within both knees.  She has had multiple steroid injections in the past.  The most recent injection was greater than 3 months ago.  She has had different amounts of relief following injections.  She has discussed total knee arthroplasty with prior consultants, but her medical problems create too much risk.  She uses a cane to assist with ambulation.   Review of Systems: No fevers or chills No numbness or tingling No chest pain No shortness of breath No bowel or bladder dysfunction No GI distress No headaches   Medical History:  Past Medical History:  Diagnosis Date   Anemia    Anxiety    Bowel obstruction ([facility]) 1997   Bursitis    CAD (coronary artery disease)    A. 11/2010 - Cath: 60% LAD, 50% RCA ;  B. s/p Promus DES to LAD (by FFR 12/12);  RCA ok by FFR 12/12   Carpal tunnel syndrome    Cholelithiasis    Chronic kidney disease    questionable   Diabetes mellitus    Esophageal stricture    Essential hypertension    Ganglion cyst    Gastric ulcer    Gastroparesis    GERD (gastroesophageal reflux disease)    PUD with UGI bleed in 8/12 dx by EGD   Hyperlipemia    Hypertension    Hypothyroidism    Morbid obesity ([facility])    Osteoarthritis    Peripheral vascular disease ([facility])    Sleep  apnea    CPAP, pt does not know settings    Past Surgical History:  Procedure Laterality Date   ABDOMINAL HYSTERECTOMY  1970's   CATARACT EXTRACTION W/PHACO Left 02/07/2016   Procedure: CATARACT EXTRACTION PHACO AND INTRAOCULAR LENS PLACEMENT LEFT EYE;  Surgeon: Fabio Pierce, MD;  Location: [facility];  Service: Ophthalmology;  Laterality: Left;  CDE: 4.12   CATARACT EXTRACTION W/PHACO Right 02/24/2016   Procedure: CATARACT EXTRACTION PHACO AND INTRAOCULAR LENS PLACEMENT (IOC) CDE 4.37;  Surgeon: Fabio Pierce, MD;  Location: [facility];  Service: Ophthalmology;  Laterality: Right;  right   COLON RESECTION  1990's   COLON SURGERY     COLONECTOMY WEARS A BAG   DILATION AND  CURETTAGE OF UTERUS     "before hysterectomy"   ESOPHAGOGASTRODUODENOSCOPY N/A 01/18/2016   Procedure: ESOPHAGOGASTRODUODENOSCOPY (EGD);  Surgeon: Hilarie Fredrickson, MD;  Location: [facility];  Service: Endoscopy;  Laterality: N/A;   GASTROSTOMY W/ FEEDING TUBE  1990's   later removed   HAND SURGERY     cyst on left hand   LEFT AND RIGHT HEART CATHETERIZATION WITH CORONARY ANGIOGRAM N/A 04/28/2011   Procedure: LEFT AND RIGHT HEART CATHETERIZATION WITH CORONARY ANGIOGRAM;  Surgeon: Tonny Bollman, MD;  Location: [facility] CATH LAB;  Service: Cardiovascular;  Laterality: N/A;   NISSEN FUNDOPLICATION     questionable - pt unsure of surgical name   UPPER GASTROINTESTINAL ENDOSCOPY      Family History  Problem Relation Age of Onset   Heart disease Mother        MI   Heart disease Maternal Grandmother    Heart disease Paternal Grandmother    Ovarian cancer Sister    Diabetes Sister        x3   Heart disease Brother    Esophageal cancer Father    Colon cancer Neg Hx    Stomach cancer Neg Hx    Social History   Tobacco Use   Smoking status: Never   Smokeless tobacco: Never  Vaping Use   Vaping Use: Never used  Substance Use Topics   Alcohol use: No   Drug use: No    No Known Allergies  Current Meds  Medication Sig   acetaminophen (TYLENOL) 500 MG tablet Take 1,000 mg by mouth every 6 (six) hours as needed for mild pain.    ADVAIR DISKUS 250-50 MCG/ACT AEPB Inhale 1 puff into the lungs 2 (two) times daily.   amLODipine (NORVASC) 2.5 MG tablet Take 2.5 mg by mouth daily.   aspirin EC 81 MG tablet Take 81 mg by mouth daily.    escitalopram (LEXAPRO) 20 MG tablet Take 20 mg by mouth daily.    Ferrous Sulfate (FERROUSUL PO) Take 34 mg by mouth in the morning, at noon, in the evening, and at bedtime.   fluticasone (FLONASE) 50 MCG/ACT nasal spray Place 2 sprays into both nostrils daily.   furosemide (LASIX) 40 MG tablet Take 40 mg by mouth daily. For fluid retention   gabapentin  (NEURONTIN) 300 MG capsule Take 300 mg by mouth 3 (three) times daily.    glimepiride (AMARYL) 2 MG tablet Take 2 mg by mouth in the morning.   insulin lispro (HUMALOG) 100 UNIT/ML injection Inject 12-14 Units into the skin 3 (three) times daily before meals. 14 units at breakfast, 12 units at lunch, 14 units at dinner   Insulin Pen Needle 32G X 6 MM MISC 32 g by Does not apply route.   levothyroxine (SYNTHROID, LEVOTHROID) 150 MCG  tablet Take 150 mcg by mouth daily.     loratadine (CLARITIN) 10 MG tablet Take 10 mg by mouth daily.   loratadine (CLARITIN) 10 MG tablet Take by mouth.   losartan (COZAAR) 50 MG tablet Take 50 mg by mouth daily.   Menthol-Methyl Salicylate (MUSCLE RUB) 10-15 % CREA Apply 1 application topically as needed (for pain in hands).   Multiple Vitamin (MULTIVITAMIN WITH MINERALS) TABS tablet Take 1 tablet by mouth daily.   nitroGLYCERIN (NITROSTAT) 0.4 MG SL tablet Place 1 tablet (0.4 mg total) under the tongue every 5 (five) minutes as needed for chest pain.   OZEMPIC, 0.25 OR 0.5 MG/DOSE, 2 MG/1.5ML SOPN Inject into the skin. Inject into the skin.   pantoprazole (PROTONIX) 40 MG tablet Take 40 mg by mouth 2 (two) times daily.    potassium chloride SA (K-DUR,KLOR-CON) 20 MEQ tablet Take 20 mEq by mouth daily.    prednisoLONE acetate (PRED FORTE) 1 % ophthalmic suspension Place 1 drop into the left eye 3 (three) times daily.   rosuvastatin (CRESTOR) 20 MG tablet Take 1 tablet (20 mg total) by mouth daily.   TOUJEO SOLOSTAR 300 UNIT/ML SOPN Inject 60 Units as directed at bedtime.   traMADol (ULTRAM) 50 MG tablet Take 50 mg by mouth every 6 (six) hours as needed.   Current Facility-Administered Medications for the 11/18/21 encounter (Office Visit) with Oliver Barre, MD  Medication   0.9 %  sodium chloride infusion    Objective: BP (!) 128/93   Pulse 71   Ht 5\' 8"  (1.727 m)   Wt 251 lb (113.9 kg)   BMI 38.16 kg/m   Physical Exam:  General: Alert and oriented. and  No acute distress.  Seated in wheelchair Gait: Ambulates with the assistance of a cane.  Bilateral knees without deformity.  No swelling.  Tenderness to palpation along both the medial and lateral joint lines.  She is able to achieve full extension.  She tolerates flexion beyond 90 degrees.    IMAGING: I personally reviewed images previously obtained in clinic  Prior x-rays were reviewed in clinic today.  She has advanced degenerative changes in bilateral knees.   New Medications:  Meds ordered this encounter  Medications   methylPREDNISolone acetate (DEPO-MEDROL) injection 40 mg   methylPREDNISolone acetate (DEPO-MEDROL) injection 40 mg      , MD  11/18/2021 1:13 PM

## 2021-11-18 NOTE — Patient Instructions (Signed)

## 2022-01-03 ENCOUNTER — Encounter (INDEPENDENT_AMBULATORY_CARE_PROVIDER_SITE_OTHER): Payer: Self-pay

## 2022-01-10 ENCOUNTER — Encounter (INDEPENDENT_AMBULATORY_CARE_PROVIDER_SITE_OTHER): Payer: Medicare Other | Admitting: Ophthalmology

## 2022-01-16 ENCOUNTER — Encounter (INDEPENDENT_AMBULATORY_CARE_PROVIDER_SITE_OTHER): Payer: Self-pay | Admitting: Ophthalmology

## 2022-01-16 ENCOUNTER — Ambulatory Visit (INDEPENDENT_AMBULATORY_CARE_PROVIDER_SITE_OTHER): Payer: Medicare Other | Admitting: Ophthalmology

## 2022-01-16 DIAGNOSIS — G4731 Primary central sleep apnea: Secondary | ICD-10-CM

## 2022-01-16 DIAGNOSIS — H43813 Vitreous degeneration, bilateral: Secondary | ICD-10-CM | POA: Diagnosis not present

## 2022-01-16 DIAGNOSIS — E119 Type 2 diabetes mellitus without complications: Secondary | ICD-10-CM | POA: Diagnosis not present

## 2022-01-16 DIAGNOSIS — H353132 Nonexudative age-related macular degeneration, bilateral, intermediate dry stage: Secondary | ICD-10-CM | POA: Diagnosis not present

## 2022-01-16 DIAGNOSIS — G4739 Other sleep apnea: Secondary | ICD-10-CM

## 2022-01-16 NOTE — Progress Notes (Signed)
01/16/2022     CHIEF COMPLAINT Patient presents for  Chief Complaint  Patient presents with   Retina Evaluation      HISTORY OF PRESENT ILLNESS: Jenna Diaz is a 76 y.o. female who presents to the clinic today for:   HPI     Retina Evaluation           Laterality: both eyes   Associated Symptoms: Negative for Flashes, Floaters, Distortion, Blind Spot, Pain, Redness, Photophobia, Glare, Trauma, Scalp Tenderness, Jaw Claudication, Shoulder/Hip pain, Fever, Weight Loss and Fatigue         Comments   NP- ARMDOU- ref by Gershon Crane. Pt stated vision is slightly blurry at a distance in both eyes. Pt reports no concerns with small print. Pt reports last A1C was 7.        Last edited by Silvestre Moment on 01/16/2022  2:57 PM.      Referring physician: Rutherford Guys, MD [address] [place],  [place] [address]  HISTORICAL INFORMATION:   Selected notes from the MEDICAL RECORD NUMBER    Lab Results  Component Value Date   HGBA1C 7.5 (H) 09/21/2021     CURRENT MEDICATIONS: Current Outpatient Medications (Ophthalmic Drugs)  Medication Sig   prednisoLONE acetate (PRED FORTE) 1 % ophthalmic suspension Place 1 drop into the left eye 3 (three) times daily.   No current facility-administered medications for this visit. (Ophthalmic Drugs)   Current Outpatient Medications (Other)  Medication Sig   acetaminophen (TYLENOL) 500 MG tablet Take 1,000 mg by mouth every 6 (six) hours as needed for mild pain.    ADVAIR DISKUS 250-50 MCG/ACT AEPB Inhale 1 puff into the lungs 2 (two) times daily.   albuterol (PROAIR HFA) 108 (90 BASE) MCG/ACT inhaler Inhale 2 puffs into the lungs every 6 (six) hours as needed for wheezing.   amLODipine (NORVASC) 2.5 MG tablet Take 2.5 mg by mouth daily.   aspirin EC 81 MG tablet Take 81 mg by mouth daily.    escitalopram (LEXAPRO) 20 MG tablet Take 20 mg by mouth daily.    Ferrous Sulfate (FERROUSUL PO) Take 34 mg by mouth in the morning, at  noon, in the evening, and at bedtime.   fluticasone (FLONASE) 50 MCG/ACT nasal spray Place 2 sprays into both nostrils daily.   furosemide (LASIX) 40 MG tablet Take 40 mg by mouth daily. For fluid retention   gabapentin (NEURONTIN) 300 MG capsule Take 300 mg by mouth 3 (three) times daily.    glimepiride (AMARYL) 2 MG tablet Take 2 mg by mouth in the morning.   insulin lispro (HUMALOG) 100 UNIT/ML injection Inject 12-14 Units into the skin 3 (three) times daily before meals. 14 units at breakfast, 12 units at lunch, 14 units at dinner   Insulin Pen Needle 32G X 6 MM MISC 32 g by Does not apply route.   levothyroxine (SYNTHROID, LEVOTHROID) 150 MCG tablet Take 150 mcg by mouth daily.     loratadine (CLARITIN) 10 MG tablet Take 10 mg by mouth daily.   loratadine (CLARITIN) 10 MG tablet Take by mouth.   losartan (COZAAR) 50 MG tablet Take 50 mg by mouth daily.   Menthol-Methyl Salicylate (MUSCLE RUB) 10-15 % CREA Apply 1 application topically as needed (for pain in hands).   Multiple Vitamin (MULTIVITAMIN WITH MINERALS) TABS tablet Take 1 tablet by mouth daily.   nitroGLYCERIN (NITROSTAT) 0.4 MG SL tablet Place 1 tablet (0.4 mg total) under the tongue every 5 (five) minutes  as needed for chest pain.   OZEMPIC, 0.25 OR 0.5 MG/DOSE, 2 MG/1.5ML SOPN Inject into the skin. Inject into the skin.   pantoprazole (PROTONIX) 40 MG tablet Take 40 mg by mouth 2 (two) times daily.    potassium chloride SA (K-DUR,KLOR-CON) 20 MEQ tablet Take 20 mEq by mouth daily.    rosuvastatin (CRESTOR) 20 MG tablet Take 1 tablet (20 mg total) by mouth daily.   TOUJEO SOLOSTAR 300 UNIT/ML SOPN Inject 60 Units as directed at bedtime.   traMADol (ULTRAM) 50 MG tablet Take 50 mg by mouth every 6 (six) hours as needed.   Current Facility-Administered Medications (Other)  Medication Route   0.9 %  sodium chloride infusion Intravenous      REVIEW OF SYSTEMS: ROS   Negative for: Constitutional, Gastrointestinal,  Neurological, Skin, Genitourinary, Musculoskeletal, HENT, Endocrine, Cardiovascular, Eyes, Respiratory, Psychiatric, Allergic/Imm, Heme/Lymph Last edited by Angeline Slim on 01/16/2022  2:57 PM.       ALLERGIES No Known Allergies  PAST MEDICAL HISTORY Past Medical History:  Diagnosis Date   Anemia    Anxiety    Bowel obstruction (HCC) 1997   Bursitis    CAD (coronary artery disease)    A. 11/2010 - Cath: 60% LAD, 50% RCA ;  B. s/p Promus DES to LAD (by FFR 12/12);  RCA ok by FFR 12/12   Carpal tunnel syndrome    Cholelithiasis    Chronic kidney disease    questionable   Diabetes mellitus    Esophageal stricture    Essential hypertension    Ganglion cyst    Gastric ulcer    Gastroparesis    GERD (gastroesophageal reflux disease)    PUD with UGI bleed in 8/12 dx by EGD   Hyperlipemia    Hypertension    Hypothyroidism    Morbid obesity (HCC)    Osteoarthritis    Peripheral vascular disease (HCC)    Sleep apnea    CPAP, pt does not know settings   Past Surgical History:  Procedure Laterality Date   ABDOMINAL HYSTERECTOMY  1970's   CATARACT EXTRACTION W/PHACO Left 02/07/2016   Procedure: CATARACT EXTRACTION PHACO AND INTRAOCULAR LENS PLACEMENT LEFT EYE;  Surgeon: Fabio Pierce, MD;  Location: [facility];  Service: Ophthalmology;  Laterality: Left;  CDE: 4.12   CATARACT EXTRACTION W/PHACO Right 02/24/2016   Procedure: CATARACT EXTRACTION PHACO AND INTRAOCULAR LENS PLACEMENT (IOC) CDE 4.37;  Surgeon: Fabio Pierce, MD;  Location: [facility];  Service: Ophthalmology;  Laterality: Right;  right   COLON RESECTION  1990's   COLON SURGERY     COLONECTOMY WEARS A BAG   DILATION AND CURETTAGE OF UTERUS     "before hysterectomy"   ESOPHAGOGASTRODUODENOSCOPY N/A 01/18/2016   Procedure: ESOPHAGOGASTRODUODENOSCOPY (EGD);  Surgeon: Hilarie Fredrickson, MD;  Location: [facility];  Service: Endoscopy;  Laterality: N/A;   GASTROSTOMY W/ FEEDING TUBE  1990's   later removed   HAND SURGERY     cyst on left  hand   LEFT AND RIGHT HEART CATHETERIZATION WITH CORONARY ANGIOGRAM N/A 04/28/2011   Procedure: LEFT AND RIGHT HEART CATHETERIZATION WITH CORONARY ANGIOGRAM;  Surgeon: Tonny Bollman, MD;  Location: [facility] CATH LAB;  Service: Cardiovascular;  Laterality: N/A;   NISSEN FUNDOPLICATION     questionable - pt unsure of surgical name   UPPER GASTROINTESTINAL ENDOSCOPY      FAMILY HISTORY Family History  Problem Relation Age of Onset   Heart disease Mother        MI  Heart disease Maternal Grandmother    Heart disease Paternal Grandmother    Ovarian cancer Sister    Diabetes Sister        x3   Heart disease Brother    Esophageal cancer Father    Colon cancer Neg Hx    Stomach cancer Neg Hx     SOCIAL HISTORY Social History   Tobacco Use   Smoking status: Never   Smokeless tobacco: Never  Vaping Use   Vaping Use: Never used  Substance Use Topics   Alcohol use: No   Drug use: No         OPHTHALMIC EXAM:  Base Eye Exam     Visual Acuity (ETDRS)       Right Left   Dist [place] 20/20 -1 20/20 -1         Tonometry (Tonopen, 3:02 PM)       Right Left   Pressure 11 14         Pupils       Pupils APD   Right PERRL None   Left PERRL None         Visual Fields       Left Right    Full Full         Extraocular Movement       Right Left    Full, Ortho Full, Ortho         Neuro/Psych     Oriented x3: Yes         Dilation     Both eyes: 1.0% Mydriacyl, 2.5% Phenylephrine @ 3:02 PM           Slit Lamp and Fundus Exam     External Exam       Right Left   External Normal Normal         Slit Lamp Exam       Right Left   Lids/Lashes Normal Normal   Conjunctiva/Sclera White and quiet White and quiet   Cornea Clear Clear   Anterior Chamber Deep and quiet Deep and quiet   Iris Round and reactive Round and reactive   Lens Centered posterior chamber intraocular lens Centered posterior chamber intraocular lens   Anterior Vitreous Normal  Normal         Fundus Exam       Right Left   Posterior Vitreous Posterior vitreous detachment Posterior vitreous detachment   Disc Normal Normal   C/D Ratio 0.35 0.35   Macula Intermediate age related macular degeneration, no macular thickening, no hemorrhage Intermediate age related macular degeneration, no macular thickening, no hemorrhage   Vessels Normal, no DR Normal, no DR   Periphery Normal Normal            IMAGING AND PROCEDURES  Imaging and Procedures for 01/16/22  OCT, Retina - OU - Both Eyes       Right Eye Quality was good. Scan locations included subfoveal. Central Foveal Thickness: 269. Progression has no prior data. Findings include abnormal foveal contour, retinal drusen .   Left Eye Scan locations included subfoveal. Central Foveal Thickness: 257. Progression has no prior data. Findings include abnormal foveal contour, retinal drusen .   Notes Intermediate ARMD OU.  No sign of CNVM     Color Fundus Photography Optos - OU - Both Eyes       Right Eye Progression has been stable. Disc findings include normal observations. Macula : drusen. Vessels : normal observations. Periphery : normal observations.  Left Eye Progression has been stable. Disc findings include normal observations. Macula : drusen. Vessels : normal observations. Periphery : normal observations.              ASSESSMENT/PLAN:  Posterior vitreous detachment of both eyes  The nature of posterior vitreous detachment was discussed with the patient as well as its physiology, its age prevalence, and its possible implication regarding retinal breaks and detachment.  An informational brochure was offered to the patient.  All the patient's questions were answered.  The patient was asked to return if new or different flashes or floaters develops.   Patient was instructed to contact office immediately if any new changes were noticed. I explained to the patient that vitreous inside the eye  is similar to jello inside a bowl. As the jello melts it can start to pull away from the bowl, similarly the vitreous throughout our lives can begin to pull away from the retina. That process is called a posterior vitreous detachment. In some cases, the vitreous can tug hard enough on the retina to form a retinal tear. I discussed with the patient the signs and symptoms of a retinal detachment.  Do not rub the eye.    Intermediate stage nonexudative age-related macular degeneration of both eyes The nature of age--related macular degeneration was discussed with the patient as well as the distinction between dry and wet types. Checking an Amsler Grid daily with advice to return immediately should a distortion develop, was given to the patient. The patient 's smoking status now and in the past was determined and advice based on the AREDS study was provided regarding the consumption of antioxidant supplements. AREDS 2 vitamin formulation was recommended. Consumption of dark leafy vegetables and fresh fruits of various colors was recommended. Treatment modalities for wet macular degeneration particularly the use of intravitreal injections of anti-blood vessel growth factors was discussed with the patient. Avastin, Lucentis, and Eylea are the available options. On occasion, therapy includes the use of photodynamic therapy and thermal laser. Stressed to the patient do not rub eyes.  Patient was advised to check Amsler Grid daily and return immediately if changes are noted. Instructions on using the grid were given to the patient. All patient questions were answered.  Complex sleep apnea syndrome Discussion with patient and family regarding sleep apnea and its complex role on the systemic system of triggering nightly hypoxia with transient hypertension posing a significant risk for stroke heart attack degenerative brain disease.  Successful prevention of nightly low oxygen that his hypoxia, allows the person to  feel rested the next morning not to have as many trips to the bathroom the next day and overall improved sense of wellbeing.     ICD-10-CM   1. Intermediate stage nonexudative age-related macular degeneration of both eyes  H35.3132 OCT, Retina - OU - Both Eyes    Color Fundus Photography Optos - OU - Both Eyes    2. Posterior vitreous detachment of both eyes  H43.813 Color Fundus Photography Optos - OU - Both Eyes    3. Complex sleep apnea syndrome  G47.31 Color Fundus Photography Optos - OU - Both Eyes    4. Diabetes mellitus without complication ([place])  [id number]       1.  No signs of complication of wet AMD at this time.  2.  Encouraged patient to read it be reassessed for the complex sleep apnea syndrome noted in the chart.  AHI's of over 30/h significant impose a risk finally  to the cerebrovascular and cardiovascular system but in my opinion to the retinal vascular system as well.  Lengthy explanation with family and also the patient regarding the potential use of CPAP to improve overall sense of wellbeing energy and tension of systemic disease  3.  Ophthalmic Meds Ordered this visit:  No orders of the defined types were placed in this encounter.      Return in about 6 months (around 07/17/2022) for DILATE OU, OCT, COLOR FP.  There are no Patient Instructions on file for this visit.   Explained the diagnoses, plan, and follow up with the patient and they expressed understanding.  Patient expressed understanding of the importance of proper follow up care.   Clent Demark Azeem Poorman M.D. Diseases & Surgery of the Retina and Vitreous [facility] [place] 01/16/22     Abbreviations: M myopia (nearsighted); A astigmatism; H hyperopia (farsighted); P presbyopia; Mrx spectacle prescription;  CTL contact lenses; OD right eye; OS left eye; OU both eyes  XT exotropia; ET esotropia; PEK punctate epithelial keratitis; PEE punctate epithelial erosions; DES dry eye syndrome; MGD meibomian  gland dysfunction; ATs artificial tears; PFAT's preservative free artificial tears; [place] nuclear sclerotic cataract; PSC posterior subcapsular cataract; ERM epi-retinal membrane; PVD posterior vitreous detachment; RD retinal detachment; DM diabetes mellitus; DR diabetic retinopathy; NPDR non-proliferative diabetic retinopathy; PDR proliferative diabetic retinopathy; CSME clinically significant macular edema; DME diabetic macular edema; dbh dot blot hemorrhages; CWS cotton wool spot; POAG primary open angle glaucoma; C/D cup-to-disc ratio; HVF humphrey visual field; GVF goldmann visual field; OCT optical coherence tomography; IOP intraocular pressure; BRVO Branch retinal vein occlusion; CRVO central retinal vein occlusion; CRAO central retinal artery occlusion; BRAO branch retinal artery occlusion; RT retinal tear; SB scleral buckle; PPV pars plana vitrectomy; VH Vitreous hemorrhage; PRP panretinal laser photocoagulation; IVK intravitreal kenalog; VMT vitreomacular traction; MH Macular hole;  NVD neovascularization of the disc; NVE neovascularization elsewhere; AREDS age related eye disease study; ARMD age related macular degeneration; POAG primary open angle glaucoma; EBMD epithelial/anterior basement membrane dystrophy; ACIOL anterior chamber intraocular lens; IOL intraocular lens; PCIOL posterior chamber intraocular lens; Phaco/IOL phacoemulsification with intraocular lens placement; [place] photorefractive keratectomy; LASIK laser assisted in situ keratomileusis; HTN hypertension; DM diabetes mellitus; COPD chronic obstructive pulmonary disease

## 2022-01-16 NOTE — Assessment & Plan Note (Signed)

## 2022-01-16 NOTE — Assessment & Plan Note (Signed)

## 2022-01-16 NOTE — Assessment & Plan Note (Signed)
Discussion with patient and family regarding sleep apnea and its complex role on the systemic system of triggering nightly hypoxia with transient hypertension posing a significant risk for stroke heart attack degenerative brain disease.  Successful prevention of nightly low oxygen that his hypoxia, allows the person to feel rested the next morning not to have as many trips to the bathroom the next day and overall improved sense of wellbeing.

## 2022-03-31 ENCOUNTER — Ambulatory Visit (HOSPITAL_COMMUNITY)
Admission: RE | Admit: 2022-03-31 | Discharge: 2022-03-31 | Disposition: A | Discharge status: Home / Self Care | Payer: Medicare Other | Source: Ambulatory Visit | Attending: Gerontology | Admitting: Gerontology

## 2022-03-31 ENCOUNTER — Other Ambulatory Visit (HOSPITAL_COMMUNITY): Payer: Self-pay | Admitting: Gerontology

## 2022-03-31 DIAGNOSIS — R059 Cough, unspecified: Secondary | ICD-10-CM | POA: Insufficient documentation

## 2022-05-05 DIAGNOSIS — E1142 Type 2 diabetes mellitus with diabetic polyneuropathy: Secondary | ICD-10-CM | POA: Diagnosis not present

## 2022-05-12 DIAGNOSIS — I10 Essential (primary) hypertension: Secondary | ICD-10-CM | POA: Diagnosis not present

## 2022-05-12 DIAGNOSIS — E1122 Type 2 diabetes mellitus with diabetic chronic kidney disease: Secondary | ICD-10-CM | POA: Diagnosis not present

## 2022-05-16 DIAGNOSIS — N189 Chronic kidney disease, unspecified: Secondary | ICD-10-CM | POA: Diagnosis not present

## 2022-05-16 DIAGNOSIS — E1122 Type 2 diabetes mellitus with diabetic chronic kidney disease: Secondary | ICD-10-CM | POA: Diagnosis not present

## 2022-05-16 DIAGNOSIS — I5032 Chronic diastolic (congestive) heart failure: Secondary | ICD-10-CM | POA: Diagnosis not present

## 2022-05-16 DIAGNOSIS — Z79899 Other long term (current) drug therapy: Secondary | ICD-10-CM | POA: Diagnosis not present

## 2022-05-16 DIAGNOSIS — N170 Acute kidney failure with tubular necrosis: Secondary | ICD-10-CM | POA: Diagnosis not present

## 2022-05-22 DIAGNOSIS — E211 Secondary hyperparathyroidism, not elsewhere classified: Secondary | ICD-10-CM | POA: Diagnosis not present

## 2022-05-22 DIAGNOSIS — D638 Anemia in other chronic diseases classified elsewhere: Secondary | ICD-10-CM | POA: Diagnosis not present

## 2022-05-22 DIAGNOSIS — N189 Chronic kidney disease, unspecified: Secondary | ICD-10-CM | POA: Diagnosis not present

## 2022-05-22 DIAGNOSIS — I129 Hypertensive chronic kidney disease with stage 1 through stage 4 chronic kidney disease, or unspecified chronic kidney disease: Secondary | ICD-10-CM | POA: Diagnosis not present

## 2022-05-22 DIAGNOSIS — I5032 Chronic diastolic (congestive) heart failure: Secondary | ICD-10-CM | POA: Diagnosis not present

## 2022-05-22 DIAGNOSIS — E1122 Type 2 diabetes mellitus with diabetic chronic kidney disease: Secondary | ICD-10-CM | POA: Diagnosis not present

## 2022-05-22 DIAGNOSIS — E871 Hypo-osmolality and hyponatremia: Secondary | ICD-10-CM | POA: Diagnosis not present

## 2022-05-22 DIAGNOSIS — N170 Acute kidney failure with tubular necrosis: Secondary | ICD-10-CM | POA: Diagnosis not present

## 2022-05-27 DIAGNOSIS — E1165 Type 2 diabetes mellitus with hyperglycemia: Secondary | ICD-10-CM | POA: Diagnosis not present

## 2022-06-03 DIAGNOSIS — Z933 Colostomy status: Secondary | ICD-10-CM | POA: Diagnosis not present

## 2022-06-12 DIAGNOSIS — E1122 Type 2 diabetes mellitus with diabetic chronic kidney disease: Secondary | ICD-10-CM | POA: Diagnosis not present

## 2022-06-12 DIAGNOSIS — I10 Essential (primary) hypertension: Secondary | ICD-10-CM | POA: Diagnosis not present

## 2022-06-16 DIAGNOSIS — G4733 Obstructive sleep apnea (adult) (pediatric): Secondary | ICD-10-CM | POA: Diagnosis not present

## 2022-06-27 DIAGNOSIS — E1165 Type 2 diabetes mellitus with hyperglycemia: Secondary | ICD-10-CM | POA: Diagnosis not present

## 2022-06-29 ENCOUNTER — Encounter: Payer: Self-pay | Admitting: Radiology

## 2022-07-01 DIAGNOSIS — Z933 Colostomy status: Secondary | ICD-10-CM | POA: Diagnosis not present

## 2022-07-11 DIAGNOSIS — I10 Essential (primary) hypertension: Secondary | ICD-10-CM | POA: Diagnosis not present

## 2022-07-11 DIAGNOSIS — E1122 Type 2 diabetes mellitus with diabetic chronic kidney disease: Secondary | ICD-10-CM | POA: Diagnosis not present

## 2022-07-12 DIAGNOSIS — I5032 Chronic diastolic (congestive) heart failure: Secondary | ICD-10-CM | POA: Diagnosis not present

## 2022-07-12 DIAGNOSIS — E1122 Type 2 diabetes mellitus with diabetic chronic kidney disease: Secondary | ICD-10-CM | POA: Diagnosis not present

## 2022-07-12 DIAGNOSIS — N170 Acute kidney failure with tubular necrosis: Secondary | ICD-10-CM | POA: Diagnosis not present

## 2022-07-12 DIAGNOSIS — N189 Chronic kidney disease, unspecified: Secondary | ICD-10-CM | POA: Diagnosis not present

## 2022-07-12 DIAGNOSIS — E211 Secondary hyperparathyroidism, not elsewhere classified: Secondary | ICD-10-CM | POA: Diagnosis not present

## 2022-07-15 DIAGNOSIS — G4733 Obstructive sleep apnea (adult) (pediatric): Secondary | ICD-10-CM | POA: Diagnosis not present

## 2022-07-17 ENCOUNTER — Encounter (INDEPENDENT_AMBULATORY_CARE_PROVIDER_SITE_OTHER): Payer: Medicare Other | Admitting: Ophthalmology

## 2022-07-19 DIAGNOSIS — D638 Anemia in other chronic diseases classified elsewhere: Secondary | ICD-10-CM | POA: Diagnosis not present

## 2022-07-19 DIAGNOSIS — E1122 Type 2 diabetes mellitus with diabetic chronic kidney disease: Secondary | ICD-10-CM | POA: Diagnosis not present

## 2022-07-19 DIAGNOSIS — I5032 Chronic diastolic (congestive) heart failure: Secondary | ICD-10-CM | POA: Diagnosis not present

## 2022-07-19 DIAGNOSIS — I129 Hypertensive chronic kidney disease with stage 1 through stage 4 chronic kidney disease, or unspecified chronic kidney disease: Secondary | ICD-10-CM | POA: Diagnosis not present

## 2022-07-19 DIAGNOSIS — E211 Secondary hyperparathyroidism, not elsewhere classified: Secondary | ICD-10-CM | POA: Diagnosis not present

## 2022-07-19 DIAGNOSIS — N189 Chronic kidney disease, unspecified: Secondary | ICD-10-CM | POA: Diagnosis not present

## 2022-07-26 DIAGNOSIS — E1165 Type 2 diabetes mellitus with hyperglycemia: Secondary | ICD-10-CM | POA: Diagnosis not present

## 2022-07-28 ENCOUNTER — Ambulatory Visit: Payer: 59 | Admitting: Nurse Practitioner

## 2022-08-02 DIAGNOSIS — Z933 Colostomy status: Secondary | ICD-10-CM | POA: Diagnosis not present

## 2022-08-03 ENCOUNTER — Ambulatory Visit: Payer: 59 | Admitting: Nurse Practitioner

## 2022-08-11 DIAGNOSIS — E1122 Type 2 diabetes mellitus with diabetic chronic kidney disease: Secondary | ICD-10-CM | POA: Diagnosis not present

## 2022-08-11 DIAGNOSIS — I10 Essential (primary) hypertension: Secondary | ICD-10-CM | POA: Diagnosis not present

## 2022-08-15 ENCOUNTER — Ambulatory Visit (INDEPENDENT_AMBULATORY_CARE_PROVIDER_SITE_OTHER): Payer: 59 | Admitting: Orthopedic Surgery

## 2022-08-15 ENCOUNTER — Encounter: Payer: Self-pay | Admitting: Orthopedic Surgery

## 2022-08-15 DIAGNOSIS — M170 Bilateral primary osteoarthritis of knee: Secondary | ICD-10-CM | POA: Diagnosis not present

## 2022-08-15 DIAGNOSIS — G4733 Obstructive sleep apnea (adult) (pediatric): Secondary | ICD-10-CM | POA: Diagnosis not present

## 2022-08-15 NOTE — Progress Notes (Signed)
Return patient Visit  Assessment: Jenna Diaz is a 77 y.o. female with the following: 1. Bilateral primary osteoarthritis of knee  Plan: [name] has advanced degenerative changes in bilateral knees.  Unclear how long the injections are effective.  She has kidney disease, cannot take NSAIDs.  Her most recent A1c is greater than 9.  She is not a good candidate for total knee arthroplasty.  I will continue to offer her knee injections.  These cannot be repeated any other than 3 months.  This was discussed with the family.  She will follow-up as needed.   Procedure note injection Left knee joint   Verbal consent was obtained to inject the left knee joint  Timeout was completed to confirm the site of injection.  The skin was prepped with alcohol and ethyl chloride was sprayed at the injection site.  A 21-gauge needle was used to inject 40 mg of Depo-Medrol and 1% lidocaine (3 cc) into the left knee using an anterolateral approach.  There were no complications. A sterile bandage was applied.   Procedure note injection Right knee joint   Verbal consent was obtained to inject the right knee joint  Timeout was completed to confirm the site of injection.  The skin was prepped with alcohol and ethyl chloride was sprayed at the injection site.  A 21-gauge needle was used to inject 40 mg of Depo-Medrol and 1% lidocaine (3 cc) into the right knee using an anterolateral approach.  There were no complications. A sterile bandage was applied.   Follow-up: Return if symptoms worsen or fail to improve.  Subjective:  Chief Complaint  Patient presents with   Knee Pain    Bil knee pain wants repeat injections.     History of Present Illness: Jenna Diaz is a 77 y.o. female who returns for evaluation of bilateral knee pain.  She has known arthritis in both knees.  She has medical issues, which prevent her from further consideration for knee replacement.  Last injections were  greater than 6 months ago.  She is interested in repeat injections.   Review of Systems: No fevers or chills No numbness or tingling No chest pain No shortness of breath No bowel or bladder dysfunction No GI distress No headaches   Objective: There were no vitals taken for this visit.  Physical Exam:  General: Alert and oriented. and No acute distress.  Seated in wheelchair Gait: Seated in a wheelchair  Bilateral knees without deformity.  No swelling.  Tenderness to palpation along both the medial and lateral joint lines.  She is able to achieve full extension.  She tolerates flexion beyond 90 degrees.    IMAGING: I personally reviewed images previously obtained in clinic  Prior x-rays were reviewed in clinic today.  She has advanced degenerative changes in bilateral knees.   New Medications:  No orders of the defined types were placed in this encounter.     Oliver Barre, MD  08/15/2022 4:29 PM

## 2022-08-15 NOTE — Patient Instructions (Signed)

## 2022-08-18 ENCOUNTER — Telehealth: Payer: Self-pay | Admitting: Nurse Practitioner

## 2022-08-18 NOTE — Telephone Encounter (Signed)
Numerous attempts to contact patient with recall letters. Unable to reach by telephone. with no success.   

## 2022-08-26 DIAGNOSIS — E1165 Type 2 diabetes mellitus with hyperglycemia: Secondary | ICD-10-CM | POA: Diagnosis not present

## 2022-08-31 DIAGNOSIS — I129 Hypertensive chronic kidney disease with stage 1 through stage 4 chronic kidney disease, or unspecified chronic kidney disease: Secondary | ICD-10-CM | POA: Diagnosis not present

## 2022-08-31 DIAGNOSIS — E1122 Type 2 diabetes mellitus with diabetic chronic kidney disease: Secondary | ICD-10-CM | POA: Diagnosis not present

## 2022-08-31 DIAGNOSIS — I5032 Chronic diastolic (congestive) heart failure: Secondary | ICD-10-CM | POA: Diagnosis not present

## 2022-08-31 DIAGNOSIS — N189 Chronic kidney disease, unspecified: Secondary | ICD-10-CM | POA: Diagnosis not present

## 2022-08-31 DIAGNOSIS — E211 Secondary hyperparathyroidism, not elsewhere classified: Secondary | ICD-10-CM | POA: Diagnosis not present

## 2022-09-04 DIAGNOSIS — E7800 Pure hypercholesterolemia, unspecified: Secondary | ICD-10-CM | POA: Diagnosis not present

## 2022-09-04 DIAGNOSIS — E1142 Type 2 diabetes mellitus with diabetic polyneuropathy: Secondary | ICD-10-CM | POA: Diagnosis not present

## 2022-09-04 DIAGNOSIS — N183 Chronic kidney disease, stage 3 unspecified: Secondary | ICD-10-CM | POA: Diagnosis not present

## 2022-09-04 DIAGNOSIS — N184 Chronic kidney disease, stage 4 (severe): Secondary | ICD-10-CM | POA: Diagnosis not present

## 2022-09-04 DIAGNOSIS — E039 Hypothyroidism, unspecified: Secondary | ICD-10-CM | POA: Diagnosis not present

## 2022-09-04 DIAGNOSIS — G629 Polyneuropathy, unspecified: Secondary | ICD-10-CM | POA: Diagnosis not present

## 2022-09-08 DIAGNOSIS — G4733 Obstructive sleep apnea (adult) (pediatric): Secondary | ICD-10-CM | POA: Diagnosis not present

## 2022-09-08 DIAGNOSIS — I129 Hypertensive chronic kidney disease with stage 1 through stage 4 chronic kidney disease, or unspecified chronic kidney disease: Secondary | ICD-10-CM | POA: Diagnosis not present

## 2022-09-08 DIAGNOSIS — N2581 Secondary hyperparathyroidism of renal origin: Secondary | ICD-10-CM | POA: Diagnosis not present

## 2022-09-08 DIAGNOSIS — I5032 Chronic diastolic (congestive) heart failure: Secondary | ICD-10-CM | POA: Diagnosis not present

## 2022-09-10 DIAGNOSIS — I10 Essential (primary) hypertension: Secondary | ICD-10-CM | POA: Diagnosis not present

## 2022-09-10 DIAGNOSIS — E1122 Type 2 diabetes mellitus with diabetic chronic kidney disease: Secondary | ICD-10-CM | POA: Diagnosis not present

## 2022-09-14 DIAGNOSIS — G4733 Obstructive sleep apnea (adult) (pediatric): Secondary | ICD-10-CM | POA: Diagnosis not present

## 2022-09-25 DIAGNOSIS — E1165 Type 2 diabetes mellitus with hyperglycemia: Secondary | ICD-10-CM | POA: Diagnosis not present

## 2022-09-26 ENCOUNTER — Telehealth: Payer: Self-pay | Admitting: Radiology

## 2022-09-26 DIAGNOSIS — M170 Bilateral primary osteoarthritis of knee: Secondary | ICD-10-CM

## 2022-09-26 NOTE — Telephone Encounter (Signed)
Daughter called, LM asking what would be next steps for her, as the knee injection has worn off.  Having pain again.

## 2022-09-27 NOTE — Telephone Encounter (Signed)
Spoke with daughter who states the braces don't stay up well, and it's hard to ice pt due to memory concerns. Will bring pt back in 6 wks for repeat injections, but would like to know if she can have some home health therapy and possibly tramadol for breakthrough pain. Please advise.

## 2022-09-28 MED ORDER — TRAMADOL HCL 50 MG PO TABS: 50.0000 mg | ORAL_TABLET | Freq: Two times a day (BID) | ORAL | 0 refills | Status: DC | PRN

## 2022-09-28 NOTE — Addendum Note (Signed)
Addended by: Baird Kay on: 09/28/2022 11:22 AM   Modules accepted: Orders

## 2022-09-28 NOTE — Addendum Note (Signed)
Addended by: Thane Edu A on: 09/28/2022 01:33 PM   Modules accepted: Orders

## 2022-10-05 DIAGNOSIS — E1122 Type 2 diabetes mellitus with diabetic chronic kidney disease: Secondary | ICD-10-CM | POA: Diagnosis not present

## 2022-10-05 DIAGNOSIS — I7121 Aneurysm of the ascending aorta, without rupture: Secondary | ICD-10-CM | POA: Diagnosis not present

## 2022-10-05 DIAGNOSIS — Z1389 Encounter for screening for other disorder: Secondary | ICD-10-CM | POA: Diagnosis not present

## 2022-10-05 DIAGNOSIS — I10 Essential (primary) hypertension: Secondary | ICD-10-CM | POA: Diagnosis not present

## 2022-10-05 DIAGNOSIS — I2510 Atherosclerotic heart disease of native coronary artery without angina pectoris: Secondary | ICD-10-CM | POA: Diagnosis not present

## 2022-10-05 DIAGNOSIS — E039 Hypothyroidism, unspecified: Secondary | ICD-10-CM | POA: Diagnosis not present

## 2022-10-05 DIAGNOSIS — I129 Hypertensive chronic kidney disease with stage 1 through stage 4 chronic kidney disease, or unspecified chronic kidney disease: Secondary | ICD-10-CM | POA: Diagnosis not present

## 2022-10-05 DIAGNOSIS — E1142 Type 2 diabetes mellitus with diabetic polyneuropathy: Secondary | ICD-10-CM | POA: Diagnosis not present

## 2022-10-05 DIAGNOSIS — Z933 Colostomy status: Secondary | ICD-10-CM | POA: Diagnosis not present

## 2022-10-05 DIAGNOSIS — Z79899 Other long term (current) drug therapy: Secondary | ICD-10-CM | POA: Diagnosis not present

## 2022-10-05 DIAGNOSIS — E1165 Type 2 diabetes mellitus with hyperglycemia: Secondary | ICD-10-CM | POA: Diagnosis not present

## 2022-10-05 DIAGNOSIS — Z23 Encounter for immunization: Secondary | ICD-10-CM | POA: Diagnosis not present

## 2022-10-05 DIAGNOSIS — E1129 Type 2 diabetes mellitus with other diabetic kidney complication: Secondary | ICD-10-CM | POA: Diagnosis not present

## 2022-10-05 DIAGNOSIS — G4733 Obstructive sleep apnea (adult) (pediatric): Secondary | ICD-10-CM | POA: Diagnosis not present

## 2022-10-05 DIAGNOSIS — N189 Chronic kidney disease, unspecified: Secondary | ICD-10-CM | POA: Diagnosis not present

## 2022-10-05 DIAGNOSIS — Z0001 Encounter for general adult medical examination with abnormal findings: Secondary | ICD-10-CM | POA: Diagnosis not present

## 2022-10-13 DIAGNOSIS — N184 Chronic kidney disease, stage 4 (severe): Secondary | ICD-10-CM | POA: Diagnosis not present

## 2022-10-13 DIAGNOSIS — I5032 Chronic diastolic (congestive) heart failure: Secondary | ICD-10-CM | POA: Diagnosis not present

## 2022-10-13 DIAGNOSIS — I129 Hypertensive chronic kidney disease with stage 1 through stage 4 chronic kidney disease, or unspecified chronic kidney disease: Secondary | ICD-10-CM | POA: Diagnosis not present

## 2022-10-13 DIAGNOSIS — N179 Acute kidney failure, unspecified: Secondary | ICD-10-CM | POA: Diagnosis not present

## 2022-10-14 DIAGNOSIS — G4733 Obstructive sleep apnea (adult) (pediatric): Secondary | ICD-10-CM | POA: Diagnosis not present

## 2022-10-15 DIAGNOSIS — G4733 Obstructive sleep apnea (adult) (pediatric): Secondary | ICD-10-CM | POA: Diagnosis not present

## 2022-10-26 DIAGNOSIS — E1165 Type 2 diabetes mellitus with hyperglycemia: Secondary | ICD-10-CM | POA: Diagnosis not present

## 2022-11-04 DIAGNOSIS — I10 Essential (primary) hypertension: Secondary | ICD-10-CM | POA: Diagnosis not present

## 2022-11-04 DIAGNOSIS — E1122 Type 2 diabetes mellitus with diabetic chronic kidney disease: Secondary | ICD-10-CM | POA: Diagnosis not present

## 2022-11-07 DIAGNOSIS — Z933 Colostomy status: Secondary | ICD-10-CM | POA: Diagnosis not present

## 2022-11-09 DIAGNOSIS — N184 Chronic kidney disease, stage 4 (severe): Secondary | ICD-10-CM | POA: Diagnosis not present

## 2022-11-09 DIAGNOSIS — N189 Chronic kidney disease, unspecified: Secondary | ICD-10-CM | POA: Diagnosis not present

## 2022-11-09 DIAGNOSIS — I5032 Chronic diastolic (congestive) heart failure: Secondary | ICD-10-CM | POA: Diagnosis not present

## 2022-11-09 DIAGNOSIS — G4733 Obstructive sleep apnea (adult) (pediatric): Secondary | ICD-10-CM | POA: Diagnosis not present

## 2022-11-09 DIAGNOSIS — E1122 Type 2 diabetes mellitus with diabetic chronic kidney disease: Secondary | ICD-10-CM | POA: Diagnosis not present

## 2022-11-14 DIAGNOSIS — G4733 Obstructive sleep apnea (adult) (pediatric): Secondary | ICD-10-CM | POA: Diagnosis not present

## 2022-11-25 DIAGNOSIS — E1165 Type 2 diabetes mellitus with hyperglycemia: Secondary | ICD-10-CM | POA: Diagnosis not present

## 2022-12-02 DIAGNOSIS — Z933 Colostomy status: Secondary | ICD-10-CM | POA: Diagnosis not present

## 2022-12-05 DIAGNOSIS — I10 Essential (primary) hypertension: Secondary | ICD-10-CM | POA: Diagnosis not present

## 2022-12-05 DIAGNOSIS — E1122 Type 2 diabetes mellitus with diabetic chronic kidney disease: Secondary | ICD-10-CM | POA: Diagnosis not present

## 2022-12-11 DIAGNOSIS — N184 Chronic kidney disease, stage 4 (severe): Secondary | ICD-10-CM | POA: Diagnosis not present

## 2022-12-11 DIAGNOSIS — E1122 Type 2 diabetes mellitus with diabetic chronic kidney disease: Secondary | ICD-10-CM | POA: Diagnosis not present

## 2022-12-11 DIAGNOSIS — Z0001 Encounter for general adult medical examination with abnormal findings: Secondary | ICD-10-CM | POA: Diagnosis not present

## 2022-12-11 DIAGNOSIS — I129 Hypertensive chronic kidney disease with stage 1 through stage 4 chronic kidney disease, or unspecified chronic kidney disease: Secondary | ICD-10-CM | POA: Diagnosis not present

## 2022-12-11 DIAGNOSIS — D631 Anemia in chronic kidney disease: Secondary | ICD-10-CM | POA: Diagnosis not present

## 2022-12-11 DIAGNOSIS — N390 Urinary tract infection, site not specified: Secondary | ICD-10-CM | POA: Diagnosis not present

## 2022-12-11 DIAGNOSIS — N189 Chronic kidney disease, unspecified: Secondary | ICD-10-CM | POA: Diagnosis not present

## 2022-12-15 DIAGNOSIS — G4733 Obstructive sleep apnea (adult) (pediatric): Secondary | ICD-10-CM | POA: Diagnosis not present

## 2022-12-26 DIAGNOSIS — E1165 Type 2 diabetes mellitus with hyperglycemia: Secondary | ICD-10-CM | POA: Diagnosis not present

## 2023-01-02 DIAGNOSIS — Z933 Colostomy status: Secondary | ICD-10-CM | POA: Diagnosis not present

## 2023-01-05 DIAGNOSIS — E039 Hypothyroidism, unspecified: Secondary | ICD-10-CM | POA: Diagnosis not present

## 2023-01-05 DIAGNOSIS — E1142 Type 2 diabetes mellitus with diabetic polyneuropathy: Secondary | ICD-10-CM | POA: Diagnosis not present

## 2023-01-05 DIAGNOSIS — G629 Polyneuropathy, unspecified: Secondary | ICD-10-CM | POA: Diagnosis not present

## 2023-01-05 DIAGNOSIS — E7800 Pure hypercholesterolemia, unspecified: Secondary | ICD-10-CM | POA: Diagnosis not present

## 2023-01-05 DIAGNOSIS — N184 Chronic kidney disease, stage 4 (severe): Secondary | ICD-10-CM | POA: Diagnosis not present

## 2023-01-05 DIAGNOSIS — N183 Chronic kidney disease, stage 3 unspecified: Secondary | ICD-10-CM | POA: Diagnosis not present

## 2023-01-11 DIAGNOSIS — I10 Essential (primary) hypertension: Secondary | ICD-10-CM | POA: Diagnosis not present

## 2023-01-11 DIAGNOSIS — E1122 Type 2 diabetes mellitus with diabetic chronic kidney disease: Secondary | ICD-10-CM | POA: Diagnosis not present

## 2023-01-15 DIAGNOSIS — G4733 Obstructive sleep apnea (adult) (pediatric): Secondary | ICD-10-CM | POA: Diagnosis not present

## 2023-01-19 DIAGNOSIS — N189 Chronic kidney disease, unspecified: Secondary | ICD-10-CM | POA: Diagnosis not present

## 2023-01-19 DIAGNOSIS — N184 Chronic kidney disease, stage 4 (severe): Secondary | ICD-10-CM | POA: Diagnosis not present

## 2023-01-19 DIAGNOSIS — Z23 Encounter for immunization: Secondary | ICD-10-CM | POA: Diagnosis not present

## 2023-01-19 DIAGNOSIS — N179 Acute kidney failure, unspecified: Secondary | ICD-10-CM | POA: Diagnosis not present

## 2023-01-19 DIAGNOSIS — E1122 Type 2 diabetes mellitus with diabetic chronic kidney disease: Secondary | ICD-10-CM | POA: Diagnosis not present

## 2023-01-19 DIAGNOSIS — I2510 Atherosclerotic heart disease of native coronary artery without angina pectoris: Secondary | ICD-10-CM | POA: Diagnosis not present

## 2023-01-26 DIAGNOSIS — E1165 Type 2 diabetes mellitus with hyperglycemia: Secondary | ICD-10-CM | POA: Diagnosis not present

## 2023-01-29 DIAGNOSIS — I129 Hypertensive chronic kidney disease with stage 1 through stage 4 chronic kidney disease, or unspecified chronic kidney disease: Secondary | ICD-10-CM | POA: Diagnosis not present

## 2023-01-29 DIAGNOSIS — N2581 Secondary hyperparathyroidism of renal origin: Secondary | ICD-10-CM | POA: Diagnosis not present

## 2023-01-29 DIAGNOSIS — N179 Acute kidney failure, unspecified: Secondary | ICD-10-CM | POA: Diagnosis not present

## 2023-01-29 DIAGNOSIS — G4733 Obstructive sleep apnea (adult) (pediatric): Secondary | ICD-10-CM | POA: Diagnosis not present

## 2023-02-01 DIAGNOSIS — Z933 Colostomy status: Secondary | ICD-10-CM | POA: Diagnosis not present

## 2023-02-10 DIAGNOSIS — I10 Essential (primary) hypertension: Secondary | ICD-10-CM | POA: Diagnosis not present

## 2023-02-10 DIAGNOSIS — E1122 Type 2 diabetes mellitus with diabetic chronic kidney disease: Secondary | ICD-10-CM | POA: Diagnosis not present

## 2023-02-14 DIAGNOSIS — G4733 Obstructive sleep apnea (adult) (pediatric): Secondary | ICD-10-CM | POA: Diagnosis not present

## 2023-02-25 DIAGNOSIS — E1165 Type 2 diabetes mellitus with hyperglycemia: Secondary | ICD-10-CM | POA: Diagnosis not present

## 2023-03-13 DIAGNOSIS — I10 Essential (primary) hypertension: Secondary | ICD-10-CM | POA: Diagnosis not present

## 2023-03-13 DIAGNOSIS — E1122 Type 2 diabetes mellitus with diabetic chronic kidney disease: Secondary | ICD-10-CM | POA: Diagnosis not present

## 2023-03-17 DIAGNOSIS — G4733 Obstructive sleep apnea (adult) (pediatric): Secondary | ICD-10-CM | POA: Diagnosis not present

## 2023-03-28 DIAGNOSIS — E1165 Type 2 diabetes mellitus with hyperglycemia: Secondary | ICD-10-CM | POA: Diagnosis not present

## 2023-04-04 DIAGNOSIS — E1142 Type 2 diabetes mellitus with diabetic polyneuropathy: Secondary | ICD-10-CM | POA: Diagnosis not present

## 2023-04-04 DIAGNOSIS — G4733 Obstructive sleep apnea (adult) (pediatric): Secondary | ICD-10-CM | POA: Diagnosis not present

## 2023-04-04 DIAGNOSIS — I10 Essential (primary) hypertension: Secondary | ICD-10-CM | POA: Diagnosis not present

## 2023-04-04 DIAGNOSIS — E1122 Type 2 diabetes mellitus with diabetic chronic kidney disease: Secondary | ICD-10-CM | POA: Diagnosis not present

## 2023-04-04 DIAGNOSIS — E039 Hypothyroidism, unspecified: Secondary | ICD-10-CM | POA: Diagnosis not present

## 2023-04-06 DIAGNOSIS — N183 Chronic kidney disease, stage 3 unspecified: Secondary | ICD-10-CM | POA: Diagnosis not present

## 2023-04-06 DIAGNOSIS — E039 Hypothyroidism, unspecified: Secondary | ICD-10-CM | POA: Diagnosis not present

## 2023-04-06 DIAGNOSIS — E1142 Type 2 diabetes mellitus with diabetic polyneuropathy: Secondary | ICD-10-CM | POA: Diagnosis not present

## 2023-04-06 DIAGNOSIS — E7800 Pure hypercholesterolemia, unspecified: Secondary | ICD-10-CM | POA: Diagnosis not present

## 2023-04-06 DIAGNOSIS — G629 Polyneuropathy, unspecified: Secondary | ICD-10-CM | POA: Diagnosis not present

## 2023-04-06 DIAGNOSIS — N184 Chronic kidney disease, stage 4 (severe): Secondary | ICD-10-CM | POA: Diagnosis not present

## 2023-04-24 DIAGNOSIS — D631 Anemia in chronic kidney disease: Secondary | ICD-10-CM | POA: Diagnosis not present

## 2023-04-24 DIAGNOSIS — N189 Chronic kidney disease, unspecified: Secondary | ICD-10-CM | POA: Diagnosis not present

## 2023-04-24 DIAGNOSIS — E211 Secondary hyperparathyroidism, not elsewhere classified: Secondary | ICD-10-CM | POA: Diagnosis not present

## 2023-04-24 DIAGNOSIS — R809 Proteinuria, unspecified: Secondary | ICD-10-CM | POA: Diagnosis not present

## 2023-04-27 DIAGNOSIS — E1165 Type 2 diabetes mellitus with hyperglycemia: Secondary | ICD-10-CM | POA: Diagnosis not present

## 2023-04-27 DIAGNOSIS — I129 Hypertensive chronic kidney disease with stage 1 through stage 4 chronic kidney disease, or unspecified chronic kidney disease: Secondary | ICD-10-CM | POA: Diagnosis not present

## 2023-04-27 DIAGNOSIS — G4733 Obstructive sleep apnea (adult) (pediatric): Secondary | ICD-10-CM | POA: Diagnosis not present

## 2023-04-27 DIAGNOSIS — N2581 Secondary hyperparathyroidism of renal origin: Secondary | ICD-10-CM | POA: Diagnosis not present

## 2023-04-27 DIAGNOSIS — I5032 Chronic diastolic (congestive) heart failure: Secondary | ICD-10-CM | POA: Diagnosis not present

## 2023-05-05 DIAGNOSIS — I10 Essential (primary) hypertension: Secondary | ICD-10-CM | POA: Diagnosis not present

## 2023-05-05 DIAGNOSIS — E1122 Type 2 diabetes mellitus with diabetic chronic kidney disease: Secondary | ICD-10-CM | POA: Diagnosis not present

## 2023-05-28 DIAGNOSIS — E1165 Type 2 diabetes mellitus with hyperglycemia: Secondary | ICD-10-CM | POA: Diagnosis not present

## 2023-06-04 DIAGNOSIS — Z933 Colostomy status: Secondary | ICD-10-CM | POA: Diagnosis not present

## 2023-06-05 DIAGNOSIS — I10 Essential (primary) hypertension: Secondary | ICD-10-CM | POA: Diagnosis not present

## 2023-06-05 DIAGNOSIS — E1122 Type 2 diabetes mellitus with diabetic chronic kidney disease: Secondary | ICD-10-CM | POA: Diagnosis not present

## 2023-06-28 DIAGNOSIS — E1165 Type 2 diabetes mellitus with hyperglycemia: Secondary | ICD-10-CM | POA: Diagnosis not present

## 2023-07-03 DIAGNOSIS — I10 Essential (primary) hypertension: Secondary | ICD-10-CM | POA: Diagnosis not present

## 2023-07-03 DIAGNOSIS — E1122 Type 2 diabetes mellitus with diabetic chronic kidney disease: Secondary | ICD-10-CM | POA: Diagnosis not present

## 2023-08-03 DIAGNOSIS — I10 Essential (primary) hypertension: Secondary | ICD-10-CM | POA: Diagnosis not present

## 2023-08-03 DIAGNOSIS — E1122 Type 2 diabetes mellitus with diabetic chronic kidney disease: Secondary | ICD-10-CM | POA: Diagnosis not present

## 2023-08-13 DIAGNOSIS — Z9641 Presence of insulin pump (external) (internal): Secondary | ICD-10-CM | POA: Diagnosis not present

## 2023-08-13 DIAGNOSIS — G629 Polyneuropathy, unspecified: Secondary | ICD-10-CM | POA: Diagnosis not present

## 2023-08-13 DIAGNOSIS — E039 Hypothyroidism, unspecified: Secondary | ICD-10-CM | POA: Diagnosis not present

## 2023-08-13 DIAGNOSIS — N184 Chronic kidney disease, stage 4 (severe): Secondary | ICD-10-CM | POA: Diagnosis not present

## 2023-08-13 DIAGNOSIS — D631 Anemia in chronic kidney disease: Secondary | ICD-10-CM | POA: Diagnosis not present

## 2023-08-13 DIAGNOSIS — R809 Proteinuria, unspecified: Secondary | ICD-10-CM | POA: Diagnosis not present

## 2023-08-13 DIAGNOSIS — N189 Chronic kidney disease, unspecified: Secondary | ICD-10-CM | POA: Diagnosis not present

## 2023-08-13 DIAGNOSIS — E1142 Type 2 diabetes mellitus with diabetic polyneuropathy: Secondary | ICD-10-CM | POA: Diagnosis not present

## 2023-08-13 DIAGNOSIS — E7800 Pure hypercholesterolemia, unspecified: Secondary | ICD-10-CM | POA: Diagnosis not present

## 2023-08-13 DIAGNOSIS — N183 Chronic kidney disease, stage 3 unspecified: Secondary | ICD-10-CM | POA: Diagnosis not present

## 2023-08-15 DIAGNOSIS — N184 Chronic kidney disease, stage 4 (severe): Secondary | ICD-10-CM | POA: Diagnosis not present

## 2023-08-15 DIAGNOSIS — E1129 Type 2 diabetes mellitus with other diabetic kidney complication: Secondary | ICD-10-CM | POA: Diagnosis not present

## 2023-08-15 DIAGNOSIS — I701 Atherosclerosis of renal artery: Secondary | ICD-10-CM | POA: Diagnosis not present

## 2023-08-15 DIAGNOSIS — E1122 Type 2 diabetes mellitus with diabetic chronic kidney disease: Secondary | ICD-10-CM | POA: Diagnosis not present

## 2023-09-02 DIAGNOSIS — E1122 Type 2 diabetes mellitus with diabetic chronic kidney disease: Secondary | ICD-10-CM | POA: Diagnosis not present

## 2023-09-02 DIAGNOSIS — I10 Essential (primary) hypertension: Secondary | ICD-10-CM | POA: Diagnosis not present

## 2023-09-17 DIAGNOSIS — Z1321 Encounter for screening for nutritional disorder: Secondary | ICD-10-CM | POA: Diagnosis not present

## 2023-09-17 DIAGNOSIS — N399 Disorder of urinary system, unspecified: Secondary | ICD-10-CM | POA: Diagnosis not present

## 2023-09-17 DIAGNOSIS — Z0001 Encounter for general adult medical examination with abnormal findings: Secondary | ICD-10-CM | POA: Diagnosis not present

## 2023-09-17 DIAGNOSIS — E1122 Type 2 diabetes mellitus with diabetic chronic kidney disease: Secondary | ICD-10-CM | POA: Diagnosis not present

## 2023-09-17 DIAGNOSIS — E039 Hypothyroidism, unspecified: Secondary | ICD-10-CM | POA: Diagnosis not present

## 2023-10-05 DIAGNOSIS — E1122 Type 2 diabetes mellitus with diabetic chronic kidney disease: Secondary | ICD-10-CM | POA: Diagnosis not present

## 2023-10-05 DIAGNOSIS — Z1389 Encounter for screening for other disorder: Secondary | ICD-10-CM | POA: Diagnosis not present

## 2023-10-05 DIAGNOSIS — Z0001 Encounter for general adult medical examination with abnormal findings: Secondary | ICD-10-CM | POA: Diagnosis not present

## 2023-10-05 DIAGNOSIS — E039 Hypothyroidism, unspecified: Secondary | ICD-10-CM | POA: Diagnosis not present

## 2023-10-05 DIAGNOSIS — E1142 Type 2 diabetes mellitus with diabetic polyneuropathy: Secondary | ICD-10-CM | POA: Diagnosis not present

## 2023-10-05 DIAGNOSIS — I10 Essential (primary) hypertension: Secondary | ICD-10-CM | POA: Diagnosis not present

## 2023-10-05 DIAGNOSIS — I7121 Aneurysm of the ascending aorta, without rupture: Secondary | ICD-10-CM | POA: Diagnosis not present

## 2023-10-05 DIAGNOSIS — G4733 Obstructive sleep apnea (adult) (pediatric): Secondary | ICD-10-CM | POA: Diagnosis not present

## 2023-10-11 DIAGNOSIS — G4733 Obstructive sleep apnea (adult) (pediatric): Secondary | ICD-10-CM | POA: Diagnosis not present

## 2023-10-15 DIAGNOSIS — E1142 Type 2 diabetes mellitus with diabetic polyneuropathy: Secondary | ICD-10-CM | POA: Diagnosis not present

## 2023-10-15 DIAGNOSIS — E1165 Type 2 diabetes mellitus with hyperglycemia: Secondary | ICD-10-CM | POA: Diagnosis not present

## 2023-11-04 DIAGNOSIS — E1122 Type 2 diabetes mellitus with diabetic chronic kidney disease: Secondary | ICD-10-CM | POA: Diagnosis not present

## 2023-11-04 DIAGNOSIS — I10 Essential (primary) hypertension: Secondary | ICD-10-CM | POA: Diagnosis not present

## 2023-11-10 DIAGNOSIS — G4733 Obstructive sleep apnea (adult) (pediatric): Secondary | ICD-10-CM | POA: Diagnosis not present

## 2023-11-14 DIAGNOSIS — E1142 Type 2 diabetes mellitus with diabetic polyneuropathy: Secondary | ICD-10-CM | POA: Diagnosis not present

## 2023-11-14 DIAGNOSIS — E1165 Type 2 diabetes mellitus with hyperglycemia: Secondary | ICD-10-CM | POA: Diagnosis not present

## 2023-12-05 DIAGNOSIS — E1122 Type 2 diabetes mellitus with diabetic chronic kidney disease: Secondary | ICD-10-CM | POA: Diagnosis not present

## 2023-12-05 DIAGNOSIS — I10 Essential (primary) hypertension: Secondary | ICD-10-CM | POA: Diagnosis not present

## 2023-12-11 DIAGNOSIS — G4733 Obstructive sleep apnea (adult) (pediatric): Secondary | ICD-10-CM | POA: Diagnosis not present

## 2023-12-14 DIAGNOSIS — I10 Essential (primary) hypertension: Secondary | ICD-10-CM | POA: Diagnosis not present

## 2023-12-14 DIAGNOSIS — E1142 Type 2 diabetes mellitus with diabetic polyneuropathy: Secondary | ICD-10-CM | POA: Diagnosis not present

## 2023-12-14 DIAGNOSIS — R112 Nausea with vomiting, unspecified: Secondary | ICD-10-CM | POA: Diagnosis not present

## 2023-12-15 DIAGNOSIS — E1142 Type 2 diabetes mellitus with diabetic polyneuropathy: Secondary | ICD-10-CM | POA: Diagnosis not present

## 2023-12-15 DIAGNOSIS — E1165 Type 2 diabetes mellitus with hyperglycemia: Secondary | ICD-10-CM | POA: Diagnosis not present

## 2023-12-18 ENCOUNTER — Encounter: Payer: Self-pay | Admitting: Gastroenterology

## 2023-12-18 ENCOUNTER — Encounter: Payer: Self-pay | Admitting: Internal Medicine

## 2024-01-14 DIAGNOSIS — I10 Essential (primary) hypertension: Secondary | ICD-10-CM | POA: Diagnosis not present

## 2024-01-14 DIAGNOSIS — E1122 Type 2 diabetes mellitus with diabetic chronic kidney disease: Secondary | ICD-10-CM | POA: Diagnosis not present

## 2024-01-15 DIAGNOSIS — E1165 Type 2 diabetes mellitus with hyperglycemia: Secondary | ICD-10-CM | POA: Diagnosis not present

## 2024-01-15 DIAGNOSIS — E1142 Type 2 diabetes mellitus with diabetic polyneuropathy: Secondary | ICD-10-CM | POA: Diagnosis not present

## 2024-01-25 DIAGNOSIS — E039 Hypothyroidism, unspecified: Secondary | ICD-10-CM | POA: Diagnosis not present

## 2024-01-25 DIAGNOSIS — Z9641 Presence of insulin pump (external) (internal): Secondary | ICD-10-CM | POA: Diagnosis not present

## 2024-01-25 DIAGNOSIS — N183 Chronic kidney disease, stage 3 unspecified: Secondary | ICD-10-CM | POA: Diagnosis not present

## 2024-01-25 DIAGNOSIS — G629 Polyneuropathy, unspecified: Secondary | ICD-10-CM | POA: Diagnosis not present

## 2024-01-25 DIAGNOSIS — N184 Chronic kidney disease, stage 4 (severe): Secondary | ICD-10-CM | POA: Diagnosis not present

## 2024-01-25 DIAGNOSIS — E7800 Pure hypercholesterolemia, unspecified: Secondary | ICD-10-CM | POA: Diagnosis not present

## 2024-01-25 DIAGNOSIS — E1142 Type 2 diabetes mellitus with diabetic polyneuropathy: Secondary | ICD-10-CM | POA: Diagnosis not present

## 2024-02-04 ENCOUNTER — Encounter: Payer: Self-pay | Admitting: *Deleted

## 2024-02-04 NOTE — Progress Notes (Signed)
 Jenna Diaz                                          MRN: [id number]   02/04/2024   The [facility] Quality Team Specialist reviewed this patient medical record for the purposes of chart review for care gap closure. The following were reviewed: chart review for care gap closure-controlling blood pressure and kidney health evaluation for diabetes:eGFR  and uACR.    [facility] Quality Team

## 2024-02-06 ENCOUNTER — Ambulatory Visit: Admitting: Gastroenterology

## 2024-02-06 ENCOUNTER — Telehealth: Payer: Self-pay | Admitting: *Deleted

## 2024-02-06 ENCOUNTER — Encounter: Payer: Self-pay | Admitting: Gastroenterology

## 2024-02-06 VITALS — BP 124/70 | HR 78 | Temp 98.1°F | Ht 67.0 in | Wt 234.0 lb

## 2024-02-06 DIAGNOSIS — R109 Unspecified abdominal pain: Secondary | ICD-10-CM | POA: Diagnosis not present

## 2024-02-06 DIAGNOSIS — Z9889 Other specified postprocedural states: Secondary | ICD-10-CM | POA: Diagnosis not present

## 2024-02-06 DIAGNOSIS — Z9641 Presence of insulin pump (external) (internal): Secondary | ICD-10-CM

## 2024-02-06 DIAGNOSIS — R1031 Right lower quadrant pain: Secondary | ICD-10-CM

## 2024-02-06 DIAGNOSIS — R110 Nausea: Secondary | ICD-10-CM | POA: Diagnosis not present

## 2024-02-06 DIAGNOSIS — E118 Type 2 diabetes mellitus with unspecified complications: Secondary | ICD-10-CM | POA: Diagnosis not present

## 2024-02-06 DIAGNOSIS — E119 Type 2 diabetes mellitus without complications: Secondary | ICD-10-CM | POA: Diagnosis not present

## 2024-02-06 DIAGNOSIS — D631 Anemia in chronic kidney disease: Secondary | ICD-10-CM | POA: Diagnosis not present

## 2024-02-06 DIAGNOSIS — R809 Proteinuria, unspecified: Secondary | ICD-10-CM | POA: Diagnosis not present

## 2024-02-06 DIAGNOSIS — I10 Essential (primary) hypertension: Secondary | ICD-10-CM | POA: Diagnosis not present

## 2024-02-06 DIAGNOSIS — N189 Chronic kidney disease, unspecified: Secondary | ICD-10-CM | POA: Diagnosis not present

## 2024-02-06 NOTE — Progress Notes (Signed)
 GI Office Note    Referring Provider: Carlette Benita Area* Primary Care Physician:  Carlette Benita Area, MD  Primary Gastroenterologist: Ozell Hollingshead, MD   Chief Complaint   Chief Complaint  Patient presents with   Nausea    Having issues off and on of nausea/vomiting. Also has a colostomy bag that has not been monitored in a long while.      History of Present Illness   Jenna Diaz is a 78 y.o. female presenting today for N/V, GERD at the request of Dr. Carlette. Previously followed by [facility], last seen in 2019.  H/o gastric ulcer in 2012/2013 (healing at that time), none seen on 2017 EGD. History of esophageal dilation in 2017, normal appearing esophagus. 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 She has had dor fundoplication for achalasia.   Presents with daughter today as patient has memory issues, on Aricept. She lives with her 49 year old sister. Recently moving her medication care to Dr. Carlette, where daughter works, so that she can be managed more closely. She has poorly controlled diabetes, now on insulin  pump, monitored and controlled by another daughter. She is followed by endocrinology.   Discussed the use of AI scribe software for clinical note transcription with the patient, who gave verbal consent to proceed.   She has a history of gastrointestinal issues that led to a complete colectomy and ileostomy before 2008, exact dates are not known. Initially had partial colectomy at [facility]. I was able to find surgical pathology from 2005, partial colectomy for megacolon. She went on to complete total colectomy done at [facility] per daughter. Reportedly patient was unable to eat/keep any food down but also with chronic constipation. After surgery, vomiting stopped. She has not had any recent follow up for her ileostomy. CT in 2023 showed inspissated and calcified fecal material in the remnant rectum.    Recently,  she has been experiencing nausea and stomach discomfort, particularly in the mornings. Her sister reports daily complaints of stomach upset first thing in the morning. Patient unable to provide details due to her memory issues. She has been prescribed Zofran  for nausea, which she takes as needed, though it is not used frequently. No vomiting is reported, but there has been some weight loss, though not significant. She has a history of ulcers that were difficult to heal remotely, but no recent complaints of ulcer symptoms. She denies heartburn on PPI. No dysphagia.   She manages her ileostomy by emptying it regularly, every time she urinates. Does not seem to have excessive output. Stool is liquid, green. No melena, brbpr. Occasionally, the stoma gets irritated, but it resolves with care.  She has a history of diabetes, which is poorly controlled, with a recent A1c of 11. She uses an insulin  pump managed by her dgt.  Her family history includes ovarian cancer in her sister and possible esophageal cancer in her father. She does not smoke or drink alcohol.     Has appt for blood work today with nephrology.   Prior Data   09/18/2023 labs: White blood cell count 8.5, hemoglobin 12.1, platelets 198, glucose 283, creatinine 1.99, sodium 148, potassium 3.9, albumin 3.8, total bilirubin 0.8, alk phos 99, AST 25, ALT 18, A1c 11.1, TSH 0.684  EGD 12/2015: -normal esophagus -single gastric polyp resected, hyperplastic -nodular mucosa in prepyloric region of stomach -no h.pylori  Ileoscopy via stoma 11/2010: -S/p total colectomy, no residual colon remains. -normal TI  for 25cm   Medications   Current Outpatient Medications  Medication Sig Dispense Refill   acetaminophen  (TYLENOL ) 500 MG tablet Take 1,000 mg by mouth every 6 (six) hours as needed for mild pain.      amLODipine  (NORVASC ) 2.5 MG tablet Take 2.5 mg by mouth daily.     aspirin  EC 81 MG tablet Take 81 mg by mouth daily.      calcitRIOL  (ROCALTROL) 0.25 MCG capsule Take 0.25 mcg by mouth 2 (two) times a week.     Continuous Glucose Sensor (DEXCOM G7 SENSOR) MISC SMARTSIG:1 Topical Every 10 Days     donepezil (ARICEPT) 10 MG tablet Take 10 mg by mouth daily.     escitalopram (LEXAPRO) 20 MG tablet Take 20 mg by mouth daily.   2   Ferrous Sulfate (FERROUSUL PO) Take 34 mg by mouth in the morning, at noon, in the evening, and at bedtime.     fluticasone  (FLONASE ) 50 MCG/ACT nasal spray Place 2 sprays into both nostrils daily. 1 g 2   furosemide (LASIX) 40 MG tablet Take 40 mg by mouth daily. For fluid retention     gabapentin  (NEURONTIN ) 300 MG capsule Take 300 mg by mouth 3 (three) times daily.      HUMALOG 100 UNIT/ML injection 150u/day Injection via pump     hydrALAZINE (APRESOLINE) 50 MG tablet Take 50 mg by mouth 2 (two) times daily.     Insulin  Disposable Pump (OMNIPOD 5 DEXG7G6 PODS GEN 5) MISC SMARTSIG:SUB-Q Every Other Day     levothyroxine  (SYNTHROID , LEVOTHROID) 150 MCG tablet Take 150 mcg by mouth daily.       loratadine  (CLARITIN ) 10 MG tablet Take by mouth.     Menthol-Methyl Salicylate (MUSCLE RUB) 10-15 % CREA Apply 1 application topically as needed (for pain in hands).     Multiple Vitamin (MULTIVITAMIN WITH MINERALS) TABS tablet Take 1 tablet by mouth daily.     ondansetron  (ZOFRAN -ODT) 4 MG disintegrating tablet Take 4 mg by mouth every 8 (eight) hours.     pantoprazole  (PROTONIX ) 40 MG tablet Take 40 mg by mouth 2 (two) times daily.      Potassium Chloride ER 20 MEQ TBCR Take 1 tablet by mouth daily.     rosuvastatin  (CRESTOR ) 10 MG tablet Take 10 mg by mouth at bedtime.     TRADJENTA 5 MG TABS tablet Take 5 mg by mouth daily.     Current Facility-Administered Medications  Medication Dose Route Frequency Provider Last Rate Last Admin   0.9 %  sodium chloride  infusion  500 mL Intravenous Continuous Abran Norleen SAILOR, MD        Allergies   Allergies as of 02/06/2024   (No Known Allergies)    Past Medical  History   Past Medical History:  Diagnosis Date   Anemia    Anxiety    Bowel obstruction (HCC) 1997   Bursitis    CAD (coronary artery disease)    A. 11/2010 - Cath: 60% LAD, 50% RCA ;  B. s/p Promus DES to LAD (by FFR 12/12);  RCA ok by FFR 12/12   Carpal tunnel syndrome    Cholelithiasis    Chronic kidney disease    questionable   Diabetes mellitus    Esophageal stricture    Essential hypertension    Ganglion cyst    Gastric ulcer    Gastroparesis    GERD (gastroesophageal reflux disease)    PUD with UGI bleed in 8/12 dx by  EGD   Hyperlipemia    Hypertension    Hypothyroidism    Morbid obesity (HCC)    Osteoarthritis    Peripheral vascular disease    Sleep apnea    CPAP, pt does not know settings    Past Surgical History   Past Surgical History:  Procedure Laterality Date   ABDOMINAL HYSTERECTOMY  1970's   CATARACT EXTRACTION W/PHACO Left 02/07/2016   Procedure: CATARACT EXTRACTION PHACO AND INTRAOCULAR LENS PLACEMENT LEFT EYE;  Surgeon: Lynwood Hermann, MD;  Location: [facility];  Service: Ophthalmology;  Laterality: Left;  CDE: 4.12   CATARACT EXTRACTION W/PHACO Right 02/24/2016   Procedure: CATARACT EXTRACTION PHACO AND INTRAOCULAR LENS PLACEMENT (IOC) CDE 4.37;  Surgeon: Lynwood Hermann, MD;  Location: [facility];  Service: Ophthalmology;  Laterality: Right;  right   COLON RESECTION  2005   partial colectomy for megacolon   COLON SURGERY     total colectomy, ileostomy, remnant rectum   DILATION AND CURETTAGE OF UTERUS     before hysterectomy   dor fundoplication     achalasia   ESOPHAGOGASTRODUODENOSCOPY N/A 01/18/2016   Procedure: ESOPHAGOGASTRODUODENOSCOPY (EGD);  Surgeon: Norleen LOISE Kiang, MD;  Location: [facility];  Service: Endoscopy;  Laterality: N/A;   GASTROSTOMY W/ FEEDING TUBE  1990's   later removed   HAND SURGERY     cyst on left hand   LEFT AND RIGHT HEART CATHETERIZATION WITH CORONARY ANGIOGRAM N/A 04/28/2011   Procedure: LEFT AND RIGHT HEART  CATHETERIZATION WITH CORONARY ANGIOGRAM;  Surgeon: Ozell Fell, MD;  Location: [facility] C[facility];  Service: Cardiovascular;  Laterality: N/A;   UPPER GASTROINTESTINAL ENDOSCOPY      Past Family History   Family History  Problem Relation Age of Onset   Heart disease Mother        MI   Heart disease Maternal Grandmother    Heart disease Paternal Grandmother    Ovarian cancer Sister    Diabetes Sister        x3   Heart disease Brother    Esophageal cancer Father    Colon cancer Neg Hx    Stomach cancer Neg Hx     Past Social History   Social History   Socioeconomic History   Marital status: Single    Spouse name: Not on file   Number of children: 3   Years of education: Not on file   Highest education level: Not on file  Occupational History   Occupation: disability  Tobacco Use   Smoking status: Never   Smokeless tobacco: Never  Vaping Use   Vaping status: Never Used  Substance and Sexual Activity   Alcohol use: No   Drug use: No   Sexual activity: Never  Other Topics Concern   Not on file  Social History Narrative   Not on file   Social Drivers of Health   Financial Resource Strain: Not on file  Food Insecurity: Not on file  Transportation Needs: Not on file  Physical Activity: Not on file  Stress: Not on file  Social Connections: Not on file  Intimate Partner Violence: Not on file    Review of Systems   Difficult to obtain reliable history General: Negative for anorexia, weight loss, fever, chills, fatigue, weakness. Eyes: Negative for vision changes.  ENT: Negative for hoarseness, difficulty swallowing , nasal congestion. CV: Negative for chest pain, angina, palpitations, dyspnea on exertion, peripheral edema.  Respiratory: Negative for dyspnea at rest, dyspnea on exertion, cough, sputum, wheezing.  GI: See  history of present illness. GU:  Negative for dysuria, hematuria, urinary incontinence, urinary frequency, nocturnal urination.  MS: Negative for  joint pain, low back pain.  Derm: Negative for rash or itching.  Neuro: Negative for weakness, abnormal sensation, seizure, frequent headaches, memory loss,  confusion.  Psych: Negative for anxiety, depression, suicidal ideation, hallucinations.  Endo: Negative for unusual weight change.  Heme: Negative for bruising or bleeding. Allergy: Negative for rash or hives.  Physical Exam   BP 124/70 (BP Location: Right Arm, Patient Position: Sitting, Cuff Size: Large)   Pulse 78   Temp 98.1 F (36.7 C) (Oral)   Ht 5' 7 (1.702 m)   Wt 234 lb (106.1 kg)   SpO2 97%   BMI 36.65 kg/m    General: Well-nourished, well-developed in no acute distress.  Head: Normocephalic, atraumatic.   Eyes: Conjunctiva pink, no icterus. Mouth: Oropharyngeal mucosa moist and pink  Neck: Supple without thyromegaly, masses, or lymphadenopathy.  Lungs: Clear to auscultation bilaterally.  Heart: Regular rate and rhythm, no murmurs rubs or gallops.  Abdomen: Bowel sounds are normal, nontender, nondistended, no hepatosplenomegaly or masses,  no abdominal bruits or hernia, no rebound or guarding. Ostomy with healthy stoma. Green liquid stool.   Rectal: not performed Extremities: No lower extremity edema. No clubbing or deformities.  Neuro: Alert and oriented x 4 , grossly normal neurologically.  Skin: Warm and dry, no rash or jaundice.   Psych: Alert and cooperative, normal mood and affect.  Labs   See above  Imaging Studies   No results found.  Assessment/Plan:    Recurrent abdominal pain and nausea Intermittent abdominal pain and nausea post-total colectomy and ileostomy. Mostly right lower abdomen. Patient is a difficult historian due to her memory loss issues. She wakes up reporting abdominal pain around her ostomy. More recently less nausea issues. She has not followed up with general surgery in years. Cannot rule out parastomal hernia, other internal hernia, etc. Nausea could be secondary to gastric  ulcer, gastroparesis.   -CT A/P with oral contrast. No IV contrast due to CKD, low GFR.  - Consider endoscopy if CT is negative and symptoms persist, although at this time her dgt seems to think nausea is stable and mostly concerned that everything around her ostomy is ok.  -continue pantoprazole  40mg  daily before breakfast  Type 2 diabetes mellitus with poor glycemic control Poorly controlled type 2 diabetes with A1c of 11. Management complicated by memory issues. Dgt managing her insulin  pump remotely. Working with endocrinology. Nausea could be due to underlying gastroparesis.  -continue to strive for glycemic control -eat small meals/snacks throughout the day -do not overeat    Sonny RAMAN. Ezzard, MHS, PA-C [facility]

## 2024-02-06 NOTE — Telephone Encounter (Signed)
 [employer] PA: CPT Code 25822 Description: CT ABDOMEN & PELVIS W/ Case Number: [id number] Review Date: 02/06/2024 2:21:39 PM Expiration Date: N/A Status: This member's benefit plan did not require a prior authorization for this request.

## 2024-02-06 NOTE — Patient Instructions (Signed)
 Monitor symptoms of abdominal pain, nausea, poor oral intake.   Monitor weight every two weeks. Call with any weight loss greater than 10 pounds.  Ct scan to be scheduled. I have requested radiology to give oral contrast only.

## 2024-02-14 DIAGNOSIS — I10 Essential (primary) hypertension: Secondary | ICD-10-CM | POA: Diagnosis not present

## 2024-02-14 DIAGNOSIS — E1165 Type 2 diabetes mellitus with hyperglycemia: Secondary | ICD-10-CM | POA: Diagnosis not present

## 2024-02-14 DIAGNOSIS — E1122 Type 2 diabetes mellitus with diabetic chronic kidney disease: Secondary | ICD-10-CM | POA: Diagnosis not present

## 2024-02-14 DIAGNOSIS — E1142 Type 2 diabetes mellitus with diabetic polyneuropathy: Secondary | ICD-10-CM | POA: Diagnosis not present

## 2024-02-15 ENCOUNTER — Ambulatory Visit (HOSPITAL_BASED_OUTPATIENT_CLINIC_OR_DEPARTMENT_OTHER)

## 2024-02-15 DIAGNOSIS — I701 Atherosclerosis of renal artery: Secondary | ICD-10-CM | POA: Diagnosis not present

## 2024-02-15 DIAGNOSIS — R809 Proteinuria, unspecified: Secondary | ICD-10-CM | POA: Diagnosis not present

## 2024-02-15 DIAGNOSIS — E1122 Type 2 diabetes mellitus with diabetic chronic kidney disease: Secondary | ICD-10-CM | POA: Diagnosis not present

## 2024-02-15 DIAGNOSIS — N1832 Chronic kidney disease, stage 3b: Secondary | ICD-10-CM | POA: Diagnosis not present

## 2024-02-20 ENCOUNTER — Encounter (HOSPITAL_BASED_OUTPATIENT_CLINIC_OR_DEPARTMENT_OTHER): Payer: Self-pay

## 2024-02-21 ENCOUNTER — Ambulatory Visit (HOSPITAL_BASED_OUTPATIENT_CLINIC_OR_DEPARTMENT_OTHER)
Admission: RE | Admit: 2024-02-21 | Discharge: 2024-02-21 | Disposition: A | Discharge status: Home / Self Care | Source: Ambulatory Visit | Attending: Gastroenterology | Admitting: Gastroenterology

## 2024-02-21 DIAGNOSIS — Z9889 Other specified postprocedural states: Secondary | ICD-10-CM | POA: Insufficient documentation

## 2024-02-21 DIAGNOSIS — K8020 Calculus of gallbladder without cholecystitis without obstruction: Secondary | ICD-10-CM | POA: Diagnosis not present

## 2024-02-21 DIAGNOSIS — R1031 Right lower quadrant pain: Secondary | ICD-10-CM | POA: Insufficient documentation

## 2024-02-21 DIAGNOSIS — R110 Nausea: Secondary | ICD-10-CM | POA: Diagnosis not present

## 2024-02-21 DIAGNOSIS — K449 Diaphragmatic hernia without obstruction or gangrene: Secondary | ICD-10-CM | POA: Diagnosis not present

## 2024-03-06 ENCOUNTER — Ambulatory Visit: Payer: Self-pay | Admitting: Gastroenterology

## 2024-04-09 NOTE — Progress Notes (Signed)
 Referral done, pt is scheduled to see Dr. Laurel Monte at [phone] [place] for 06/02/24 at 2:30pm.  Spoke with the patient's daughter and gave her all of the information.  Everything sent over to the office of Dr. Monte.

## 2024-08-12 ENCOUNTER — Ambulatory Visit: Admitting: Nurse Practitioner

## 2024-08-14 ENCOUNTER — Ambulatory Visit: Admitting: Nurse Practitioner
